# Patient Record
Sex: Female | Born: 1970 | Race: White | Hispanic: No | Marital: Married | Smoking: Former smoker
Health system: Southern US, Community
[De-identification: ages and names within clinical notes are randomized; demographics above are authoritative.]

## PROBLEM LIST (undated history)

## (undated) DIAGNOSIS — M545 Low back pain, unspecified: Secondary | ICD-10-CM

## (undated) DIAGNOSIS — F419 Anxiety disorder, unspecified: Secondary | ICD-10-CM

## (undated) DIAGNOSIS — I5189 Other ill-defined heart diseases: Secondary | ICD-10-CM

## (undated) DIAGNOSIS — Z91041 Radiographic dye allergy status: Secondary | ICD-10-CM

## (undated) DIAGNOSIS — R002 Palpitations: Secondary | ICD-10-CM

## (undated) DIAGNOSIS — G43909 Migraine, unspecified, not intractable, without status migrainosus: Secondary | ICD-10-CM

## (undated) DIAGNOSIS — I2510 Atherosclerotic heart disease of native coronary artery without angina pectoris: Secondary | ICD-10-CM

## (undated) DIAGNOSIS — J45909 Unspecified asthma, uncomplicated: Secondary | ICD-10-CM

## (undated) DIAGNOSIS — M719 Bursopathy, unspecified: Secondary | ICD-10-CM

## (undated) DIAGNOSIS — I10 Essential (primary) hypertension: Secondary | ICD-10-CM

## (undated) DIAGNOSIS — G8929 Other chronic pain: Secondary | ICD-10-CM

## (undated) DIAGNOSIS — I340 Nonrheumatic mitral (valve) insufficiency: Secondary | ICD-10-CM

## (undated) DIAGNOSIS — M779 Enthesopathy, unspecified: Secondary | ICD-10-CM

## (undated) DIAGNOSIS — Z9289 Personal history of other medical treatment: Secondary | ICD-10-CM

## (undated) DIAGNOSIS — L509 Urticaria, unspecified: Secondary | ICD-10-CM

## (undated) DIAGNOSIS — D649 Anemia, unspecified: Secondary | ICD-10-CM

## (undated) HISTORY — DX: Other ill-defined heart diseases: I51.89

## (undated) HISTORY — DX: Enthesopathy, unspecified: M77.9

## (undated) HISTORY — DX: Palpitations: R00.2

## (undated) HISTORY — DX: Radiographic dye allergy status: Z91.041

## (undated) HISTORY — DX: Urticaria, unspecified: L50.9

## (undated) HISTORY — DX: Bursopathy, unspecified: M71.9

## (undated) HISTORY — DX: Personal history of other medical treatment: Z92.89

## (undated) HISTORY — DX: Anxiety disorder, unspecified: F41.9

## (undated) HISTORY — DX: Nonrheumatic mitral (valve) insufficiency: I34.0

---
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
## 2015-12-29 ENCOUNTER — Other Ambulatory Visit: Payer: Self-pay | Admitting: Adult Health

## 2015-12-29 DIAGNOSIS — G43919 Migraine, unspecified, intractable, without status migrainosus: Secondary | ICD-10-CM

## 2015-12-30 ENCOUNTER — Ambulatory Visit: Payer: Self-pay | Admitting: Primary Care

## 2016-01-03 ENCOUNTER — Emergency Department

## 2016-01-03 ENCOUNTER — Encounter: Payer: Self-pay | Admitting: Emergency Medicine

## 2016-01-03 DIAGNOSIS — R51 Headache: Secondary | ICD-10-CM | POA: Insufficient documentation

## 2016-01-03 NOTE — ED Notes (Signed)
Is scheduled for mri on Friday for migraines but states this does not feel like it. Also states feels like she has a bright light shining in her rt eye like she can't see

## 2016-01-03 NOTE — ED Notes (Signed)
States pain around eye, ear, jaw, head

## 2016-01-03 NOTE — ED Notes (Signed)
On and off x 1 week - took 2 motrin, 2 sudafed with no relief

## 2016-01-04 ENCOUNTER — Emergency Department
Admission: EM | Admit: 2016-01-04 | Discharge: 2016-01-04 | Disposition: A | Discharge status: Home / Self Care | Attending: Emergency Medicine | Admitting: Emergency Medicine

## 2016-01-04 NOTE — ED Notes (Signed)
No answer called from lobby

## 2016-01-04 NOTE — ED Notes (Signed)
No answer when called from lobby 

## 2016-01-15 ENCOUNTER — Ambulatory Visit
Admission: RE | Admit: 2016-01-15 | Discharge: 2016-01-15 | Disposition: A | Discharge status: Home / Self Care | Source: Ambulatory Visit | Attending: Adult Health | Admitting: Adult Health

## 2016-01-15 DIAGNOSIS — G43919 Migraine, unspecified, intractable, without status migrainosus: Secondary | ICD-10-CM | POA: Diagnosis present

## 2016-01-15 MED ORDER — GADOBENATE DIMEGLUMINE 529 MG/ML IV SOLN
15.0000 mL | Freq: Once | INTRAVENOUS | Status: AC | PRN
  Administered 2016-01-15: 13 mL via INTRAVENOUS

## 2016-08-05 ENCOUNTER — Encounter (INDEPENDENT_AMBULATORY_CARE_PROVIDER_SITE_OTHER): Payer: Self-pay

## 2016-08-05 ENCOUNTER — Encounter: Payer: Self-pay | Admitting: Cardiovascular Disease

## 2016-08-05 ENCOUNTER — Ambulatory Visit (INDEPENDENT_AMBULATORY_CARE_PROVIDER_SITE_OTHER): Admitting: Cardiovascular Disease

## 2016-08-05 VITALS — BP 138/68 | HR 57 | Ht 66.5 in | Wt 142.2 lb

## 2016-08-05 DIAGNOSIS — R0602 Shortness of breath: Secondary | ICD-10-CM

## 2016-08-05 DIAGNOSIS — R9431 Abnormal electrocardiogram [ECG] [EKG]: Secondary | ICD-10-CM | POA: Diagnosis not present

## 2016-08-05 DIAGNOSIS — R079 Chest pain, unspecified: Secondary | ICD-10-CM | POA: Diagnosis not present

## 2016-08-05 NOTE — Progress Notes (Signed)
Cardiology Office Note   Date:  08/05/2016   ID:  Kim Villarreal, DOB Jul 23, 1971, MRN [id number]  PCP:  [facility]  Cardiologist:   [name], MD   Chief Complaint  Patient presents with  . other    ABN EKG c/o irregular heart beat and bilateral arm pain. Meds reveiwed verbally with pt.      History of Present Illness: Kim Villarreal is a 45 y.o. female who Presents for evaluation of chest pain. She reports prior history of palpitations due to possible PVCs. Otherwise no previous cardiac history. She is a previous smoker and quit about 10 years ago. She smoked for 15 years. She does have family history of coronary artery disease. She has no diabetes or hyperlipidemia. She is known to have history of anxiety. She noticed elevated blood pressure readings over the summertime. The highest reading was 156/74 in June. She exercises regularly at the gym. Recently, she had an episode of sudden shortness of breath which happened about 2 weeks ago and resolved without intervention. She started having symptoms of substernal chest pain with exercise. The first episode happened on the elliptical and she thought it was musculoskeletal. The pain was substernal radiating to both elbows and described as aching sensation. It then happened again while she was exercising on a treadmill and lasted for about 10 minutes. She continued to exercise and the symptoms resolved. She does report worsening anxiety.     Past Medical History:  Diagnosis Date  . Bursitis   . Tendonitis     Past Surgical History:  Procedure Laterality Date  . CESAREAN SECTION       Current Outpatient Prescriptions  Medication Sig Dispense Refill  . ALPRAZolam (XANAX) 0.25 MG tablet Take 0.25 mg by mouth daily as needed.     . Cholecalciferol (VITAMIN D-1000 MAX ST) 1000 units tablet Take 2,000 Units by mouth daily.     Marland Kitchen docusate sodium (COLACE) 100 MG capsule Take 100 mg by mouth every other day.    Marland Kitchen  MAGNESIUM PO Take by mouth daily.    . nitrofurantoin (MACRODANTIN) 25 MG capsule Take 25 mg by mouth as needed.      No current facility-administered medications for this visit.     Allergies:   Codeine    Social History:  The patient  reports that she has quit smoking. She quit after 15.00 years of use. She has never used smokeless tobacco. She reports that she drinks alcohol. She reports that she does not use drugs.   Family History:  The patient's family history includes Heart disease in her mother.    ROS:  Please see the history of present illness.   Otherwise, review of systems are positive for none.   All other systems are reviewed and negative.    PHYSICAL EXAM: VS:  BP 138/68 (BP Location: Right Arm, Patient Position: Sitting, Cuff Size: Normal)   Pulse (!) 57   Ht 5' 6.5" (1.689 m)   Wt 142 lb 4 oz (64.5 kg)   BMI 22.62 kg/m  , BMI Body mass index is 22.62 kg/m. GEN: Well nourished, well developed, in no acute distress  HEENT: normal  Neck: no JVD, carotid bruits, or masses Cardiac: RRR; no  rubs, or gallops,no edema . One out of 6 systolic ejection murmur in the aortic area. Respiratory:  clear to auscultation bilaterally, normal work of breathing GI: soft, nontender, nondistended, + BS MS: no deformity or atrophy  Skin: warm  and dry, no rash Neuro:  Strength and sensation are intact Psych: euthymic mood, full affect   EKG:  EKG is ordered today. The ekg ordered today demonstrates normal sinus rhythm with left axis deviation and poor R-wave progression in the precordial leads   Recent Labs: No results found for requested labs within last 8760 hours.    Lipid Panel No results found for: CHOL, TRIG, HDL, CHOLHDL, VLDL, LDLCALC, LDLDIRECT    Wt Readings from Last 3 Encounters:  08/05/16 142 lb 4 oz (64.5 kg)      PAD Screen 08/05/2016  Previous PAD dx? No  Previous surgical procedure? No  Pain with walking? No  Feet/toe relief with dangling? No    Painful, non-healing ulcers? No  Extremities discolored? No      ASSESSMENT AND PLAN:  1.  Chest pain with some anginal and some atypical features. It is somewhat concerning that some of her symptoms are happening with exercise. However, the symptoms resolved with continued exercise. She has a soft systolic murmur in the aortic area by exam. Baseline ECG is mildly abnormal with poor R-wave progression in the anterior leads with left axis deviation. Given her risk factors for coronary artery disease, I recommend evaluation with a treadmill nuclear stress test. Due to her abnormal baseline ECG, treadmill stress test alone is not sufficient. She has a faint heart murmur and given her symptoms, I requested an echocardiogram. Anxiety induced chest pain is a possibility but that's a diagnosis of exclusion.  2. Elevated blood pressure: Continue to monitor and if the numbers continue to be abnormal, consider treatment.    Disposition:   FU with me as needed.   Signed,  [name], MD  08/05/2016 4:55 PM    [facility]

## 2016-08-05 NOTE — Patient Instructions (Addendum)
Medication Instructions:  Your physician recommends that you continue on your current medications as directed. Please refer to the Current Medication list given to you today.   Labwork: none  Testing/Procedures: Your physician has requested that you have an echocardiogram. Echocardiography is a painless test that uses sound waves to create images of your heart. It provides your doctor with information about the size and shape of your heart and how well your heart's chambers and valves are working. This procedure takes approximately one hour. There are no restrictions for this procedure.  Your physician has requested that you have a lexiscan myoview. For further information please visit HugeFiesta.[email or web address]. Please follow instruction sheet, as given.  [place]  Your caregiver has ordered a Stress Test with nuclear imaging. The purpose of this test is to evaluate the blood supply to your heart muscle. This procedure is referred to as a "Non-Invasive Stress Test." This is because other than having an IV started in your vein, nothing is inserted or "invades" your body. Cardiac stress tests are done to find areas of poor blood flow to the heart by determining the extent of coronary artery disease (CAD). Some patients exercise on a treadmill, which naturally increases the blood flow to your heart, while others who are  unable to walk on a treadmill due to physical limitations have a pharmacologic/chemical stress agent called Lexiscan . This medicine will mimic walking on a treadmill by temporarily increasing your coronary blood flow.   Please note: these test may take anywhere between 2-4 hours to complete  PLEASE REPORT TO [facility] AT THE FIRST DESK WILL DIRECT YOU WHERE TO GO  Date of Procedure:___Sept 21________________________  Arrival Time for Procedure:___8:15am___________  Instructions regarding medication:   You may take your morning medications with  a sip of water.   PLEASE NOTIFY THE OFFICE AT LEAST 4 HOURS IN ADVANCE IF YOU ARE UNABLE TO KEEP YOUR APPOINTMENT.  [phone] AND  PLEASE NOTIFY NUCLEAR MEDICINE AT [facility] AT LEAST 24 HOURS IN ADVANCE IF YOU ARE UNABLE TO KEEP YOUR APPOINTMENT. [phone]  How to prepare for your Myoview test:  1. Do not eat or drink after midnight 2. No caffeine for 24 hours prior to test 3. No smoking 24 hours prior to test. 4. Your medication may be taken with water.  If your doctor stopped a medication because of this test, do not take that medication. 5. Ladies, please do not wear dresses.  Skirts or pants are appropriate. Please wear a short sleeve shirt. 6. No perfume, cologne or lotion. 7. Wear comfortable walking shoes. No heels!            Follow-Up: Your physician recommends that you schedule a follow-up appointment as needed.    Any Other Special Instructions Will Be Listed Below (If Applicable).     If you need a refill on your cardiac medications before your next appointment, please call your pharmacy.  Echocardiogram An echocardiogram, or echocardiography, uses sound waves (ultrasound) to produce an image of your heart. The echocardiogram is simple, painless, obtained within a short period of time, and offers valuable information to your health care provider. The images from an echocardiogram can provide information such as:  Evidence of coronary artery disease (CAD).  Heart size.  Heart muscle function.  Heart valve function.  Aneurysm detection.  Evidence of a past heart attack.  Fluid buildup around the heart.  Heart muscle thickening.  Assess heart valve function. [place]  CARE PROVIDER KNOW ABOUT:  Any allergies you have.  All medicines you are taking, including vitamins, herbs, eye drops, creams, and over-the-counter medicines.  Previous problems you or members of your family have had with the use of anesthetics.  Any blood disorders you  have.  Previous surgeries you have had.  Medical conditions you have.  Possibility of pregnancy, if this applies. BEFORE THE PROCEDURE  No special preparation is needed. Eat and drink normally.  PROCEDURE   In order to produce an image of your heart, gel will be applied to your chest and a wand-like tool (transducer) will be moved over your chest. The gel will help transmit the sound waves from the transducer. The sound waves will harmlessly bounce off your heart to allow the heart images to be captured in real-time motion. These images will then be recorded.  You may need an IV to receive a medicine that improves the quality of the pictures. AFTER THE PROCEDURE You may return to your normal schedule including diet, activities, and medicines, unless your health care provider tells you otherwise.   This information is not intended to replace advice given to you by your health care provider. Make sure you discuss any questions you have with your health care provider.   Document Released: 11/04/2000 Document Revised: 11/28/2014 Document Reviewed: 07/15/2013 Elsevier Interactive Patient Education 2016 [place]. Cardiac Nuclear Scanning A cardiac nuclear scan is used to check your heart for problems, such as the following:  A portion of the heart is not getting enough blood.  Part of the heart muscle has died, which happens with a heart attack.  The heart wall is not working normally.  In this test, a radioactive dye (tracer) is injected into your bloodstream. After the tracer has traveled to your heart, a scanning device is used to measure how much of the tracer is absorbed by or distributed to various areas of your heart. LET [facility] CARE PROVIDER KNOW ABOUT:  Any allergies you have.  All medicines you are taking, including vitamins, herbs, eye drops, creams, and over-the-counter medicines.  Previous problems you or members of your family have had with the use of  anesthetics.  Any blood disorders you have.  Previous surgeries you have had.  Medical conditions you have.  RISKS AND COMPLICATIONS Generally, this is a safe procedure. However, as with any procedure, problems can occur. Possible problems include:   Serious chest pain.  Rapid heartbeat.  Sensation of warmth in your chest. This usually passes quickly. BEFORE THE PROCEDURE Ask your health care provider about changing or stopping your regular medicines. PROCEDURE This procedure is usually done at a hospital and takes 2-4 hours.  An IV tube is inserted into one of your veins.  Your health care provider will inject a small amount of radioactive tracer through the tube.  You will then wait for 20-40 minutes while the tracer travels through your bloodstream.  You will lie down on an exam table so images of your heart can be taken. Images will be taken for about 15-20 minutes.  You will exercise on a treadmill or stationary bike. While you exercise, your heart activity will be monitored with an electrocardiogram (ECG), and your blood pressure will be checked.  If you are unable to exercise, you may be given a medicine to make your heart beat faster.  When blood flow to your heart has peaked, tracer will again be injected through the IV tube.  After 20-40 minutes, you will  get back on the exam table and have more images taken of your heart.  When the procedure is over, your IV tube will be removed. AFTER THE PROCEDURE  You will likely be able to leave shortly after the test. Unless your health care provider tells you otherwise, you may return to your normal schedule, including diet, activities, and medicines.  Make sure you find out how and when you will get your test results.   This information is not intended to replace advice given to you by your health care provider. Make sure you discuss any questions you have with your health care provider.   Document Released: 12/02/2004  Document Revised: 11/12/2013 Document Reviewed: 10/16/2013 Elsevier Interactive Patient Education [employer].

## 2016-08-09 ENCOUNTER — Ambulatory Visit: Admitting: Internal Medicine

## 2016-08-11 ENCOUNTER — Other Ambulatory Visit: Payer: Self-pay

## 2016-08-11 ENCOUNTER — Encounter
Admission: RE | Admit: 2016-08-11 | Discharge: 2016-08-11 | Disposition: A | Discharge status: Home / Self Care | Source: Ambulatory Visit | Attending: Cardiovascular Disease | Admitting: Cardiovascular Disease

## 2016-08-11 DIAGNOSIS — I10 Essential (primary) hypertension: Secondary | ICD-10-CM

## 2016-08-11 DIAGNOSIS — R079 Chest pain, unspecified: Secondary | ICD-10-CM

## 2016-08-11 LAB — NM MYOCAR MULTI W/SPECT W/WALL MOTION / EF
CHL CUP NUCLEAR SRS: 2
CHL CUP RESTING HR STRESS: 60 {beats}/min
CHL CUP STRESS STAGE 1 GRADE: 0.1 %
CHL CUP STRESS STAGE 1 SPEED: 0 mph
CHL CUP STRESS STAGE 2 DBP: 62 mmHg
CHL CUP STRESS STAGE 2 HR: 97 {beats}/min
CHL CUP STRESS STAGE 3 DBP: 59 mmHg
CHL CUP STRESS STAGE 3 SBP: 201 mmHg
CHL CUP STRESS STAGE 3 SPEED: 2.5 mph
CHL CUP STRESS STAGE 4 DBP: 72 mmHg
CHL CUP STRESS STAGE 4 GRADE: 14 %
CHL CUP STRESS STAGE 5 SPEED: 0 mph
CHL CUP STRESS STAGE 6 GRADE: 0 %
CHL CUP STRESS STAGE 6 HR: 76 {beats}/min
CHL CUP STRESS STAGE 6 SBP: 161 mmHg
CSEPED: 9 min
CSEPEDS: 0 s
CSEPHR: 89 %
CSEPPBP: 219 mmHg
CSEPPHR: 157 {beats}/min
Estimated workload: 10.1 METS
LV dias vol: 68 mL (ref 46–106)
LVSYSVOL: 17 mL
NUC STRESS TID: 0.85
Percent of predicted max HR: 89 %
SDS: 0
SSS: 2
Stage 1 HR: 61 {beats}/min
Stage 2 Grade: 10 %
Stage 2 SBP: 167 mmHg
Stage 2 Speed: 1.7 mph
Stage 3 Grade: 12 %
Stage 3 HR: 126 {beats}/min
Stage 4 HR: 157 {beats}/min
Stage 4 SBP: 219 mmHg
Stage 4 Speed: 3.4 mph
Stage 5 Grade: 0 %
Stage 5 HR: 116 {beats}/min
Stage 6 DBP: 87 mmHg
Stage 6 Speed: 0 mph

## 2016-08-11 MED ORDER — TECHNETIUM TC 99M TETROFOSMIN IV KIT
12.9200 | PACK | Freq: Once | INTRAVENOUS | Status: AC | PRN
  Administered 2016-08-11: 12.92 via INTRAVENOUS

## 2016-08-11 MED ORDER — LOSARTAN POTASSIUM 25 MG PO TABS: 25.0000 mg | ORAL_TABLET | Freq: Every day | ORAL | 3 refills | Status: DC

## 2016-08-11 MED ORDER — TECHNETIUM TC 99M TETROFOSMIN IV KIT
31.4520 | PACK | Freq: Once | INTRAVENOUS | Status: AC | PRN
  Administered 2016-08-11: 31.452 via INTRAVENOUS

## 2016-08-12 ENCOUNTER — Telehealth: Payer: Self-pay

## 2016-08-12 NOTE — Telephone Encounter (Signed)
lmom to have pt call and schedule appt

## 2016-08-12 NOTE — Telephone Encounter (Signed)
-----   Message from Georgiana Shore, RN sent at 08/11/2016  5:30 PM EDT ----- Could you please call her for 2 month f/u w/Dr. Fletcher Anon? Pt aware you guys will be calling. Thanks!

## 2016-08-22 ENCOUNTER — Other Ambulatory Visit

## 2016-08-24 ENCOUNTER — Other Ambulatory Visit: Payer: Self-pay

## 2016-08-24 ENCOUNTER — Other Ambulatory Visit

## 2016-08-24 ENCOUNTER — Ambulatory Visit (INDEPENDENT_AMBULATORY_CARE_PROVIDER_SITE_OTHER)

## 2016-08-24 DIAGNOSIS — R0602 Shortness of breath: Secondary | ICD-10-CM

## 2016-08-29 ENCOUNTER — Other Ambulatory Visit

## 2016-08-30 ENCOUNTER — Telehealth: Payer: Self-pay | Admitting: Cardiovascular Disease

## 2016-08-30 NOTE — Telephone Encounter (Signed)
Losartan added for elevated BP during treadmill myoview. Pt did not show for f/u 10/9 labs. Left detailed message regarding rescheduling labs along w/CB number on pt cell VM

## 2016-09-22 ENCOUNTER — Telehealth: Payer: Self-pay | Admitting: Cardiovascular Disease

## 2016-09-22 NOTE — Telephone Encounter (Signed)
Pt states every time she does activity she has severe chest pain that shoots down both arms and into her neck an down her back. States she only has this pain when she exercises. Please call.

## 2016-09-22 NOTE — Telephone Encounter (Signed)
S/w pt who reports pains from tip of fingers, radiating through both arms, across chest and down her back anytime she exerts herself. Sx resolve w/rest.   She was seen in the office Sept 15 for chest pain that would begin during exertion but would resolve during exercise. Echo and myoview ordered. BP was elevated during myoview and losartan 25mg  qd added. She has been taking this until 11/29 when she d/c'd it as she thought this was contributing to her refux. When reflux sx did not improve, she resumed losartan. BP 120s/80s w/medication.  She was cleared to resume normal activities on 10/5 by Dr. Fletcher Anon The pains in her arms, chest, and back are getting progressively worse. States stress makes it worse. PCP thought it to be inflammation in her cartilage but pt states she is sure it can not be this and did not want prednisone that MD recommended She took prilosec x 10 days with no improvement.  She has "several attacks Saturday, sweating from head to toe lasting a long time, about 1/2 hour each." She would like a sooner appt and is agreeable to see PA/NP. Reviewed s/s that would require immediate attention in an ER setting. She verbalized understanding and is agreeable w/plan. Forward to scheduling for sooner appt.

## 2016-09-26 ENCOUNTER — Other Ambulatory Visit

## 2016-09-26 DIAGNOSIS — I10 Essential (primary) hypertension: Secondary | ICD-10-CM

## 2016-09-27 LAB — BASIC METABOLIC PANEL
BUN / CREAT RATIO: 10 (ref 9–23)
BUN: 9 mg/dL (ref 6–24)
CO2: 23 mmol/L (ref 18–29)
CREATININE: 0.86 mg/dL (ref 0.57–1.00)
Calcium: 10.2 mg/dL (ref 8.7–10.2)
Chloride: 101 mmol/L (ref 96–106)
GFR, EST AFRICAN AMERICAN: 95 mL/min/{1.73_m2} (ref >59)
GFR, EST NON AFRICAN AMERICAN: 82 mL/min/{1.73_m2} (ref >59)
Glucose: 101 mg/dL — ABNORMAL HIGH (ref 65–99)
POTASSIUM: 4.9 mmol/L (ref 3.5–5.2)
SODIUM: 141 mmol/L (ref 134–144)

## 2016-09-29 ENCOUNTER — Encounter: Payer: Self-pay | Admitting: Physician Assistant

## 2016-10-03 ENCOUNTER — Other Ambulatory Visit
Admission: RE | Admit: 2016-10-03 | Discharge: 2016-10-03 | Disposition: A | Discharge status: Home / Self Care | Source: Ambulatory Visit | Attending: Physician Assistant | Admitting: Physician Assistant

## 2016-10-03 ENCOUNTER — Encounter: Payer: Self-pay | Admitting: Physician Assistant

## 2016-10-03 ENCOUNTER — Other Ambulatory Visit: Payer: Self-pay | Admitting: Physician Assistant

## 2016-10-03 ENCOUNTER — Ambulatory Visit (INDEPENDENT_AMBULATORY_CARE_PROVIDER_SITE_OTHER): Admitting: Physician Assistant

## 2016-10-03 VITALS — BP 124/60 | HR 78 | Ht 66.5 in | Wt 141.5 lb

## 2016-10-03 DIAGNOSIS — Z01810 Encounter for preprocedural cardiovascular examination: Secondary | ICD-10-CM

## 2016-10-03 DIAGNOSIS — R079 Chest pain, unspecified: Secondary | ICD-10-CM

## 2016-10-03 DIAGNOSIS — Z79899 Other long term (current) drug therapy: Secondary | ICD-10-CM | POA: Diagnosis not present

## 2016-10-03 DIAGNOSIS — I10 Essential (primary) hypertension: Secondary | ICD-10-CM | POA: Diagnosis not present

## 2016-10-03 DIAGNOSIS — Z01812 Encounter for preprocedural laboratory examination: Secondary | ICD-10-CM | POA: Diagnosis present

## 2016-10-03 DIAGNOSIS — I200 Unstable angina: Secondary | ICD-10-CM | POA: Insufficient documentation

## 2016-10-03 LAB — PROTIME-INR
INR: 0.93
PROTHROMBIN TIME: 12.5 s (ref 11.4–15.2)

## 2016-10-03 LAB — BASIC METABOLIC PANEL
Anion gap: 9 (ref 5–15)
BUN: 14 mg/dL (ref 6–20)
CALCIUM: 10.1 mg/dL (ref 8.9–10.3)
CHLORIDE: 101 mmol/L (ref 101–111)
CO2: 29 mmol/L (ref 22–32)
CREATININE: 0.86 mg/dL (ref 0.44–1.00)
GFR calc Af Amer: >60 mL/min (ref >60)
GFR calc non Af Amer: >60 mL/min (ref >60)
GLUCOSE: 106 mg/dL — AB (ref 65–99)
Potassium: 3.7 mmol/L (ref 3.5–5.1)
Sodium: 139 mmol/L (ref 135–145)

## 2016-10-03 LAB — CBC WITH DIFFERENTIAL/PLATELET
Basophils Absolute: 0.1 10*3/uL (ref 0–0.1)
Basophils Relative: 1 %
Eosinophils Absolute: 0.1 10*3/uL (ref 0–0.7)
Eosinophils Relative: 1 %
HEMATOCRIT: 44.1 % (ref 35.0–47.0)
HEMOGLOBIN: 14.7 g/dL (ref 12.0–16.0)
LYMPHS ABS: 1.8 10*3/uL (ref 1.0–3.6)
LYMPHS PCT: 20 %
MCH: 27.4 pg (ref 26.0–34.0)
MCHC: 33.3 g/dL (ref 32.0–36.0)
MCV: 82.4 fL (ref 80.0–100.0)
MONO ABS: 0.6 10*3/uL (ref 0.2–0.9)
MONOS PCT: 6 %
NEUTROS ABS: 6.5 10*3/uL (ref 1.4–6.5)
Neutrophils Relative %: 72 %
Platelets: 177 10*3/uL (ref 150–440)
RBC: 5.35 MIL/uL — ABNORMAL HIGH (ref 3.80–5.20)
RDW: 14.3 % (ref 11.5–14.5)
WBC: 8.9 10*3/uL (ref 3.6–11.0)

## 2016-10-03 MED ORDER — ASPIRIN EC 81 MG PO TBEC: 81.0000 mg | DELAYED_RELEASE_TABLET | Freq: Every day | ORAL | 3 refills | Status: DC

## 2016-10-03 MED ORDER — ISOSORBIDE MONONITRATE ER 30 MG PO TB24: 15.0000 mg | ORAL_TABLET | Freq: Every day | ORAL | 3 refills | Status: DC

## 2016-10-03 NOTE — Progress Notes (Signed)
Cardiac cath orders placed. Please see OV from 10/03/16.

## 2016-10-03 NOTE — Patient Instructions (Addendum)
Medication Instructions:  Your physician has recommended you make the following change in your medication:  START taking Imdur 15mg  once daily START taking aspirin 81mg  once daily   Labwork: BMET, CBC, PT/INR  Testing/Procedures: A chest x-ray takes a picture of the organs and structures inside the chest, including the heart, lungs, and blood vessels. This test can show several things, including, whether the heart is enlarges; whether fluid is building up in the lungs; and whether pacemaker / defibrillator leads are still in place.  Your physician has requested that you have a cardiac catheterization. Cardiac catheterization is used to diagnose and/or treat various heart conditions. Doctors may recommend this procedure for a number of different reasons. The most common reason is to evaluate chest pain. Chest pain can be a symptom of coronary artery disease (CAD), and cardiac catheterization can show whether plaque is narrowing or blocking your heart's arteries. This procedure is also used to evaluate the valves, as well as measure the blood flow and oxygen levels in different parts of your heart. For further information please visit HugeFiesta.tn. Please follow instruction sheet, as given.  [facility] Cardiac Cath Instructions   You are scheduled for a Cardiac Cath on: Wednesday, November 15  Please arrive at 11am on the day of your procedure  Please expect a call from our [place] to pre-register you  Do not eat/drink anything after midnight  Someone will need to drive you home  It is recommended someone be with you for the first 24 hours after your procedure  Wear clothes that are easy to get on/off and wear slip on shoes if possible   Medications bring a current list of all medications with you    _xx__ Do not take these medications before your procedure: Do not take losartan the morning of your procedure.   Day of your procedure: [facility] [place] [phone]  Arrival date: Wednesday, November 15 Arrival time:   The usual length of stay after your procedure is about 2 to 3 hours.  This can vary.  If you have any questions, please call our office at [phone], or you may call the cardiac cath lab at [facility] directly at [phone]  Follow-Up: Your physician recommends that you schedule a follow-up appointment 1-2 weeks after cath.    Any Other Special Instructions Will Be Listed Below (If Applicable).     If you need a refill on your cardiac medications before your next appointment, please call your pharmacy.   Angiogram An angiogram, also called angiography, is a procedure used to look at the blood vessels. In this procedure, dye is injected through a long, thin tube (catheter) into an artery. X-rays are then taken. The X-rays will show if there is a blockage or problem in a blood vessel.  LET [facility] CARE PROVIDER KNOW ABOUT:  Any allergies you have, including allergies to shellfish or contrast dye.   All medicines you are taking, including vitamins, herbs, eye drops, creams, and over-the-counter medicines.   Previous problems you or members of your family have had with the use of anesthetics.   Any blood disorders you have.   Previous surgeries you have had.  Any previous kidney problems or failure you have had.  Medical conditions you have.   Possibility of pregnancy, if this applies. RISKS AND COMPLICATIONS Generally, an angiogram is a safe procedure. However, as with any procedure, problems can occur. Possible problems include:  Injury to the  blood vessels, including rupture or bleeding.  Infection or bruising at the catheter site.  Allergic reaction to the dye or contrast used.  Kidney damage from the dye or contrast used.  Blood clots that can lead to a stroke or heart attack. BEFORE THE PROCEDURE  Do not eat or drink after midnight on the  night before the procedure, or as directed by your health care provider.   Ask your health care provider if you may drink enough water to take any needed medicines the morning of the procedure.  PROCEDURE  You may be given a medicine to help you relax (sedative) before and during the procedure. This medicine is given through an IV access tube that is inserted into one of your veins.   The area where the catheter will be inserted will be washed and shaved. This is usually done in the groin but may be done in the fold of your arm (near your elbow) or in the wrist.  A medicine will be given to numb the area where the catheter will be inserted (local anesthetic).  The catheter will be inserted with a guide wire into an artery. The catheter is guided by using a type of X-ray (fluoroscopy) to the blood vessel being examined.   Dye is then injected into the catheter, and X-rays are taken. The dye helps to show where any narrowing or blockages are located.  AFTER THE PROCEDURE   If the procedure is done through the leg, you will be kept in bed lying flat for several hours. You will be instructed to not bend or cross your legs.  The insertion site will be checked frequently.  The pulse in your feet or wrist will be checked frequently.  Additional blood tests, X-rays, and electrocardiography may be done.   You may need to stay in the hospital overnight for observation.    This information is not intended to replace advice given to you by your health care provider. Make sure you discuss any questions you have with your health care provider.   Document Released: 08/17/2005 Document Revised: 11/28/2014 Document Reviewed: 04/10/2013 Elsevier Interactive Patient Education 2016 [place] After These instructions give you information about caring for yourself after your procedure. Your doctor may also give you more specific instructions. Call your doctor if you have any  problems or questions after your procedure.  HOME CARE  Take medicines only as told by your doctor.  Follow your doctor's instructions about:  Care of the area where the tube was inserted.  Bandage (dressing) changes and removal.  You may shower 24-48 hours after the procedure or as told by your doctor.  Do not take baths, swim, or use a hot tub until your doctor approves.  Every day, check the area where the tube was inserted. Watch for:  Redness, swelling, or pain.  Fluid, blood, or pus.  Do not apply powder or lotion to the site.  Do not lift anything that is heavier than 10 lb (4.5 kg) for 5 days or as told by your doctor.  Ask your doctor when you can:  Return to work or school.  Do physical activities or play sports.  Have sex.  Do not drive or operate heavy machinery for 24 hours or as told by your doctor.  Have someone with you for the first 24 hours after the procedure.  Keep all follow-up visits as told by your doctor. This is important. GET HELP IF:  You have  a fever.   You have chills.   You have more bleeding from the area where the tube was inserted. Hold pressure on the area.  You have redness, swelling, or pain in the area where the tube was inserted.  You have fluid or pus coming from the area. GET HELP RIGHT AWAY IF:   You have a lot of pain in the area where the tube was inserted.  The area where the tube was inserted is bleeding, and the bleeding does not stop after 30 minutes of holding steady pressure on the area.  The area near or just beyond the insertion site becomes pale, cool, tingly, or numb.   This information is not intended to replace advice given to you by your health care provider. Make sure you discuss any questions you have with your health care provider.   Document Released: 02/03/2009 Document Revised: 11/28/2014 Document Reviewed: 04/10/2013 Elsevier Interactive Patient Education [employer].

## 2016-10-03 NOTE — Progress Notes (Signed)
Cardiology Office Note Date:  10/03/2016  Patient ID:  Kim Villarreal, DOB Feb 22, 1971, MRN [id number] PCP:  [place]  Cardiologist:  Dr. Fletcher Anon, MD    Chief Complaint: Follow up chest pain  History of Present Illness: Kim Villarreal is a 45 y.o. female with history of palpitations with possible PVCs, prior tobacco abuse beginning at age 67 quitting approximately 10 years ago, anxiety, and family history of CAD who was recently seen on 08/05/2016 for evaulation of chest pain with exercise and SOB. Pain that time was substernal and radiated down her bilateral arms to her elbows. BP had been somewhat elevated in the Q000111Q systolic range. She also noted increased anxiety at the same time. She underwent echo on 08/24/16 that showed a vigorous LV systolic function with an EF of 65-70%, normal wall motion, normal LV diastolic function, and mild mitral regurgitation. She also underwent a treadmill Myoview on 08/11/2016 that showed no significant ischemia, normal wall motion, no EKG changes concerning for ischemia, small region of fixed perfusion defect in the mid to apical anteroseptal region c/w breast attenuation artifact, peak BP of 219/72, overall low-risk study. She was started on losartan given elevated BP. She followed up with PCP on 10/25 noting continued chest pain that involved her entire chest and radiated down her arms. There was associated decreased appetite. Pain was worse with movement and had tried Prilosec OTC without relief. Thought was possible MSK etiology and she was prescribed prednisone, though the patient did not take this. CXR through PCP was reportedly negative (unable to see overread in Care Everywhere). She has continued to have chest pain with associated diaphoresis. BP has been well controlled since starting losartan. She has an appointment with GI in early December.  She has noted progressive worsening of chest pain that is now much worse with exertion and as well as  occurring as rest and when laying supine. She notes dull chest pain along her upper chest that radiates along both sides of her chest and down her bilateral arms. Previously with some associated diaphoresis, though none recently. When exercising pain will develop within one minute of exercise and last until she rests. Pain will improve with approximately 1 minute of rest. When she has pain at rest now it will last approximately 5 minutes. PPI, H2 blockers, or TUMs have not helped. Currently without chest pain.    Past Medical History:  Diagnosis Date  . Anxiety   . Bursitis   . History of nuclear stress test    a. 07/2016: no sig ischemia, nl wall motion, EF 72%, small defect along mid to apical anteroseptal wall c/w breast attenuation artifact, low risk study  . Mitral regurgitation    a. echo 08/2016: vigorous EF 65-70%, nl wall motion, nl LV diastolic fxn, mild MR  . Tendonitis     Past Surgical History:  Procedure Laterality Date  . CESAREAN SECTION      Current Outpatient Prescriptions  Medication Sig Dispense Refill  . Cholecalciferol (VITAMIN D-1000 MAX ST) 1000 units tablet Take 2,000 Units by mouth daily.     Marland Kitchen losartan (COZAAR) 25 MG tablet Take 1 tablet (25 mg total) by mouth daily. 30 tablet 3  . magnesium gluconate (MAGONATE) 1000 (54 Mg) MG/5ML syrup Take 400 mg by mouth daily before breakfast.    . nitrofurantoin (MACRODANTIN) 25 MG capsule Take 25 mg by mouth as needed.      No current facility-administered medications for this visit.  Allergies:   Codeine   Social History:  The patient  reports that she has quit smoking. She quit after 15.00 years of use. She has never used smokeless tobacco. She reports that she drinks alcohol. She reports that she does not use drugs.   Family History:  The patient's family history includes Heart disease in her mother.  ROS:   Review of Systems  Constitutional: Positive for diaphoresis and malaise/fatigue. Negative for chills,  fever and weight loss.  HENT: Negative for congestion.   Eyes: Negative for discharge and redness.  Respiratory: Negative for cough, hemoptysis, sputum production, shortness of breath and wheezing.   Cardiovascular: Positive for chest pain and palpitations. Negative for orthopnea, claudication, leg swelling and PND.  Gastrointestinal: Negative for abdominal pain, blood in stool, heartburn, melena, nausea and vomiting.  Genitourinary: Negative for hematuria.  Musculoskeletal: Negative for falls and myalgias.  Skin: Negative for rash.  Neurological: Positive for weakness. Negative for dizziness, tingling, tremors, sensory change, speech change, focal weakness and loss of consciousness.  Endo/Heme/Allergies: Does not bruise/bleed easily.  Psychiatric/Behavioral: Negative for substance abuse. The patient is not nervous/anxious.   All other systems reviewed and are negative.    PHYSICAL EXAM:  VS:  BP 124/60 (BP Location: Left Arm, Patient Position: Sitting, Cuff Size: Normal)   Pulse 78   Ht 5' 6.5" (1.689 m)   Wt 141 lb 8 oz (64.2 kg)   BMI 22.50 kg/m  BMI: Body mass index is 22.5 kg/m.  Physical Exam  Constitutional: She is oriented to person, place, and time. She appears well-developed and well-nourished.  HENT:  Head: Normocephalic and atraumatic.  Eyes: Right eye exhibits no discharge. Left eye exhibits no discharge.  Neck: Normal range of motion. No JVD present.  Cardiovascular: Normal rate, regular rhythm, S1 normal, S2 normal and normal heart sounds.  Exam reveals no distant heart sounds, no friction rub, no midsystolic click and no opening snap.   No murmur heard. Pulmonary/Chest: Effort normal and breath sounds normal. No respiratory distress. She has no decreased breath sounds. She has no wheezes. She has no rales. She exhibits no tenderness.  Abdominal: Soft. She exhibits no distension. There is no tenderness.  Musculoskeletal: She exhibits no edema.  Neurological: She is  alert and oriented to person, place, and time.  Skin: Skin is warm and dry. No cyanosis. Nails show no clubbing.  Psychiatric: She has a normal mood and affect. Her speech is normal and behavior is normal. Judgment and thought content normal.    EKG:  Was ordered and interpreted by me today. Shows NSR, 76 bpm, nonspecific lateral st/t changes   Recent Labs: 09/26/2016: BUN 9; Creatinine, Ser 0.86; Potassium 4.9; Sodium 141  No results found for requested labs within last 8760 hours.   Estimated Creatinine Clearance: 79.7 mL/min (by C-G formula based on SCr of 0.86 mg/dL).   Wt Readings from Last 3 Encounters:  10/03/16 141 lb 8 oz (64.2 kg)  08/05/16 142 lb 4 oz (64.5 kg)     Other studies reviewed: Additional studies/records reviewed today include: summarized above  ASSESSMENT AND PLAN:  1. Unstable angina: Symptoms progressive to now occurring at rest and possibly with angina decubitus. Symptoms continue to worsen and occur daily at this time with some episodes of 10/10 pain that radiate down the bilateral arms. Given persistent to worsening symptoms since her treadmill Myoview we discussed further evaluation options including cardiac cath and coronary CT. It was decided upon for her to undergo LHC  at [facility] with Dr. Fletcher Anon, MD. Start ASA 81 mg daily and Imdur 15 mg daily. Should she develop headache with Imdur will change to Ranexa. Discussed with Dr. Fletcher Anon, MD. Risks and benefits of cardiac catheterization have been discussed with the patient including risks of bleeding, bruising, infection, kidney damage, stroke, heart attack, and death. The patient understands these risks and is willing to proceed with the procedure. All questions have been answered and concerns listened to.   2. HTN: Well controlled. Continue losartan 25 mg daily. Add Imdur 15 mg daily as above.   Disposition: F/u with Dr. Fletcher Anon, MD or myself s/p LHC.    Current medicines are reviewed at length with the patient today.   The patient did not have any concerns regarding medicines.  Melvern Banker PA-C 10/03/2016 2:52 PM     [place], [address] [address] [phone]

## 2016-10-04 ENCOUNTER — Telehealth: Payer: Self-pay | Admitting: Physician Assistant

## 2016-10-04 ENCOUNTER — Encounter: Payer: Self-pay | Admitting: Physician Assistant

## 2016-10-04 NOTE — Progress Notes (Signed)
CXR reviewed from 08/2016. Negative. [place] for cardiac cath.

## 2016-10-04 NOTE — Telephone Encounter (Addendum)
Pt in office yesterday. Scheduled cardiac catheterization on Nov 15. Pt stated she had CXR October 2018 at [facility] [place]. Lars Pinks, RN s/w Anderson [place] at [facility] on November 13 and requested CXR to be faxed to our office. I s/w Jillian today as we did not receive information. She states the request was sent to Adonis Huguenin, RN, in their office but they will also need a faxed request. Explained need for this information today to [facility] who verbalized understanding. Faxed request to [phone].  CXR received and scanned in to patient chart by Caleen Essex Message to Christell Faith, PA-C

## 2016-10-05 ENCOUNTER — Encounter (HOSPITAL_COMMUNITY)
Admission: RE | Disposition: A | Discharge status: Home / Self Care | Payer: Self-pay | Source: Ambulatory Visit | Attending: Cardiovascular Disease

## 2016-10-05 ENCOUNTER — Encounter (HOSPITAL_COMMUNITY): Payer: Self-pay | Admitting: General Practice

## 2016-10-05 ENCOUNTER — Ambulatory Visit (HOSPITAL_COMMUNITY)
Admission: RE | Admit: 2016-10-05 | Discharge: 2016-10-06 | Disposition: A | Discharge status: Home / Self Care | Source: Ambulatory Visit | Attending: Cardiovascular Disease | Admitting: Cardiovascular Disease

## 2016-10-05 DIAGNOSIS — Z87891 Personal history of nicotine dependence: Secondary | ICD-10-CM | POA: Diagnosis not present

## 2016-10-05 DIAGNOSIS — Z8249 Family history of ischemic heart disease and other diseases of the circulatory system: Secondary | ICD-10-CM | POA: Diagnosis not present

## 2016-10-05 DIAGNOSIS — Z79899 Other long term (current) drug therapy: Secondary | ICD-10-CM | POA: Diagnosis not present

## 2016-10-05 DIAGNOSIS — I10 Essential (primary) hypertension: Secondary | ICD-10-CM

## 2016-10-05 DIAGNOSIS — I25110 Atherosclerotic heart disease of native coronary artery with unstable angina pectoris: Secondary | ICD-10-CM | POA: Diagnosis not present

## 2016-10-05 DIAGNOSIS — I340 Nonrheumatic mitral (valve) insufficiency: Secondary | ICD-10-CM | POA: Diagnosis not present

## 2016-10-05 DIAGNOSIS — I200 Unstable angina: Secondary | ICD-10-CM

## 2016-10-05 DIAGNOSIS — Z955 Presence of coronary angioplasty implant and graft: Secondary | ICD-10-CM

## 2016-10-05 DIAGNOSIS — I2510 Atherosclerotic heart disease of native coronary artery without angina pectoris: Secondary | ICD-10-CM

## 2016-10-05 HISTORY — DX: Migraine, unspecified, not intractable, without status migrainosus: G43.909

## 2016-10-05 HISTORY — PX: CARDIAC CATHETERIZATION: SHX172

## 2016-10-05 HISTORY — DX: Atherosclerotic heart disease of native coronary artery without angina pectoris: I25.10

## 2016-10-05 HISTORY — DX: Anemia, unspecified: D64.9

## 2016-10-05 HISTORY — DX: Essential (primary) hypertension: I10

## 2016-10-05 LAB — HCG, SERUM, QUALITATIVE: Preg, Serum: NEGATIVE

## 2016-10-05 LAB — POCT ACTIVATED CLOTTING TIME: Activated Clotting Time: 538 seconds

## 2016-10-05 SURGERY — LEFT HEART CATH AND CORONARY ANGIOGRAPHY
Anesthesia: Moderate Sedation

## 2016-10-05 SURGERY — LEFT HEART CATH AND CORONARY ANGIOGRAPHY

## 2016-10-05 MED ORDER — DIPHENHYDRAMINE HCL 50 MG/ML IJ SOLN
INTRAMUSCULAR | Status: AC
  Filled 2016-10-05: qty 1

## 2016-10-05 MED ORDER — FENTANYL CITRATE (PF) 100 MCG/2ML IJ SOLN
INTRAMUSCULAR | Status: DC | PRN
  Administered 2016-10-05: 50 ug via INTRAVENOUS
  Administered 2016-10-05: 25 ug via INTRAVENOUS

## 2016-10-05 MED ORDER — CLOPIDOGREL BISULFATE 300 MG PO TABS
ORAL_TABLET | ORAL | Status: AC
  Filled 2016-10-05: qty 1

## 2016-10-05 MED ORDER — CLOPIDOGREL BISULFATE 300 MG PO TABS
ORAL_TABLET | ORAL | Status: DC | PRN
  Administered 2016-10-05: 600 mg via ORAL

## 2016-10-05 MED ORDER — ONDANSETRON HCL 4 MG/2ML IJ SOLN: 4.0000 mg | Freq: Four times a day (QID) | INTRAMUSCULAR | Status: DC | PRN

## 2016-10-05 MED ORDER — SODIUM CHLORIDE 0.9% FLUSH: 3.0000 mL | INTRAVENOUS | Status: DC | PRN

## 2016-10-05 MED ORDER — IOPAMIDOL (ISOVUE-370) INJECTION 76%
INTRAVENOUS | Status: AC
Start: 2016-10-05 — End: 2016-10-05
  Filled 2016-10-05: qty 100

## 2016-10-05 MED ORDER — ASPIRIN 81 MG PO CHEW
81.0000 mg | CHEWABLE_TABLET | ORAL | Status: AC
  Administered 2016-10-05: 81 mg via ORAL

## 2016-10-05 MED ORDER — LIDOCAINE HCL (PF) 1 % IJ SOLN
INTRAMUSCULAR | Status: AC
  Filled 2016-10-05: qty 30

## 2016-10-05 MED ORDER — NITROGLYCERIN 1 MG/10 ML FOR IR/CATH LAB
INTRA_ARTERIAL | Status: AC
  Filled 2016-10-05: qty 10

## 2016-10-05 MED ORDER — ZOLPIDEM TARTRATE 5 MG PO TABS
5.0000 mg | ORAL_TABLET | Freq: Every evening | ORAL | Status: DC | PRN
  Administered 2016-10-05: 5 mg via ORAL
  Filled 2016-10-05: qty 1

## 2016-10-05 MED ORDER — METHYLPREDNISOLONE SODIUM SUCC 125 MG IJ SOLR
INTRAMUSCULAR | Status: DC | PRN
  Administered 2016-10-05: 125 mg via INTRAVENOUS

## 2016-10-05 MED ORDER — HEPARIN (PORCINE) IN NACL 2-0.9 UNIT/ML-% IJ SOLN
INTRAMUSCULAR | Status: DC | PRN
  Administered 2016-10-05: 1000 mL

## 2016-10-05 MED ORDER — FENTANYL CITRATE (PF) 100 MCG/2ML IJ SOLN
INTRAMUSCULAR | Status: AC
  Filled 2016-10-05: qty 2

## 2016-10-05 MED ORDER — ACETAMINOPHEN 325 MG PO TABS: 650.0000 mg | ORAL_TABLET | ORAL | Status: DC | PRN

## 2016-10-05 MED ORDER — METHYLPREDNISOLONE SODIUM SUCC 125 MG IJ SOLR
INTRAMUSCULAR | Status: AC
  Filled 2016-10-05: qty 2

## 2016-10-05 MED ORDER — VERAPAMIL HCL 2.5 MG/ML IV SOLN
INTRAVENOUS | Status: AC
  Filled 2016-10-05: qty 2

## 2016-10-05 MED ORDER — MIDAZOLAM HCL 2 MG/2ML IJ SOLN
INTRAMUSCULAR | Status: DC | PRN
  Administered 2016-10-05 (×2): 1 mg via INTRAVENOUS

## 2016-10-05 MED ORDER — CLOPIDOGREL BISULFATE 75 MG PO TABS
75.0000 mg | ORAL_TABLET | Freq: Every day | ORAL | Status: DC
  Administered 2016-10-06: 75 mg via ORAL
  Filled 2016-10-05: qty 1

## 2016-10-05 MED ORDER — NITROGLYCERIN 1 MG/10 ML FOR IR/CATH LAB
INTRA_ARTERIAL | Status: DC | PRN
  Administered 2016-10-05 (×3): 200 ug via INTRA_ARTERIAL

## 2016-10-05 MED ORDER — VITAMIN D3 25 MCG (1000 UNIT) PO TABS
2000.0000 [IU] | ORAL_TABLET | Freq: Every day | ORAL | Status: DC
  Administered 2016-10-05: 2000 [IU] via ORAL
  Filled 2016-10-05 (×3): qty 2

## 2016-10-05 MED ORDER — HEPARIN (PORCINE) IN NACL 2-0.9 UNIT/ML-% IJ SOLN
INTRAMUSCULAR | Status: AC
  Filled 2016-10-05: qty 1000

## 2016-10-05 MED ORDER — ATORVASTATIN CALCIUM 40 MG PO TABS
40.0000 mg | ORAL_TABLET | Freq: Every day | ORAL | Status: DC
  Administered 2016-10-05: 40 mg via ORAL
  Filled 2016-10-05 (×2): qty 1

## 2016-10-05 MED ORDER — SODIUM CHLORIDE 0.9% FLUSH: 3.0000 mL | Freq: Two times a day (BID) | INTRAVENOUS | Status: DC

## 2016-10-05 MED ORDER — ANGIOPLASTY BOOK
Freq: Once | Status: AC
  Administered 2016-10-05: 21:00:00
  Filled 2016-10-05: qty 1

## 2016-10-05 MED ORDER — LIDOCAINE HCL (PF) 1 % IJ SOLN
INTRAMUSCULAR | Status: DC | PRN
  Administered 2016-10-05: 2 mL via SUBCUTANEOUS

## 2016-10-05 MED ORDER — SODIUM CHLORIDE 0.9 % IV SOLN
INTRAVENOUS | Status: DC | PRN
  Administered 2016-10-05: 1.75 mg/kg/h via INTRAVENOUS

## 2016-10-05 MED ORDER — IOPAMIDOL (ISOVUE-370) INJECTION 76%
INTRAVENOUS | Status: AC
  Filled 2016-10-05: qty 50

## 2016-10-05 MED ORDER — MIDAZOLAM HCL 2 MG/2ML IJ SOLN
INTRAMUSCULAR | Status: AC
  Filled 2016-10-05: qty 2

## 2016-10-05 MED ORDER — LOSARTAN POTASSIUM 25 MG PO TABS
25.0000 mg | ORAL_TABLET | Freq: Every day | ORAL | Status: DC
  Administered 2016-10-05: 25 mg via ORAL
  Filled 2016-10-05: qty 1

## 2016-10-05 MED ORDER — SODIUM CHLORIDE 0.9 % WEIGHT BASED INFUSION: 1.0000 mL/kg/h | INTRAVENOUS | Status: DC

## 2016-10-05 MED ORDER — DIPHENHYDRAMINE HCL 50 MG/ML IJ SOLN
INTRAMUSCULAR | Status: DC | PRN
  Administered 2016-10-05: 25 mg via INTRAVENOUS

## 2016-10-05 MED ORDER — SODIUM CHLORIDE 0.9 % IV SOLN: INTRAVENOUS | Status: AC

## 2016-10-05 MED ORDER — ISOSORBIDE MONONITRATE 15 MG HALF TABLET
15.0000 mg | ORAL_TABLET | Freq: Every day | ORAL | Status: DC
  Administered 2016-10-05: 17:00:00 15 mg via ORAL
  Filled 2016-10-05 (×2): qty 1

## 2016-10-05 MED ORDER — ASPIRIN EC 81 MG PO TBEC
81.0000 mg | DELAYED_RELEASE_TABLET | Freq: Every day | ORAL | Status: DC
  Administered 2016-10-06: 81 mg via ORAL
  Filled 2016-10-05 (×2): qty 1

## 2016-10-05 MED ORDER — BIVALIRUDIN 250 MG IV SOLR
INTRAVENOUS | Status: AC
  Filled 2016-10-05: qty 250

## 2016-10-05 MED ORDER — SODIUM CHLORIDE 0.9 % WEIGHT BASED INFUSION
3.0000 mL/kg/h | INTRAVENOUS | Status: DC
  Administered 2016-10-05: 3 mL/kg/h via INTRAVENOUS

## 2016-10-05 MED ORDER — BIVALIRUDIN BOLUS VIA INFUSION - CUPID
INTRAVENOUS | Status: DC | PRN
  Administered 2016-10-05: 15.875 mg via INTRAVENOUS
  Administered 2016-10-05: 47.625 mg via INTRAVENOUS

## 2016-10-05 MED ORDER — SODIUM CHLORIDE 0.9 % IV SOLN
0.2500 mg/kg/h | INTRAVENOUS | Status: AC
  Filled 2016-10-05: qty 250

## 2016-10-05 MED ORDER — HEPARIN SODIUM (PORCINE) 1000 UNIT/ML IJ SOLN
INTRAMUSCULAR | Status: DC | PRN
  Administered 2016-10-05: 3500 [IU] via INTRAVENOUS

## 2016-10-05 MED ORDER — ASPIRIN 81 MG PO CHEW
CHEWABLE_TABLET | ORAL | Status: AC
  Administered 2016-10-05: 81 mg via ORAL
  Filled 2016-10-05: qty 1

## 2016-10-05 MED ORDER — SODIUM CHLORIDE 0.9 % IV SOLN: 250.0000 mL | INTRAVENOUS | Status: DC | PRN

## 2016-10-05 SURGICAL SUPPLY — 20 items
BALLN MOZEC 2.0X12 (BALLOONS) ×3
BALLN ~~LOC~~ MOZEC 2.75X10 (BALLOONS) ×6
BALLOON MOZEC 2.0X12 (BALLOONS) ×1 IMPLANT
BALLOON ~~LOC~~ MOZEC 2.75X10 (BALLOONS) ×2 IMPLANT
CATH HEARTRAIL 6F IL3.5 (CATHETERS) ×3 IMPLANT
CATH INFINITI 5 FR JL3.5 (CATHETERS) ×6 IMPLANT
CATH OPTITORQUE JACKY 4.0 5F (CATHETERS) ×3 IMPLANT
CATH OPTITORQUE TIG 4.0 5F (CATHETERS) ×3 IMPLANT
DEVICE RAD COMP TR BAND LRG (VASCULAR PRODUCTS) ×3 IMPLANT
GLIDESHEATH SLEND SS 6F .021 (SHEATH) ×3 IMPLANT
GUIDEWIRE INQWIRE 1.5J.035X260 (WIRE) ×1 IMPLANT
INQWIRE 1.5J .035X260CM (WIRE) ×3
KIT ENCORE 26 ADVANTAGE (KITS) ×3 IMPLANT
KIT HEART LEFT (KITS) ×3 IMPLANT
PACK CARDIAC CATHETERIZATION (CUSTOM PROCEDURE TRAY) ×3 IMPLANT
STENT XIENCE ALPINE RX 2.5X15 (Permanent Stent) ×3 IMPLANT
SYR MEDRAD MARK V 150ML (SYRINGE) ×3 IMPLANT
TRANSDUCER W/STOPCOCK (MISCELLANEOUS) ×3 IMPLANT
TUBING CIL FLEX 10 FLL-RA (TUBING) ×3 IMPLANT
WIRE RUNTHROUGH .014X180CM (WIRE) ×3 IMPLANT

## 2016-10-05 NOTE — Research (Signed)
[facility] Study Informed Consent   Subject Name: Kim Villarreal  Subject met inclusion and exclusion criteria.  The informed consent form, study requirements and expectations were reviewed with the subject and questions and concerns were addressed prior to the signing of the consent form.  The subject verbalized understanding of the trial requirements.  The subject agreed to participate in the trial and signed the informed consent.  The informed consent was obtained prior to performance of any protocol-specific procedures for the subject.  A copy of the signed informed consent was given to the subject and a copy was placed in the subject's medical record.  Blossom Hoops 10/05/2016, 1:09 PM

## 2016-10-05 NOTE — Interval H&P Note (Signed)
History and Physical Interval Note:  10/05/2016 1:43 PM  Kim Villarreal  has presented today for surgery, with the diagnosis of unstable angina  The various methods of treatment have been discussed with the patient and family. After consideration of risks, benefits and other options for treatment, the patient has consented to  Procedure(s): Left Heart Cath and Coronary Angiography (N/A) as a surgical intervention .  The patient's history has been reviewed, patient examined, no change in status, stable for surgery.  I have reviewed the patient's chart and labs.  Questions were answered to the patient's satisfaction.     [name]

## 2016-10-05 NOTE — Progress Notes (Signed)
TR BAND REMOVAL  LOCATION:    right radial  DEFLATED PER PROTOCOL:    Yes.    TIME BAND OFF / DRESSING APPLIED:    22:45   SITE UPON ARRIVAL:    Level 0  SITE AFTER BAND REMOVAL:    Level 0  CIRCULATION SENSATION AND MOVEMENT:    Within Normal Limits   Yes.    COMMENTS:   Post TR band instructions given. Pt tolerated well. 

## 2016-10-05 NOTE — H&P (View-Only) (Signed)
Cardiology Office Note Date:  10/03/2016  Patient ID:  Kim Villarreal, DOB 03/06/1971, MRN [id number] PCP:  [place]  Cardiologist:  Dr. Fletcher Anon, MD    Chief Complaint: Follow up chest pain  History of Present Illness: Kim Villarreal is a 45 y.o. female with history of palpitations with possible PVCs, prior tobacco abuse beginning at age 30 quitting approximately 10 years ago, anxiety, and family history of CAD who was recently seen on 08/05/2016 for evaulation of chest pain with exercise and SOB. Pain that time was substernal and radiated down her bilateral arms to her elbows. BP had been somewhat elevated in the Q000111Q systolic range. She also noted increased anxiety at the same time. She underwent echo on 08/24/16 that showed a vigorous LV systolic function with an EF of 65-70%, normal wall motion, normal LV diastolic function, and mild mitral regurgitation. She also underwent a treadmill Myoview on 08/11/2016 that showed no significant ischemia, normal wall motion, no EKG changes concerning for ischemia, small region of fixed perfusion defect in the mid to apical anteroseptal region c/w breast attenuation artifact, peak BP of 219/72, overall low-risk study. She was started on losartan given elevated BP. She followed up with PCP on 10/25 noting continued chest pain that involved her entire chest and radiated down her arms. There was associated decreased appetite. Pain was worse with movement and had tried Prilosec OTC without relief. Thought was possible MSK etiology and she was prescribed prednisone, though the patient did not take this. CXR through PCP was reportedly negative (unable to see overread in Care Everywhere). She has continued to have chest pain with associated diaphoresis. BP has been well controlled since starting losartan. She has an appointment with GI in early December.  She has noted progressive worsening of chest pain that is now much worse with exertion and as well as  occurring as rest and when laying supine. She notes dull chest pain along her upper chest that radiates along both sides of her chest and down her bilateral arms. Previously with some associated diaphoresis, though none recently. When exercising pain will develop within one minute of exercise and last until she rests. Pain will improve with approximately 1 minute of rest. When she has pain at rest now it will last approximately 5 minutes. PPI, H2 blockers, or TUMs have not helped. Currently without chest pain.    Past Medical History:  Diagnosis Date  . Anxiety   . Bursitis   . History of nuclear stress test    a. 07/2016: no sig ischemia, nl wall motion, EF 72%, small defect along mid to apical anteroseptal wall c/w breast attenuation artifact, low risk study  . Mitral regurgitation    a. echo 08/2016: vigorous EF 65-70%, nl wall motion, nl LV diastolic fxn, mild MR  . Tendonitis     Past Surgical History:  Procedure Laterality Date  . CESAREAN SECTION      Current Outpatient Prescriptions  Medication Sig Dispense Refill  . Cholecalciferol (VITAMIN D-1000 MAX ST) 1000 units tablet Take 2,000 Units by mouth daily.     Marland Kitchen losartan (COZAAR) 25 MG tablet Take 1 tablet (25 mg total) by mouth daily. 30 tablet 3  . magnesium gluconate (MAGONATE) 1000 (54 Mg) MG/5ML syrup Take 400 mg by mouth daily before breakfast.    . nitrofurantoin (MACRODANTIN) 25 MG capsule Take 25 mg by mouth as needed.      No current facility-administered medications for this visit.  Allergies:   Codeine   Social History:  The patient  reports that she has quit smoking. She quit after 15.00 years of use. She has never used smokeless tobacco. She reports that she drinks alcohol. She reports that she does not use drugs.   Family History:  The patient's family history includes Heart disease in her mother.  ROS:   Review of Systems  Constitutional: Positive for diaphoresis and malaise/fatigue. Negative for chills,  fever and weight loss.  HENT: Negative for congestion.   Eyes: Negative for discharge and redness.  Respiratory: Negative for cough, hemoptysis, sputum production, shortness of breath and wheezing.   Cardiovascular: Positive for chest pain and palpitations. Negative for orthopnea, claudication, leg swelling and PND.  Gastrointestinal: Negative for abdominal pain, blood in stool, heartburn, melena, nausea and vomiting.  Genitourinary: Negative for hematuria.  Musculoskeletal: Negative for falls and myalgias.  Skin: Negative for rash.  Neurological: Positive for weakness. Negative for dizziness, tingling, tremors, sensory change, speech change, focal weakness and loss of consciousness.  Endo/Heme/Allergies: Does not bruise/bleed easily.  Psychiatric/Behavioral: Negative for substance abuse. The patient is not nervous/anxious.   All other systems reviewed and are negative.    PHYSICAL EXAM:  VS:  BP 124/60 (BP Location: Left Arm, Patient Position: Sitting, Cuff Size: Normal)   Pulse 78   Ht 5' 6.5" (1.689 m)   Wt 141 lb 8 oz (64.2 kg)   BMI 22.50 kg/m  BMI: Body mass index is 22.5 kg/m.  Physical Exam  Constitutional: She is oriented to person, place, and time. She appears well-developed and well-nourished.  HENT:  Head: Normocephalic and atraumatic.  Eyes: Right eye exhibits no discharge. Left eye exhibits no discharge.  Neck: Normal range of motion. No JVD present.  Cardiovascular: Normal rate, regular rhythm, S1 normal, S2 normal and normal heart sounds.  Exam reveals no distant heart sounds, no friction rub, no midsystolic click and no opening snap.   No murmur heard. Pulmonary/Chest: Effort normal and breath sounds normal. No respiratory distress. She has no decreased breath sounds. She has no wheezes. She has no rales. She exhibits no tenderness.  Abdominal: Soft. She exhibits no distension. There is no tenderness.  Musculoskeletal: She exhibits no edema.  Neurological: She is  alert and oriented to person, place, and time.  Skin: Skin is warm and dry. No cyanosis. Nails show no clubbing.  Psychiatric: She has a normal mood and affect. Her speech is normal and behavior is normal. Judgment and thought content normal.    EKG:  Was ordered and interpreted by me today. Shows NSR, 76 bpm, nonspecific lateral st/t changes   Recent Labs: 09/26/2016: BUN 9; Creatinine, Ser 0.86; Potassium 4.9; Sodium 141  No results found for requested labs within last 8760 hours.   Estimated Creatinine Clearance: 79.7 mL/min (by C-G formula based on SCr of 0.86 mg/dL).   Wt Readings from Last 3 Encounters:  10/03/16 141 lb 8 oz (64.2 kg)  08/05/16 142 lb 4 oz (64.5 kg)     Other studies reviewed: Additional studies/records reviewed today include: summarized above  ASSESSMENT AND PLAN:  1. Unstable angina: Symptoms progressive to now occurring at rest and possibly with angina decubitus. Symptoms continue to worsen and occur daily at this time with some episodes of 10/10 pain that radiate down the bilateral arms. Given persistent to worsening symptoms since her treadmill Myoview we discussed further evaluation options including cardiac cath and coronary CT. It was decided upon for her to undergo LHC  at [facility] with Dr. Fletcher Anon, MD. Start ASA 81 mg daily and Imdur 15 mg daily. Should she develop headache with Imdur will change to Ranexa. Discussed with Dr. Fletcher Anon, MD. Risks and benefits of cardiac catheterization have been discussed with the patient including risks of bleeding, bruising, infection, kidney damage, stroke, heart attack, and death. The patient understands these risks and is willing to proceed with the procedure. All questions have been answered and concerns listened to.   2. HTN: Well controlled. Continue losartan 25 mg daily. Add Imdur 15 mg daily as above.   Disposition: F/u with Dr. Fletcher Anon, MD or myself s/p LHC.    Current medicines are reviewed at length with the patient today.   The patient did not have any concerns regarding medicines.  Melvern Banker PA-C 10/03/2016 2:52 PM     [place], [place] [address] [phone]

## 2016-10-05 NOTE — Progress Notes (Signed)
1830 called to room, patient has bleeding at site and proximal/distal to band. Area cleaned, continued to bleed. 1 ml of air added to band. Band area cleaned, gauze put under band proximal and distal edges for comfort, extremity taped to board. Patient stated site was much more comfortable. 1 ml of air removed from band and area assessed. No bleeding, bruising or hematoma note. Rechecked at 1900 with no change.

## 2016-10-06 ENCOUNTER — Encounter (HOSPITAL_COMMUNITY): Payer: Self-pay | Admitting: Cardiovascular Disease

## 2016-10-06 DIAGNOSIS — I340 Nonrheumatic mitral (valve) insufficiency: Secondary | ICD-10-CM | POA: Diagnosis not present

## 2016-10-06 DIAGNOSIS — I2510 Atherosclerotic heart disease of native coronary artery without angina pectoris: Secondary | ICD-10-CM

## 2016-10-06 DIAGNOSIS — I10 Essential (primary) hypertension: Secondary | ICD-10-CM | POA: Diagnosis not present

## 2016-10-06 DIAGNOSIS — I25110 Atherosclerotic heart disease of native coronary artery with unstable angina pectoris: Secondary | ICD-10-CM | POA: Diagnosis not present

## 2016-10-06 DIAGNOSIS — Z955 Presence of coronary angioplasty implant and graft: Secondary | ICD-10-CM

## 2016-10-06 DIAGNOSIS — Z79899 Other long term (current) drug therapy: Secondary | ICD-10-CM | POA: Diagnosis not present

## 2016-10-06 DIAGNOSIS — I200 Unstable angina: Secondary | ICD-10-CM | POA: Diagnosis not present

## 2016-10-06 LAB — CBC
HCT: 39.4 % (ref 36.0–46.0)
Hemoglobin: 12.9 g/dL (ref 12.0–15.0)
MCH: 27.4 pg (ref 26.0–34.0)
MCHC: 32.7 g/dL (ref 30.0–36.0)
MCV: 83.7 fL (ref 78.0–100.0)
Platelets: 168 10*3/uL (ref 150–400)
RBC: 4.71 MIL/uL (ref 3.87–5.11)
RDW: 13.9 % (ref 11.5–15.5)
WBC: 8 10*3/uL (ref 4.0–10.5)

## 2016-10-06 LAB — BASIC METABOLIC PANEL
Anion gap: 7 (ref 5–15)
BUN: 13 mg/dL (ref 6–20)
CHLORIDE: 110 mmol/L (ref 101–111)
CO2: 22 mmol/L (ref 22–32)
CREATININE: 0.91 mg/dL (ref 0.44–1.00)
Calcium: 9.3 mg/dL (ref 8.9–10.3)
GFR calc Af Amer: >60 mL/min (ref >60)
GFR calc non Af Amer: >60 mL/min (ref >60)
GLUCOSE: 172 mg/dL — AB (ref 65–99)
POTASSIUM: 3.7 mmol/L (ref 3.5–5.1)
Sodium: 139 mmol/L (ref 135–145)

## 2016-10-06 MED ORDER — NITROGLYCERIN 0.4 MG SL SUBL: 0.4000 mg | SUBLINGUAL_TABLET | SUBLINGUAL | Status: DC | PRN

## 2016-10-06 MED ORDER — ATORVASTATIN CALCIUM 40 MG PO TABS: 40.0000 mg | ORAL_TABLET | Freq: Every day | ORAL | 11 refills | Status: DC

## 2016-10-06 MED ORDER — CLOPIDOGREL BISULFATE 75 MG PO TABS: 75.0000 mg | ORAL_TABLET | Freq: Every day | ORAL | 11 refills | Status: DC

## 2016-10-06 MED ORDER — NITROGLYCERIN 0.4 MG SL SUBL
0.4000 mg | SUBLINGUAL_TABLET | SUBLINGUAL | 2 refills | Status: DC | PRN
Start: 2016-10-06 — End: 2018-05-22

## 2016-10-06 MED FILL — Verapamil HCl IV Soln 2.5 MG/ML: INTRAVENOUS | Qty: 2 | Status: AC

## 2016-10-06 NOTE — Progress Notes (Signed)
CARDIAC REHAB PHASE I   PRE:  Rate/Rhythm: 87 SR    BP: sitting 149/54    SaO2:   MODE:  Ambulation: 1000 ft   POST:  Rate/Rhythm: 100 ST upon standing, 90 SR after walk, x1 PVC    BP: sitting 172/50     SaO2:   Pt c/o slight lightheadedness/fatigue with walking, prob due to inactivity recently. Pt also admits to reflux sx after walking, which she would typically have PTA after her angina would resolve with rest. Reflux relieved with a few minutes rest. BP elevated. Ed completed with good reception. She eats fairly healthy and exercises 6 days a week at a gym. Gave walking gl and instructed no vigorous ex until she sees cardiologist. She is interested in [place] and I will refer to [place]. It would be beneficial for her to start quickly.  Understands the importance of Plavix/ASA.  [place], ACSM 10/06/2016 8:53 AM

## 2016-10-06 NOTE — Care Management Note (Signed)
Case Management Note  Patient Details  Name: Kim Villarreal MRN: [id number] Date of Birth: Dec 02, 1970  Subjective/Objective:   S/p coronary stent, will be on plavix , patient for dc today, no needs.                 Action/Plan:   Expected Discharge Date:                  Expected Discharge Plan:  Home/Self Care  In-House Referral:  NA  Discharge planning Services  CM Consult  Post Acute Care Choice:  NA Choice offered to:  NA  DME Arranged:  N/A DME Agency:  NA  HH Arranged:  NA HH Agency:  NA  Status of Service:  Completed, signed off  If discussed at [place] of Stay Meetings, dates discussed:    Additional Comments:  Zenon Mayo, RN 10/06/2016, 9:27 AM

## 2016-10-06 NOTE — Progress Notes (Signed)
Patient Name: Kim Villarreal Date of Encounter: 10/06/2016  Primary Cardiologist: Dr. Antionette Char Problem List     Principal Problem:   Unstable angina [facility]) Active Problems:   Essential hypertension   CAD (coronary artery disease), native coronary artery    Subjective   Denies any chest discomfort or palpitations. Breathing at baseline.   Inpatient Medications    Scheduled Meds: . aspirin EC  81 mg Oral Daily  . atorvastatin  40 mg Oral q1800  . cholecalciferol  2,000 Units Oral Daily  . clopidogrel  75 mg Oral Q breakfast  . isosorbide mononitrate  15 mg Oral Daily  . losartan  25 mg Oral Daily  . sodium chloride flush  3 mL Intravenous Q12H   Continuous Infusions:  PRN Meds: sodium chloride, acetaminophen, ondansetron (ZOFRAN) IV, sodium chloride flush, zolpidem   Vital Signs    Vitals:   10/05/16 1900 10/05/16 1931 10/05/16 2000 10/06/16 0645  BP: (!) 142/55 (!) 142/55  (!) 138/56  Pulse:  81  88  Resp: (!) 22 (!) 21 (!) 22 17  Temp:    97.8 F (36.6 C)  TempSrc:  Oral  Oral  SpO2:  97%  95%  Weight:    141 lb 1.5 oz (64 kg)  Height:        Intake/Output Summary (Last 24 hours) at 10/06/16 0755 Last data filed at 10/06/16 0000  Gross per 24 hour  Intake           899.69 ml  Output              900 ml  Net            -0.31 ml   Filed Weights   10/05/16 1107 10/06/16 0645  Weight: 140 lb (63.5 kg) 141 lb 1.5 oz (64 kg)    Physical Exam   GEN: Well nourished, well developed, Caucasian female appearing in no acute distress.  HEENT: Grossly normal.  Neck: Supple, no JVD, carotid bruits, or masses. Cardiac: RRR, no murmurs, rubs, or gallops. No clubbing, cyanosis, edema.  Radials/DP/PT 2+ and equal bilaterally.  Respiratory:  Respirations regular and unlabored, clear to auscultation bilaterally. GI: Soft, nontender, nondistended, BS + x 4. MS: no deformity or atrophy. Skin: warm and dry, no rash. Right radial cath site without ecchymosis  or evidence of a hematoma.  Neuro:  Strength and sensation are intact. Psych: AAOx3.  Normal affect.  Labs    CBC  Recent Labs  10/03/16 1532 10/06/16 0203  WBC 8.9 8.0  NEUTROABS 6.5  --   HGB 14.7 12.9  HCT 44.1 39.4  MCV 82.4 83.7  PLT 177 [id number]   Basic Metabolic Panel  Recent Labs  10/03/16 1532 10/06/16 0203  NA 139 139  K 3.7 3.7  CL 101 110  CO2 29 22  GLUCOSE 106* 172*  BUN 14 13  CREATININE 0.86 0.91  CALCIUM 10.1 9.3    Telemetry    NSR, HR in 60's - 70's. No atopic events.  - Personally Reviewed  ECG    NSR, HR 74 with sinus arrhythmia. - Personally Reviewed  Radiology    No results found.  Cardiac Studies   Cardiac Catheterization: 10/05/2016  The left ventricular systolic function is normal.  LV end diastolic pressure is mildly elevated.  The left ventricular ejection fraction is 55-65% by visual estimate.  A STENT XIENCE ALPINE RX 2.5X15 drug eluting stent was successfully placed.  Mid LAD lesion, 95 %  stenosed.  Post intervention, there is a 0% residual stenosis.   1. Severe one-vessel coronary artery disease with 95% stenosis in the mid LAD. 2. Normal LV systolic function and mildly elevated left ventricular end-diastolic pressure. 3. Successful angioplasty and drug-eluting stent placement to the mid LAD.  Recommendations: Dual antiplatelet therapy for at least 6 months. Aggressive treatment of risk factors. The patient had mild lip swelling at the end of the case without rash or respiratory distress. This might be due to dye reaction. She was treated with IV Solu-Medrol and Benadryl.  Patient Profile     45 yo female w/ PMH of palpitations, prior tobacco use, and family history of CAD who was recently seen in the office for evaluation of chest pain. Presented to [facility] on 10/05/2016 for cardiac catheterization.   Assessment & Plan    1. Unstable Angina - recently seen in the office for evaluation of chest pain concerning for  unstable angina. Cardiac cath recommended.  - cath on 11/16 showed severe one-vessel CAD with 95% stenosis of the mid-LAD. A Xience 2.5x51mm DES was placed. She has been started on DAPT with ASA and Plavix. Continue statin. Patient was on Imdur prior to admission but only took this one day and had a headache, therefore she would like to stop this medication. Consider the addition of Lopressor 12.5mg  BID prior to discharge or as an outpatient.   2. HTN - BP well-controlled. - continue current medication regimen.  Signed, Erma Heritage, PA  10/06/2016, 7:55 AM

## 2016-10-06 NOTE — Discharge Summary (Signed)
Discharge Summary    Patient ID: Kim Villarreal,  MRN: [id number], DOB/AGE: 1970/12/29 45 y.o.  Admit date: 10/05/2016 Discharge date: 10/06/2016  Primary Care Provider: Minette Headland Primary Cardiologist: Dr. Fletcher Anon  Discharge Diagnoses    Principal Problem:   Unstable angina [facility]) Active Problems:   Essential hypertension   CAD (coronary artery disease), native coronary artery   Status post coronary artery stent placement   History of Present Illness     Kim Villarreal is a 45 y.o. female with past medical history of palpitations, prior tobacco use, and family history of CAD who was recently seen in the office for evaluation of chest pain.   A NST was performed which showed a fixed perfusion defect, thought to be most consistent with breast attenuation and overall the test was low-risk. However, she continued to have chest pain with exertion and a cardiac catheterization was recommended for definitive evaluation.   She presented to [facility] on 10/05/2016 for the procedure.   Hospital Course     Consultants: None   Her catheterization showed severe one-vessel CAD with 95% stenosis of the mid-LAD. A Xience 2.5x17mm DES was placed. She has been started on DAPT with ASA and Plavix. She was started on statin therapy and the addition of Lopressor as an outpatient should be considered pending BP at that time (BP mildly elevated during admission but patient reported having well-controlled readings in the outpatient setting).   The following morning, she denied any repeat episodes of chest discomfort or dyspnea with exertion. Right radial cath site was stable without ecchymosis or evidence of a hematoma. Creatinine and Hgb were within normal limits. She ambulated over 1000 ft with cardiac rehab.   She was last examined by Dr. Debara Pickett and deemed stable for discharge. She will follow-up at [facility] within the next 2 weeks. She was discharged home in good  condition.  _____________  Discharge Vitals Blood pressure (!) 149/51, pulse 74, temperature 97.9 F (36.6 C), temperature source Oral, resp. rate 19, height 5' 6.5" (1.689 m), weight 141 lb 1.5 oz (64 kg), last menstrual period 09/24/2016, SpO2 95 %.  Filed Weights   10/05/16 1107 10/06/16 0645  Weight: 140 lb (63.5 kg) 141 lb 1.5 oz (64 kg)    Labs & Radiologic Studies     CBC  Recent Labs  10/03/16 1532 10/06/16 0203  WBC 8.9 8.0  NEUTROABS 6.5  --   HGB 14.7 12.9  HCT 44.1 39.4  MCV 82.4 83.7  PLT 177 [id number]   Basic Metabolic Panel  Recent Labs  10/03/16 1532 10/06/16 0203  NA 139 139  K 3.7 3.7  CL 101 110  CO2 29 22  GLUCOSE 106* 172*  BUN 14 13  CREATININE 0.86 0.91  CALCIUM 10.1 9.3   Liver Function Tests No results for input(s): AST, ALT, ALKPHOS, BILITOT, PROT, ALBUMIN in the last 72 hours. No results for input(s): LIPASE, AMYLASE in the last 72 hours. Cardiac Enzymes No results for input(s): CKTOTAL, CKMB, CKMBINDEX, TROPONINI in the last 72 hours. BNP Invalid input(s): POCBNP D-Dimer No results for input(s): DDIMER in the last 72 hours. Hemoglobin A1C No results for input(s): HGBA1C in the last 72 hours. Fasting Lipid Panel No results for input(s): CHOL, HDL, LDLCALC, TRIG, CHOLHDL, LDLDIRECT in the last 72 hours. Thyroid Function Tests No results for input(s): TSH, T4TOTAL, T3FREE, THYROIDAB in the last 72 hours.  Invalid input(s): FREET3  No results found.   Diagnostic  Studies/Procedures    Cardiac Catheterization: 10/05/2016  The left ventricular systolic function is normal.  LV end diastolic pressure is mildly elevated.  The left ventricular ejection fraction is 55-65% by visual estimate.  A STENT XIENCE ALPINE RX 2.5X15 drug eluting stent was successfully placed.  Mid LAD lesion, 95 %stenosed.  Post intervention, there is a 0% residual stenosis.  1. Severe one-vessel coronary artery disease with 95% stenosis in the mid  LAD. 2. Normal LV systolic function and mildly elevated left ventricular end-diastolic pressure. 3. Successful angioplasty and drug-eluting stent placement to the mid LAD.  Recommendations: Dual antiplatelet therapy for at least 6 months. Aggressive treatment of risk factors. The patient had mild lip swelling at the end of the case without rash or respiratory distress. This might be due to dye reaction. She was treated with IV Solu-Medrol and Benadryl.  Disposition   Pt is being discharged home today in good condition.  Follow-up Plans & Appointments    Follow-up Information    Christell Faith, PA-C Follow up on 10/20/2016.   Specialties:  Physician Assistant, Cardiology, Radiology Why:  Minor on 10/20/2016 at 2:00PM. Contact information: [place] [address] [phone]          Discharge Instructions    AMB Referral to Cardiac Rehabilitation - Phase II    Complete by:  As directed    Diagnosis:   PTCA Stable Angina     Amb Referral to Cardiac Rehabilitation    Complete by:  As directed    Please help her get started quickly, avid exerciser.   Diagnosis:   Coronary Stents PTCA     Discharge instructions    Complete by:  As directed    PLEASE REMEMBER TO BRING ALL OF YOUR MEDICATIONS TO EACH OF YOUR FOLLOW-UP OFFICE VISITS.  PLEASE ATTEND ALL SCHEDULED FOLLOW-UP APPOINTMENTS.   Activity: Increase activity slowly as tolerated. You may shower, but no soaking baths (or swimming) for 1 week. No driving for 24 hours. No lifting over 5 lbs for 1 week. No sexual activity for 1 week.   You May Return to Work: in 1 week (if applicable)  Wound Care: You may wash cath site gently with soap and water. Keep cath site clean and dry. If you notice pain, swelling, bleeding or pus at your cath site, please call [phone].   Increase activity slowly    Complete by:  As directed       Discharge Medications     Medication  List    STOP taking these medications   isosorbide mononitrate 30 MG 24 hr tablet Commonly known as:  IMDUR     TAKE these medications   aspirin EC 81 MG tablet Take 1 tablet (81 mg total) by mouth daily. Notes to patient:  Prevents clotting in stent and heart attack   atorvastatin 40 MG tablet Commonly known as:  LIPITOR Take 1 tablet (40 mg total) by mouth daily at 6 PM. Notes to patient:  Cholesterol    clopidogrel 75 MG tablet Commonly known as:  PLAVIX Take 1 tablet (75 mg total) by mouth daily with breakfast. Start taking on:  10/07/2016 Notes to patient:  Prevents clotting in stent and heart attack   losartan 25 MG tablet Commonly known as:  COZAAR Take 1 tablet (25 mg total) by mouth daily.   Magnesium Gluconate Powd Apply 1 application topically daily.   nitrofurantoin 25 MG capsule Commonly known as:  MACRODANTIN Take  25 mg by mouth as needed.   nitroGLYCERIN 0.4 MG SL tablet Commonly known as:  NITROSTAT Place 1 tablet (0.4 mg total) under the tongue every 5 (five) minutes as needed for chest pain.   VITAMIN D-1000 MAX ST 1000 units tablet Generic drug:  Cholecalciferol Take 2,000 Units by mouth daily.       Aspirin prescribed at discharge?  Yes High Intensity Statin Prescribed? (Lipitor 40-80mg  or Crestor 20-40mg ): Yes Beta Blocker Prescribed? No: Consider at Outpatient Follow-Up For EF 40% or less, Was ACEI/ARB Prescribed? No: N/A ADP Receptor Inhibitor Prescribed? (i.e. Plavix etc.-Includes Medically Managed Patients): Yes For EF <40%, Aldosterone Inhibitor Prescribed? No: N/A Was EF assessed during THIS hospitalization? Yes - Cath Was Cardiac Rehab II ordered? (Included Medically managed Patients): Yes   Allergies Allergies  Allergen Reactions  . Codeine Nausea And Vomiting    Outstanding Labs/Studies   Lipid Panel and LFT's in 3 months.  Duration of Discharge Encounter   Greater than 30 minutes including physician  time.  Signed, Erma Heritage, PA-C 10/06/2016, 12:38 PM

## 2016-10-17 ENCOUNTER — Encounter: Attending: Cardiovascular Disease | Admitting: *Deleted

## 2016-10-17 ENCOUNTER — Ambulatory Visit: Admitting: Cardiovascular Disease

## 2016-10-17 VITALS — Ht 66.8 in | Wt 139.6 lb

## 2016-10-17 DIAGNOSIS — Z79899 Other long term (current) drug therapy: Secondary | ICD-10-CM | POA: Diagnosis not present

## 2016-10-17 DIAGNOSIS — I2510 Atherosclerotic heart disease of native coronary artery without angina pectoris: Secondary | ICD-10-CM | POA: Diagnosis not present

## 2016-10-17 DIAGNOSIS — Z87891 Personal history of nicotine dependence: Secondary | ICD-10-CM | POA: Diagnosis not present

## 2016-10-17 DIAGNOSIS — Z955 Presence of coronary angioplasty implant and graft: Secondary | ICD-10-CM | POA: Diagnosis present

## 2016-10-17 DIAGNOSIS — Z7982 Long term (current) use of aspirin: Secondary | ICD-10-CM | POA: Insufficient documentation

## 2016-10-17 NOTE — Patient Instructions (Signed)
Patient Instructions  Patient Details  Name: Kim Villarreal MRN: [id number] Date of Birth: Oct 29, 1971 Referring Provider:  Wellington Hampshire, MD  Below are the personal goals you chose as well as exercise and nutrition goals. Our goal is to help you keep on track towards obtaining and maintaining your goals. We will be discussing your progress on these goals with you throughout the program.  Initial Exercise Prescription:     Initial Exercise Prescription - 10/17/16 1300      Date of Initial Exercise RX and Referring Provider   Date 10/17/16   Referring Provider [name] MD     Elliptical   Level 3   Speed 4   Minutes 30     REL-XR   Level 3   Watts --  speed 50 rpm   Minutes 15   METs 3     Prescription Details   Frequency (times per week) 3   Duration Progress to 45 minutes of aerobic exercise without signs/symptoms of physical distress     Intensity   THRR 40-80% of Max Heartrate 107-153   Ratings of Perceived Exertion 11-15   Perceived Dyspnea 0-4     Progression   Progression Continue to progress workloads to maintain intensity without signs/symptoms of physical distress.     Resistance Training   Training Prescription Yes   Weight 3 lbs   Reps 10-12      Exercise Goals: Frequency: Be able to perform aerobic exercise three times per week working toward 3-5 days per week.  Intensity: Work with a perceived exertion of 11 (fairly light) - 15 (hard) as tolerated. Follow your new exercise prescription and watch for changes in prescription as you progress with the program. Changes will be reviewed with you when they are made.  Duration: You should be able to do 30 minutes of continuous aerobic exercise in addition to a 5 minute warm-up and a 5 minute cool-down routine.  Nutrition Goals: Your personal nutrition goals will be established when you do your nutrition analysis with the dietician.  The following are nutrition guidelines to  follow: Cholesterol < 200mg /day Sodium < 1500mg /day Fiber: Women under 50 yrs - 25 grams per day  Personal Goals:     Personal Goals and Risk Factors at Admission - 10/17/16 1252      Core Components/Risk Factors/Patient Goals on Admission    Weight Management Weight Loss;Yes   Intervention Weight Management: Develop a combined nutrition and exercise program designed to reach desired caloric intake, while maintaining appropriate intake of nutrient and fiber, sodium and fats, and appropriate energy expenditure required for the weight goal.;Weight Management: Provide education and appropriate resources to help participant work on and attain dietary goals.   Admit Weight 139 lb 9.6 oz (63.3 kg)   Goal Weight: Short Term 135 lb (61.2 kg)   Goal Weight: Long Term 135 lb (61.2 kg)   Expected Outcomes Short Term: Continue to assess and modify interventions until short term weight is achieved;Long Term: Adherence to nutrition and physical activity/exercise program aimed toward attainment of established weight goal;Weight Loss: Understanding of general recommendations for a balanced deficit meal plan, which promotes 1-2 lb weight loss per week and includes a negative energy balance of [phone] kcal/d;Understanding recommendations for meals to include 15-35% energy as protein, 25-35% energy from fat, 35-60% energy from carbohydrates, less than 200mg  of dietary cholesterol, 20-35 gm of total fiber daily;Understanding of distribution of calorie intake throughout the day with the consumption of 4-5 meals/snacks  Increase Strength and Stamina Yes  regain muscle mass   Intervention Provide advice, education, support and counseling about physical activity/exercise needs.;Develop an individualized exercise prescription for aerobic and resistive training based on initial evaluation findings, risk stratification, comorbidities and participant's personal goals.   Expected Outcomes Achievement of increased  cardiorespiratory fitness and enhanced flexibility, muscular endurance and strength shown through measurements of functional capacity and personal statement of participant.   Hypertension Yes  Diagnosed during stress test. States that BP has been good prior.  Will watch and counsel as needed.   Intervention Provide education on lifestyle modifcations including regular physical activity/exercise, weight management, moderate sodium restriction and increased consumption of fresh fruit, vegetables, and low fat dairy, alcohol moderation, and smoking cessation.;Monitor prescription use compliance.   Expected Outcomes Short Term: Continued assessment and intervention until BP is < 140/19mm HG in hypertensive participants. < 130/1mm HG in hypertensive participants with diabetes, heart failure or chronic kidney disease.;Long Term: Maintenance of blood pressure at goal levels.   Stress Yes  UNable to get back to full exercise with helps her keep her stress levels low.    Intervention Offer individual and/or small group education and counseling on adjustment to heart disease, stress management and health-related lifestyle change. Teach and support self-help strategies.   Expected Outcomes Short Term: Participant demonstrates changes in health-related behavior, relaxation and other stress management skills, ability to obtain effective social support, and compliance with psychotropic medications if prescribed.;Long Term: Emotional wellbeing is indicated by absence of clinically significant psychosocial distress or social isolation.  Get Jermaine back to her daily exercise routine.      Tobacco Use Initial Evaluation: History  Smoking Status  . Former Smoker  . Packs/day: 0.75  . Years: 15.00  . Types: Cigarettes  . Quit date: 2007  Smokeless Tobacco  . Never Used    Copy of goals given to participant.

## 2016-10-17 NOTE — Progress Notes (Signed)
Cardiac Individual Treatment Plan  Patient Details  Name: Kim Villarreal MRN: [id number] Date of Birth: 04-30-1971 Referring Provider:   Flowsheet Row Cardiac Rehab from 10/17/2016 in [facility] Cardiac and Pulmonary Rehab  Referring Provider  [name] MD      Initial Encounter Date:  Flowsheet Row Cardiac Rehab from 10/17/2016 in [facility] Cardiac and Pulmonary Rehab  Date  10/17/16  Referring Provider  [name] MD      Visit Diagnosis: Status post coronary artery stent placement  Patient's Home Medications on Admission:  Current Outpatient Prescriptions:  .  aspirin EC 81 MG tablet, Take 1 tablet (81 mg total) by mouth daily., Disp: 90 tablet, Rfl: 3 .  atorvastatin (LIPITOR) 40 MG tablet, Take 1 tablet (40 mg total) by mouth daily at 6 PM., Disp: 30 tablet, Rfl: 11 .  Cholecalciferol (VITAMIN D-1000 MAX ST) 1000 units tablet, Take 2,000 Units by mouth daily. , Disp: , Rfl:  .  clopidogrel (PLAVIX) 75 MG tablet, Take 1 tablet (75 mg total) by mouth daily with breakfast., Disp: 30 tablet, Rfl: 11 .  losartan (COZAAR) 25 MG tablet, Take 1 tablet (25 mg total) by mouth daily., Disp: 30 tablet, Rfl: 3 .  Magnesium Gluconate POWD, Apply 1 application topically daily., Disp: , Rfl:  .  nitrofurantoin (MACRODANTIN) 25 MG capsule, Take 25 mg by mouth as needed. , Disp: , Rfl:  .  nitroGLYCERIN (NITROSTAT) 0.4 MG SL tablet, Place 1 tablet (0.4 mg total) under the tongue every 5 (five) minutes as needed for chest pain., Disp: 25 tablet, Rfl: 2  Past Medical History: Past Medical History:  Diagnosis Date  . Anemia    "as a child"  . Anxiety   . Bursitis   . Coronary artery disease    a. 09/2016: cath showing 95% stenosis of the mid-LAD. Xience 2.5x21mm DES placed.  Marland Kitchen History of nuclear stress test    a. 07/2016: no sig ischemia, nl wall motion, EF 72%, small defect along mid to apical anteroseptal wall c/w breast attenuation artifact, low risk study  . Hypertension   . Migraine     "it was a bout; beginning of the year; for ~ 1 month; went away" (10/05/2016)  . Mitral regurgitation    a. echo 08/2016: vigorous EF 65-70%, nl wall motion, nl LV diastolic fxn, mild MR  . Tendonitis     Tobacco Use: History  Smoking Status  . Former Smoker  . Packs/day: 0.75  . Years: 15.00  . Types: Cigarettes  . Quit date: 2007  Smokeless Tobacco  . Never Used    Labs: Recent Review Flowsheet Data    There is no flowsheet data to display.       Exercise Target Goals: Date: 10/17/16  Exercise Program Goal: Individual exercise prescription set with THRR, safety & activity barriers. Participant demonstrates ability to understand and report RPE using BORG scale, to self-measure pulse accurately, and to acknowledge the importance of the exercise prescription.  Exercise Prescription Goal: Starting with aerobic activity 30 plus minutes a day, 3 days per week for initial exercise prescription. Provide home exercise prescription and guidelines that participant acknowledges understanding prior to discharge.  Activity Barriers & Risk Stratification:     Activity Barriers & Cardiac Risk Stratification - 10/17/16 1305      Activity Barriers & Cardiac Risk Stratification   Activity Barriers None   Cardiac Risk Stratification Moderate      6 Minute Walk:     6 Minute Walk  [place] Name 10/17/16 1343         6 Minute Walk   Phase Initial     Distance 1850 feet     Walk Time 6 minutes     # of Rest Breaks 0     MPH 3.5     METS 3.61     RPE 7     VO2 Peak 19.65     Symptoms No     Resting HR 61 bpm     Resting BP 126/64     Max Ex. HR 102 bpm     Max Ex. BP 132/64     2 Minute Post BP 128/70        Initial Exercise Prescription:     Initial Exercise Prescription - 10/17/16 1300      Date of Initial Exercise RX and Referring Provider   Date 10/17/16   Referring Provider [name] MD     Elliptical   Level 3   Speed 4   Minutes 30      REL-XR   Level 3   Watts --  speed 50 rpm   Minutes 15   METs 3     Prescription Details   Frequency (times per week) 3   Duration Progress to 45 minutes of aerobic exercise without signs/symptoms of physical distress     Intensity   THRR 40-80% of Max Heartrate 107-153   Ratings of Perceived Exertion 11-15   Perceived Dyspnea 0-4     Progression   Progression Continue to progress workloads to maintain intensity without signs/symptoms of physical distress.     Resistance Training   Training Prescription Yes   Weight 3 lbs   Reps 10-12      Perform Capillary Blood Glucose checks as needed.  Exercise Prescription Changes:     Exercise Prescription Changes    Row Name 10/17/16 1300             Exercise Review   Progression -  walk test results         Response to Exercise   Blood Pressure (Admit) 126/64       Blood Pressure (Exercise) 132/64       Blood Pressure (Exit) 128/70       Heart Rate (Admit) 61 bpm       Heart Rate (Exercise) 102 bpm       Heart Rate (Exit) 63 bpm       Oxygen Saturation (Admit) 99 %       Oxygen Saturation (Exercise) 99 %       Rating of Perceived Exertion (Exercise) 7       Symptoms none          Exercise Comments:     Exercise Comments    Row Name 10/17/16 1347           Exercise Comments Biggest exercise goal is to be able to workout again without restrictions.          Discharge Exercise Prescription (Final Exercise Prescription Changes):     Exercise Prescription Changes - 10/17/16 1300      Exercise Review   Progression --  walk test results     Response to Exercise   Blood Pressure (Admit) 126/64   Blood Pressure (Exercise) 132/64   Blood Pressure (Exit) 128/70   Heart Rate (Admit) 61 bpm   Heart Rate (Exercise) 102 bpm   Heart Rate (Exit) 63 bpm   Oxygen Saturation (Admit) 99 %  Oxygen Saturation (Exercise) 99 %   Rating of Perceived Exertion (Exercise) 7   Symptoms none      Nutrition:   Target Goals: Understanding of nutrition guidelines, daily intake of sodium 1500mg , cholesterol 200mg , calories 30% from fat and 7% or less from saturated fats, daily to have 5 or more servings of fruits and vegetables.  Biometrics:     Pre Biometrics - 10/17/16 1349      Pre Biometrics   Height 5' 6.8" (1.697 m)   Weight 139 lb 9.6 oz (63.3 kg)   Waist Circumference 30 inches   Hip Circumference 36 inches   Waist to Hip Ratio 0.83 %   BMI (Calculated) 22   Single Leg Stand 30 seconds       Nutrition Therapy Plan and Nutrition Goals:     Nutrition Therapy & Goals - 10/17/16 1250      Intervention Plan   Intervention Prescribe, educate and counsel regarding individualized specific dietary modifications aiming towards targeted core components such as weight, hypertension, lipid management, diabetes, heart failure and other comorbidities.   Expected Outcomes Short Term Goal: Understand basic principles of dietary content, such as calories, fat, sodium, cholesterol and nutrients.;Short Term Goal: A plan has been developed with personal nutrition goals set during dietitian appointment.;Long Term Goal: Adherence to prescribed nutrition plan.      Nutrition Discharge: Rate Your Plate Scores:     Nutrition Assessments - 10/17/16 1250      Rate Your Plate Scores   Pre Score 67   Pre Score % 79.8 %      Nutrition Goals Re-Evaluation:   Psychosocial: Target Goals: Acknowledge presence or absence of depression, maximize coping skills, provide positive support system. Participant is able to verbalize types and ability to use techniques and skills needed for reducing stress and depression.  Initial Review & Psychosocial Screening:     Initial Psych Review & Screening - 10/17/16 1303      Barriers   Psychosocial barriers to participate in program There are no identifiable barriers or psychosocial needs.;The patient should benefit from training in stress management and  relaxation.     Screening Interventions   Interventions Encouraged to exercise      Quality of Life Scores:     Quality of Life - 10/17/16 1303      Quality of Life Scores   Health/Function Pre 19.53 %   Socioeconomic Pre 22.29 %   Psych/Spiritual Pre 17.75 %   Family Pre 27.6 %   GLOBAL Pre 21.02 %      PHQ-9: Recent Review Flowsheet Data    Depression screen [facility] 2/9 10/17/2016   Decreased Interest 0   Down, Depressed, Hopeless 1   PHQ - 2 Score 1   Altered sleeping 0   Tired, decreased energy 1    Change in appetite 0   Feeling bad or failure about yourself  1   Moving slowly or fidgety/restless 0   Suicidal thoughts 0   PHQ-9 Score 3   Difficult doing work/chores Not difficult at all      Psychosocial Evaluation and Intervention:   Psychosocial Re-Evaluation:   Vocational Rehabilitation: Provide vocational rehab assistance to qualifying candidates.   Vocational Rehab Evaluation & Intervention:     Vocational Rehab - 10/17/16 1307      Initial Vocational Rehab Evaluation & Intervention   Assessment shows need for Vocational Rehabilitation No      Education: Education Goals: Education classes will be provided on a  weekly basis, covering required topics. Participant will state understanding/return demonstration of topics presented.  Learning Barriers/Preferences:     Learning Barriers/Preferences - 10/17/16 1306      Learning Barriers/Preferences   Learning Barriers None   Learning Preferences Skilled Demonstration      Education Topics: General Nutrition Guidelines/Fats and Fiber: -Group instruction provided by verbal, written material, models and posters to present the general guidelines for heart healthy nutrition. Gives an explanation and review of dietary fats and fiber.   Controlling Sodium/Reading Food Labels: -Group verbal and written material supporting the discussion of sodium use in heart healthy nutrition. Review and explanation  with models, verbal and written materials for utilization of the food label.   Exercise Physiology & Risk Factors: - Group verbal and written instruction with models to review the exercise physiology of the cardiovascular system and associated critical values. Details cardiovascular disease risk factors and the goals associated with each risk factor.   Aerobic Exercise & Resistance Training: - Gives group verbal and written discussion on the health impact of inactivity. On the components of aerobic and resistive training programs and the benefits of this training and how to safely progress through these programs.   Flexibility, Balance, General Exercise Guidelines: - Provides group verbal and written instruction on the benefits of flexibility and balance training programs. Provides general exercise guidelines with specific guidelines to those with heart or lung disease. Demonstration and skill practice provided.   Stress Management: - Provides group verbal and written instruction about the health risks of elevated stress, cause of high stress, and healthy ways to reduce stress.   Depression: - Provides group verbal and written instruction on the correlation between heart/lung disease and depressed mood, treatment options, and the stigmas associated with seeking treatment.   Anatomy & Physiology of the Heart: - Group verbal and written instruction and models provide basic cardiac anatomy and physiology, with the coronary electrical and arterial systems. Review of: AMI, Angina, Valve disease, Heart Failure, Cardiac Arrhythmia, Pacemakers, and the ICD.   Cardiac Procedures: - Group verbal and written instruction and models to describe the testing methods done to diagnose heart disease. Reviews the outcomes of the test results. Describes the treatment choices: Medical Management, Angioplasty, or Coronary Bypass Surgery.   Cardiac Medications: - Group verbal and written instruction to  review commonly prescribed medications for heart disease. Reviews the medication, class of the drug, and side effects. Includes the steps to properly store meds and maintain the prescription regimen.   Go Sex-Intimacy & Heart Disease, Get SMART - Goal Setting: - Group verbal and written instruction through game format to discuss heart disease and the return to sexual intimacy. Provides group verbal and written material to discuss and apply goal setting through the application of the S.M.A.R.T. Method.   Other Matters of the Heart: - Provides group verbal, written materials and models to describe Heart Failure, Angina, Valve Disease, and Diabetes in the realm of heart disease. Includes description of the disease process and treatment options available to the cardiac patient.   Exercise & Equipment Safety: - Individual verbal instruction and demonstration of equipment use and safety with use of the equipment. Flowsheet Row Cardiac Rehab from 10/17/2016 in [facility] Cardiac and Pulmonary Rehab  Date  10/17/16  Educator  SB  Instruction Review Code  2- meets goals/outcomes      Infection Prevention: - Provides verbal and written material to individual with discussion of infection control including proper hand washing and proper equipment cleaning during  exercise session. Flowsheet Row Cardiac Rehab from 10/17/2016 in [facility] Cardiac and Pulmonary Rehab  Date  10/17/16  Educator  SB  Instruction Review Code  2- meets goals/outcomes      Falls Prevention: - Provides verbal and written material to individual with discussion of falls prevention and safety. Flowsheet Row Cardiac Rehab from 10/17/2016 in [facility] Cardiac and Pulmonary Rehab  Date  10/17/16  Educator  SB  Instruction Review Code  2- meets goals/outcomes      Diabetes: - Individual verbal and written instruction to review signs/symptoms of diabetes, desired ranges of glucose level fasting, after meals and with exercise. Advice that  pre and post exercise glucose checks will be done for 3 sessions at entry of program.    Knowledge Questionnaire Score:     Knowledge Questionnaire Score - 10/17/16 1306      Knowledge Questionnaire Score   Pre Score 23/28      Core Components/Risk Factors/Patient Goals at Admission:     Personal Goals and Risk Factors at Admission - 10/17/16 1252      Core Components/Risk Factors/Patient Goals on Admission    Weight Management Weight Loss;Yes   Intervention Weight Management: Develop a combined nutrition and exercise program designed to reach desired caloric intake, while maintaining appropriate intake of nutrient and fiber, sodium and fats, and appropriate energy expenditure required for the weight goal.;Weight Management: Provide education and appropriate resources to help participant work on and attain dietary goals.   Admit Weight 139 lb 9.6 oz (63.3 kg)   Goal Weight: Short Term 135 lb (61.2 kg)   Goal Weight: Long Term 135 lb (61.2 kg)   Expected Outcomes Short Term: Continue to assess and modify interventions until short term weight is achieved;Long Term: Adherence to nutrition and physical activity/exercise program aimed toward attainment of established weight goal;Weight Loss: Understanding of general recommendations for a balanced deficit meal plan, which promotes 1-2 lb weight loss per week and includes a negative energy balance of [phone] kcal/d;Understanding recommendations for meals to include 15-35% energy as protein, 25-35% energy from fat, 35-60% energy from carbohydrates, less than 200mg  of dietary cholesterol, 20-35 gm of total fiber daily;Understanding of distribution of calorie intake throughout the day with the consumption of 4-5 meals/snacks   Increase Strength and Stamina Yes  regain muscle mass   Intervention Provide advice, education, support and counseling about physical activity/exercise needs.;Develop an individualized exercise prescription for aerobic and  resistive training based on initial evaluation findings, risk stratification, comorbidities and participant's personal goals.   Expected Outcomes Achievement of increased cardiorespiratory fitness and enhanced flexibility, muscular endurance and strength shown through measurements of functional capacity and personal statement of participant.   Hypertension Yes  Diagnosed during stress test. States that BP has been good prior.  Will watch and counsel as needed.   Intervention Provide education on lifestyle modifcations including regular physical activity/exercise, weight management, moderate sodium restriction and increased consumption of fresh fruit, vegetables, and low fat dairy, alcohol moderation, and smoking cessation.;Monitor prescription use compliance.   Expected Outcomes Short Term: Continued assessment and intervention until BP is < 140/24mm HG in hypertensive participants. < 130/102mm HG in hypertensive participants with diabetes, heart failure or chronic kidney disease.;Long Term: Maintenance of blood pressure at goal levels.   Stress Yes  UNable to get back to full exercise with helps her keep her stress levels low.    Intervention Offer individual and/or small group education and counseling on adjustment to heart disease, stress management and health-related  lifestyle change. Teach and support self-help strategies.   Expected Outcomes Short Term: Participant demonstrates changes in health-related behavior, relaxation and other stress management skills, ability to obtain effective social support, and compliance with psychotropic medications if prescribed.;Long Term: Emotional wellbeing is indicated by absence of clinically significant psychosocial distress or social isolation.  Get Vika back to her daily exercise routine.      Core Components/Risk Factors/Patient Goals Review:    Core Components/Risk Factors/Patient Goals at Discharge (Final Review):    ITP Comments:     ITP  Comments    Row Name 10/17/16 1244           ITP Comments Med review completed . Initial ITPcompleted . Disgnosis documentation can be found in [facility]  Encounter 10/03/2016          Comments: Initial ITP

## 2016-10-18 ENCOUNTER — Encounter: Payer: Self-pay | Admitting: Physician Assistant

## 2016-10-19 NOTE — Progress Notes (Signed)
Cardiology Office Note Date:  10/20/2016  Patient ID:  Kim, Villarreal 02-Aug-1971, MRN [id number] PCP:  Minette Headland, NP  Cardiologist:  Dr. Fletcher Anon, MD    Chief Complaint: Hospital follow up for diagnostic cardiac cath for unstable angina s/p PCI/DES to mid LAD  History of Present Illness: Kim Villarreal is a 45 y.o. female with history of recently diagnosed CAD s/p PCI/DES to the mid LAD for unstable angina on 10/05/16, palpitations with possible PVCs, prior tobacco abuse beginning at age 44 quitting approximately 10 years ago, anxiety, and family history of CAD who presents for hospital follow up for diagnostic cardiac cath as above.   She was recently seen on 08/05/2016 for evaluation of chest pain with exercise and SOB. Pain at that time was substernal and radiated down her bilateral arms to her elbows. BP had been somewhat elevated in the Q000111Q systolic range. She also noted increased anxiety at the same time. She underwent echo on 08/24/16 that showed a vigorous LV systolic function with an EF of 65-70%, normal wall motion, normal LV diastolic function, and mild mitral regurgitation. She also underwent a treadmill Myoview on 08/11/2016 that showed no significant ischemia, normal wall motion, no EKG changes concerning for ischemia, small region of fixed perfusion defect in the mid to apical anteroseptal region c/w breast attenuation artifact, peak BP of 219/72, overall low-risk study. She was started on losartan given elevated BP. She followed up with PCP on 10/25 noting continued chest pain that involved her entire chest and radiated down her arms. There was associated decreased appetite. Pain was worse with movement and had tried Prilosec OTC without relief. Thought was possible MSK etiology and she was prescribed prednisone, though the patient did not take this. CXR through PCP was negative upon review. She continued to have chest pain with associated diaphoresis that was much worse  with exertion, also newly noted at rest, and when laying supine at her follow up on 10/03/16. BP had been well controlled since starting losartan. Because of her ongoing symptoms she was scheduled for a LHC on 10/05/16 for unstable angina. Cardiac cath on 10/05/16 showed severe one-vessel CAD with 95% stenosis in the mid LAD s/p PCI/DES with a Xience Alpine 2.5 x 15 mm DES with 0% residual stenosis. Normal LV systolic function and mildly elevated LVEDP. Remaining left and right coronary tree were angiographically normal. She did have some mild lip swelling at the end of the case without rash or respiratory distress and was treated with IV Solu-Medrol and Benadryl.   Since her cardiac cath, she has done well. No further chest pain or reflux-like symptoms. Has re-introduced her exercise program slowly and is tolerating this without issues. She has been taking Plavix every other day with ASA daily. She reports significant bruising on her legs when she bumps into things. She has also been taking Lipitor every other day 2/2 myalgias. Since taking Lipitor every other day she is tolerating the medication without issues. BP in the 1-teens to 123456' systolic at home. Has seen cardiac rehab. Wants to just rehab at her gym.    Past Medical History:  Diagnosis Date  . Anemia    "as a child"  . Anxiety   . Bursitis   . Coronary artery disease    a. 09/2016: cath showing 95% stenosis of the mid-LAD. Xience 2.5x6mm DES placed, remaining left and right coronary tree angiographically normal, lip swelling noted at the end of the case without rash or  respiratory distress, treated with IV Solu-Medrol and Benadryl.  Marland Kitchen History of nuclear stress test    a. 07/2016: no sig ischemia, nl wall motion, EF 72%, small defect along mid to apical anteroseptal wall c/w breast attenuation artifact, low risk study  . Hypertension   . Migraine    "it was a bout; beginning of the year; for ~ 1 month; went away" (10/05/2016)  . Mitral  regurgitation    a. echo 08/2016: vigorous EF 65-70%, nl wall motion, nl LV diastolic fxn, mild MR  . Tendonitis     Past Surgical History:  Procedure Laterality Date  . CARDIAC CATHETERIZATION N/A 10/05/2016   Procedure: Left Heart Cath and Coronary Angiography;  Surgeon: Wellington Hampshire, MD;  Location: [facility];  Service: Cardiovascular;  Laterality: N/A;  . CARDIAC CATHETERIZATION N/A 10/05/2016   Procedure: Coronary Stent Intervention;  Surgeon: Wellington Hampshire, MD;  Location: [facility];  Service: Cardiovascular;  Laterality: N/A;  . CESAREAN SECTION  1991; 2003; 2005  . CORONARY ANGIOPLASTY WITH STENT PLACEMENT  10/05/2016   "1 stent"    Current Outpatient Prescriptions  Medication Sig Dispense Refill  . aspirin EC 81 MG tablet Take 1 tablet (81 mg total) by mouth daily. 90 tablet 3  . atorvastatin (LIPITOR) 40 MG tablet Take 1 tablet (40 mg total) by mouth daily at 6 PM. 30 tablet 11  . Cholecalciferol (VITAMIN D-1000 MAX ST) 1000 units tablet Take 2,000 Units by mouth daily.     . clopidogrel (PLAVIX) 75 MG tablet Take 1 tablet (75 mg total) by mouth daily with breakfast. 30 tablet 11  . Coenzyme Q10 (COQ-10) 200 MG CAPS Take 200 mg by mouth every other day.    . losartan (COZAAR) 25 MG tablet Take 1 tablet (25 mg total) by mouth daily. 30 tablet 3  . Magnesium Gluconate POWD Apply 1 application topically daily.    . nitrofurantoin (MACRODANTIN) 25 MG capsule Take 25 mg by mouth as needed.     . nitroGLYCERIN (NITROSTAT) 0.4 MG SL tablet Place 1 tablet (0.4 mg total) under the tongue every 5 (five) minutes as needed for chest pain. 25 tablet 2   No current facility-administered medications for this visit.     Allergies:   Codeine   Social History:  The patient  reports that she quit smoking about 10 years ago. Her smoking use included Cigarettes. She has a 11.25 pack-year smoking history. She has never used smokeless tobacco. She reports that she drinks  alcohol. She reports that she does not use drugs.   Family History:  The patient's family history includes Heart disease in her mother.  ROS:   Review of Systems  Constitutional: Negative for chills, diaphoresis, fever, malaise/fatigue and weight loss.  HENT: Negative for congestion.   Eyes: Negative for discharge and redness.  Respiratory: Negative for cough, hemoptysis, sputum production, shortness of breath and wheezing.   Cardiovascular: Negative for chest pain, palpitations, orthopnea, claudication, leg swelling and PND.  Gastrointestinal: Negative for abdominal pain, blood in stool, heartburn, melena, nausea and vomiting.  Genitourinary: Negative for hematuria.  Musculoskeletal: Negative for falls and myalgias.  Skin: Negative for rash.  Neurological: Negative for dizziness, tingling, tremors, sensory change, speech change, focal weakness, loss of consciousness and weakness.  Endo/Heme/Allergies: Bruises/bleeds easily.  Psychiatric/Behavioral: Negative for substance abuse. The patient is not nervous/anxious.   All other systems reviewed and are negative.    PHYSICAL EXAM:  VS:  BP 138/72 (BP  Location: Left Arm, Patient Position: Sitting, Cuff Size: Normal)   Pulse 68   Ht 5' 6.5" (1.689 m)   Wt 143 lb 8 oz (65.1 kg)   LMP 09/24/2016   BMI 22.81 kg/m  BMI: Body mass index is 22.81 kg/m.  Physical Exam  Constitutional: She is oriented to person, place, and time. She appears well-developed and well-nourished.  HENT:  Head: Normocephalic and atraumatic.  Eyes: Right eye exhibits no discharge. Left eye exhibits no discharge.  Neck: Normal range of motion. No JVD present.  Cardiovascular: Normal rate, regular rhythm, S1 normal, S2 normal and normal heart sounds.  Exam reveals no distant heart sounds, no friction rub, no midsystolic click and no opening snap.   No murmur heard. Right radial cardiac cath site without bleeding, bruising, swelling, or TTP. Distal pulse 2+.     Pulmonary/Chest: Effort normal and breath sounds normal. No respiratory distress. She has no decreased breath sounds. She has no wheezes. She has no rales. She exhibits no tenderness.  Abdominal: Soft. She exhibits no distension. There is no tenderness.  Musculoskeletal: She exhibits no edema.  Neurological: She is alert and oriented to person, place, and time.  Skin: Skin is warm and dry. No cyanosis. Nails show no clubbing.  Psychiatric: She has a normal mood and affect. Her speech is normal and behavior is normal. Judgment and thought content normal.     EKG:  Was ordered and interpreted by me today. Shows NSR, 68 bpm, no acute st/t changes  Recent Labs: 10/06/2016: BUN 13; Creatinine, Ser 0.91; Hemoglobin 12.9; Platelets 168; Potassium 3.7; Sodium 139  No results found for requested labs within last 8760 hours.   Estimated Creatinine Clearance: 75.3 mL/min (by C-G formula based on SCr of 0.91 mg/dL).   Wt Readings from Last 3 Encounters:  10/20/16 143 lb 8 oz (65.1 kg)  10/17/16 139 lb 9.6 oz (63.3 kg)  10/06/16 141 lb 1.5 oz (64 kg)     Other studies reviewed: Additional studies/records reviewed today include: summarized above  ASSESSMENT AND PLAN:  1. CAD s/p PCI/DES to mid LAD as above: No further symptoms concerning for angina. She has been taking Plavix every other day with ASA daily. Discussed with interventional cardiology who advises patient to take both ASA and Plavix daily. Patient was advised of this and agrees to do so. Continue Lipitor. She declines metoprolol. 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 No plans for further ischemic evaluation at this time.   2. HTN: Well controlled with readings in the 1-teens to Q000111Q systolic at home. Continue losartan 25 mg daily. She declines metoprolol at this time as above.   3. Prior tobacco abuse: No further tobacco abuse. Continue to abstain from tobacco.   4. Subclinical  hypokalemia: Eat diet high in potassium.   Disposition: F/u with Dr. Fletcher Anon, MD in 2 months.   Current medicines are reviewed at length with the patient today.  The patient did not have any concerns regarding medicines.  Melvern Banker PA-C 10/20/2016 2:10 PM     [address] [place], [place] [address] [phone]

## 2016-10-20 ENCOUNTER — Ambulatory Visit (INDEPENDENT_AMBULATORY_CARE_PROVIDER_SITE_OTHER): Admitting: Physician Assistant

## 2016-10-20 ENCOUNTER — Encounter

## 2016-10-20 ENCOUNTER — Telehealth: Payer: Self-pay | Admitting: *Deleted

## 2016-10-20 ENCOUNTER — Encounter: Payer: Self-pay | Admitting: Physician Assistant

## 2016-10-20 VITALS — BP 138/72 | HR 68 | Ht 66.5 in | Wt 143.5 lb

## 2016-10-20 DIAGNOSIS — Z79899 Other long term (current) drug therapy: Secondary | ICD-10-CM | POA: Diagnosis not present

## 2016-10-20 DIAGNOSIS — I10 Essential (primary) hypertension: Secondary | ICD-10-CM | POA: Diagnosis not present

## 2016-10-20 DIAGNOSIS — I2510 Atherosclerotic heart disease of native coronary artery without angina pectoris: Secondary | ICD-10-CM

## 2016-10-20 DIAGNOSIS — Z87891 Personal history of nicotine dependence: Secondary | ICD-10-CM | POA: Diagnosis not present

## 2016-10-20 NOTE — Telephone Encounter (Signed)
Today at office visit, Christell Faith, PA-C, advised patient she did not have to participate in the Cardiac Rehab program. Called Cardiac Rehab and let them know of this decision.  Florentina Jenny at [facility] said she will let the nurse know.

## 2016-10-20 NOTE — Patient Instructions (Addendum)
Medication Instructions:  Continue current medications.   Labwork: Your physician recommends that you return for lab work in: TODAY. (CBC)   Follow-Up: Your physician recommends that you schedule a follow-up appointment in: [place].  Ryan Dunn PA-C said It is ok for you not to participate in Cardiac Rehab. You may continue with your current exercise program at your gym.  If you need a refill on your cardiac medications before your next appointment, please call your pharmacy.

## 2016-10-21 ENCOUNTER — Encounter: Payer: Self-pay | Admitting: *Deleted

## 2016-10-21 DIAGNOSIS — Z955 Presence of coronary angioplasty implant and graft: Secondary | ICD-10-CM

## 2016-10-21 LAB — CBC WITH DIFFERENTIAL/PLATELET
BASOS: 1 %
Basophils Absolute: 0 10*3/uL (ref 0.0–0.2)
EOS (ABSOLUTE): 0.1 10*3/uL (ref 0.0–0.4)
Eos: 2 %
HEMATOCRIT: 41.7 % (ref 34.0–46.6)
Hemoglobin: 13.8 g/dL (ref 11.1–15.9)
Immature Grans (Abs): 0 10*3/uL (ref 0.0–0.1)
Immature Granulocytes: 0 %
LYMPHS ABS: 1.5 10*3/uL (ref 0.7–3.1)
Lymphs: 23 %
MCH: 27.3 pg (ref 26.6–33.0)
MCHC: 33.1 g/dL (ref 31.5–35.7)
MCV: 83 fL (ref 79–97)
MONOS ABS: 0.5 10*3/uL (ref 0.1–0.9)
Monocytes: 7 %
NEUTROS ABS: 4.6 10*3/uL (ref 1.4–7.0)
NEUTROS PCT: 67 %
PLATELETS: 156 10*3/uL (ref 150–379)
RBC: 5.05 x10E6/uL (ref 3.77–5.28)
RDW: 15 % (ref 12.3–15.4)
WBC: 6.7 10*3/uL (ref 3.4–10.8)

## 2016-10-21 NOTE — Progress Notes (Signed)
Cardiac Individual Treatment Plan  Patient Details  Name: Kim Villarreal MRN: [id number] Date of Birth: [birth year] Referring Provider:   Flowsheet Row Cardiac Rehab from 10/17/2016 in [facility] Cardiac and Pulmonary Rehab  Referring Provider  [name] MD      Initial Encounter Date:  Flowsheet Row Cardiac Rehab from 10/17/2016 in [facility] Cardiac and Pulmonary Rehab  Date  10/17/16  Referring Provider  [name] MD      Visit Diagnosis: Status post coronary artery stent placement  Patient's Home Medications on Admission:  Current Outpatient Prescriptions:  .  aspirin EC 81 MG tablet, Take 1 tablet (81 mg total) by mouth daily., Disp: 90 tablet, Rfl: 3 .  atorvastatin (LIPITOR) 40 MG tablet, Take 1 tablet (40 mg total) by mouth daily at 6 PM., Disp: 30 tablet, Rfl: 11 .  Cholecalciferol (VITAMIN D-1000 MAX ST) 1000 units tablet, Take 2,000 Units by mouth daily. , Disp: , Rfl:  .  clopidogrel (PLAVIX) 75 MG tablet, Take 1 tablet (75 mg total) by mouth daily with breakfast., Disp: 30 tablet, Rfl: 11 .  Coenzyme Q10 (COQ-10) 200 MG CAPS, Take 200 mg by mouth every other day., Disp: , Rfl:  .  losartan (COZAAR) 25 MG tablet, Take 1 tablet (25 mg total) by mouth daily., Disp: 30 tablet, Rfl: 3 .  Magnesium Gluconate POWD, Apply 1 application topically daily., Disp: , Rfl:  .  nitrofurantoin (MACRODANTIN) 25 MG capsule, Take 25 mg by mouth as needed. , Disp: , Rfl:  .  nitroGLYCERIN (NITROSTAT) 0.4 MG SL tablet, Place 1 tablet (0.4 mg total) under the tongue every 5 (five) minutes as needed for chest pain., Disp: 25 tablet, Rfl: 2  Past Medical History: Past Medical History:  Diagnosis Date  . Anemia    "as a child"  . Anxiety   . Bursitis   . Coronary artery disease    a. 09/2016: cath showing 95% stenosis of the mid-LAD. Xience 2.5x83mm DES placed, remaining left and right coronary tree angiographically normal, lip swelling noted at the end of the case without rash or  respiratory distress, treated with IV Solu-Medrol and Benadryl.  Marland Kitchen History of nuclear stress test    a. 07/2016: no sig ischemia, nl wall motion, EF 72%, small defect along mid to apical anteroseptal wall c/w breast attenuation artifact, low risk study  . Hypertension   . Migraine    "it was a bout; beginning of the year; for ~ 1 month; went away" (10/05/2016)  . Mitral regurgitation    a. echo 08/2016: vigorous EF 65-70%, nl wall motion, nl LV diastolic fxn, mild MR  . Tendonitis     Tobacco Use: History  Smoking Status  . Former Smoker  . Packs/day: 0.75  . Years: 15.00  . Types: Cigarettes  . Quit date: 2007  Smokeless Tobacco  . Never Used    Labs: Recent Review Flowsheet Data    There is no flowsheet data to display.       Exercise Target Goals:    Exercise Program Goal: Individual exercise prescription set with THRR, safety & activity barriers. Participant demonstrates ability to understand and report RPE using BORG scale, to self-measure pulse accurately, and to acknowledge the importance of the exercise prescription.  Exercise Prescription Goal: Starting with aerobic activity 30 plus minutes a day, 3 days per week for initial exercise prescription. Provide home exercise prescription and guidelines that participant acknowledges understanding prior to discharge.  Activity Barriers & Risk Stratification:  Activity Barriers & Cardiac Risk Stratification - 10/17/16 1305      Activity Barriers & Cardiac Risk Stratification   Activity Barriers None   Cardiac Risk Stratification Moderate      6 Minute Walk:     6 Minute Walk    Row Name 10/17/16 1343         6 Minute Walk   Phase Initial     Distance 1850 feet     Walk Time 6 minutes     # of Rest Breaks 0     MPH 3.5     METS 3.61     RPE 7     VO2 Peak 19.65     Symptoms No     Resting HR 61 bpm     Resting BP 126/64     Max Ex. HR 102 bpm     Max Ex. BP 132/64     2 Minute Post BP 128/70         Initial Exercise Prescription:     Initial Exercise Prescription - 10/17/16 1300      Date of Initial Exercise RX and Referring Provider   Date 10/17/16   Referring Provider [name] MD     Elliptical   Level 3   Speed 4   Minutes 30     REL-XR   Level 3   Watts --  speed 50 rpm   Minutes 15   METs 3     Prescription Details   Frequency (times per week) 3   Duration Progress to 45 minutes of aerobic exercise without signs/symptoms of physical distress     Intensity   THRR 40-80% of Max Heartrate 107-153   Ratings of Perceived Exertion 11-15   Perceived Dyspnea 0-4     Progression   Progression Continue to progress workloads to maintain intensity without signs/symptoms of physical distress.     Resistance Training   Training Prescription Yes   Weight 3 lbs   Reps 10-12      Perform Capillary Blood Glucose checks as needed.  Exercise Prescription Changes:     Exercise Prescription Changes    Row Name 10/17/16 1300             Exercise Review   Progression -  walk test results         Response to Exercise   Blood Pressure (Admit) 126/64       Blood Pressure (Exercise) 132/64       Blood Pressure (Exit) 128/70       Heart Rate (Admit) 61 bpm       Heart Rate (Exercise) 102 bpm       Heart Rate (Exit) 63 bpm       Oxygen Saturation (Admit) 99 %       Oxygen Saturation (Exercise) 99 %       Rating of Perceived Exertion (Exercise) 7       Symptoms none          Exercise Comments:     Exercise Comments    Row Name 10/17/16 1347           Exercise Comments Biggest exercise goal is to be able to workout again without restrictions.          Discharge Exercise Prescription (Final Exercise Prescription Changes):     Exercise Prescription Changes - 10/17/16 1300      Exercise Review   Progression --  walk test results  Response to Exercise   Blood Pressure (Admit) 126/64   Blood Pressure (Exercise) 132/64   Blood  Pressure (Exit) 128/70   Heart Rate (Admit) 61 bpm   Heart Rate (Exercise) 102 bpm   Heart Rate (Exit) 63 bpm   Oxygen Saturation (Admit) 99 %   Oxygen Saturation (Exercise) 99 %   Rating of Perceived Exertion (Exercise) 7   Symptoms none      Nutrition:  Target Goals: Understanding of nutrition guidelines, daily intake of sodium 1500mg , cholesterol 200mg , calories 30% from fat and 7% or less from saturated fats, daily to have 5 or more servings of fruits and vegetables.  Biometrics:     Pre Biometrics - 10/17/16 1349      Pre Biometrics   Height 5' 6.8" (1.697 m)   Weight 139 lb 9.6 oz (63.3 kg)   Waist Circumference 30 inches   Hip Circumference 36 inches   Waist to Hip Ratio 0.83 %   BMI (Calculated) 22   Single Leg Stand 30 seconds       Nutrition Therapy Plan and Nutrition Goals:     Nutrition Therapy & Goals - 10/17/16 1250      Intervention Plan   Intervention Prescribe, educate and counsel regarding individualized specific dietary modifications aiming towards targeted core components such as weight, hypertension, lipid management, diabetes, heart failure and other comorbidities.   Expected Outcomes Short Term Goal: Understand basic principles of dietary content, such as calories, fat, sodium, cholesterol and nutrients.;Short Term Goal: A plan has been developed with personal nutrition goals set during dietitian appointment.;Long Term Goal: Adherence to prescribed nutrition plan.      Nutrition Discharge: Rate Your Plate Scores:     Nutrition Assessments - 10/17/16 1250      Rate Your Plate Scores   Pre Score 67   Pre Score % 79.8 %      Nutrition Goals Re-Evaluation:   Psychosocial: Target Goals: Acknowledge presence or absence of depression, maximize coping skills, provide positive support system. Participant is able to verbalize types and ability to use techniques and skills needed for reducing stress and depression.  Initial Review &  Psychosocial Screening:     Initial Psych Review & Screening - 10/17/16 1303      Barriers   Psychosocial barriers to participate in program There are no identifiable barriers or psychosocial needs.;The patient should benefit from training in stress management and relaxation.     Screening Interventions   Interventions Encouraged to exercise      Quality of Life Scores:     Quality of Life - 10/17/16 1303      Quality of Life Scores   Health/Function Pre 19.53 %   Socioeconomic Pre 22.29 %   Psych/Spiritual Pre 17.75 %   Family Pre 27.6 %   GLOBAL Pre 21.02 %      PHQ-9: Recent Review Flowsheet Data    Depression screen [facility] 2/9 10/17/2016   Decreased Interest 0   Down, Depressed, Hopeless 1   PHQ - 2 Score 1   Altered sleeping 0   Tired, decreased energy 1    Change in appetite 0   Feeling bad or failure about yourself  1   Moving slowly or fidgety/restless 0   Suicidal thoughts 0   PHQ-9 Score 3   Difficult doing work/chores Not difficult at all      Psychosocial Evaluation and Intervention:   Psychosocial Re-Evaluation:   Vocational Rehabilitation: Provide vocational rehab assistance to qualifying candidates.  Vocational Rehab Evaluation & Intervention:     Vocational Rehab - 10/17/16 1307      Initial Vocational Rehab Evaluation & Intervention   Assessment shows need for Vocational Rehabilitation No      Education: Education Goals: Education classes will be provided on a weekly basis, covering required topics. Participant will state understanding/return demonstration of topics presented.  Learning Barriers/Preferences:     Learning Barriers/Preferences - 10/17/16 1306      Learning Barriers/Preferences   Learning Barriers None   Learning Preferences Skilled Demonstration      Education Topics: General Nutrition Guidelines/Fats and Fiber: -Group instruction provided by verbal, written material, models and posters to present the general  guidelines for heart healthy nutrition. Gives an explanation and review of dietary fats and fiber.   Controlling Sodium/Reading Food Labels: -Group verbal and written material supporting the discussion of sodium use in heart healthy nutrition. Review and explanation with models, verbal and written materials for utilization of the food label.   Exercise Physiology & Risk Factors: - Group verbal and written instruction with models to review the exercise physiology of the cardiovascular system and associated critical values. Details cardiovascular disease risk factors and the goals associated with each risk factor.   Aerobic Exercise & Resistance Training: - Gives group verbal and written discussion on the health impact of inactivity. On the components of aerobic and resistive training programs and the benefits of this training and how to safely progress through these programs.   Flexibility, Balance, General Exercise Guidelines: - Provides group verbal and written instruction on the benefits of flexibility and balance training programs. Provides general exercise guidelines with specific guidelines to those with heart or lung disease. Demonstration and skill practice provided.   Stress Management: - Provides group verbal and written instruction about the health risks of elevated stress, cause of high stress, and healthy ways to reduce stress.   Depression: - Provides group verbal and written instruction on the correlation between heart/lung disease and depressed mood, treatment options, and the stigmas associated with seeking treatment.   Anatomy & Physiology of the Heart: - Group verbal and written instruction and models provide basic cardiac anatomy and physiology, with the coronary electrical and arterial systems. Review of: AMI, Angina, Valve disease, Heart Failure, Cardiac Arrhythmia, Pacemakers, and the ICD.   Cardiac Procedures: - Group verbal and written instruction and models to  describe the testing methods done to diagnose heart disease. Reviews the outcomes of the test results. Describes the treatment choices: Medical Management, Angioplasty, or Coronary Bypass Surgery.   Cardiac Medications: - Group verbal and written instruction to review commonly prescribed medications for heart disease. Reviews the medication, class of the drug, and side effects. Includes the steps to properly store meds and maintain the prescription regimen.   Go Sex-Intimacy & Heart Disease, Get SMART - Goal Setting: - Group verbal and written instruction through game format to discuss heart disease and the return to sexual intimacy. Provides group verbal and written material to discuss and apply goal setting through the application of the S.M.A.R.T. Method.   Other Matters of the Heart: - Provides group verbal, written materials and models to describe Heart Failure, Angina, Valve Disease, and Diabetes in the realm of heart disease. Includes description of the disease process and treatment options available to the cardiac patient.   Exercise & Equipment Safety: - Individual verbal instruction and demonstration of equipment use and safety with use of the equipment. Flowsheet Row Cardiac Rehab from 10/17/2016 in [facility]  Cardiac and Pulmonary Rehab  Date  10/17/16  Educator  SB  Instruction Review Code  2- meets goals/outcomes      Infection Prevention: - Provides verbal and written material to individual with discussion of infection control including proper hand washing and proper equipment cleaning during exercise session. Flowsheet Row Cardiac Rehab from 10/17/2016 in [facility] Cardiac and Pulmonary Rehab  Date  10/17/16  Educator  SB  Instruction Review Code  2- meets goals/outcomes      Falls Prevention: - Provides verbal and written material to individual with discussion of falls prevention and safety. Flowsheet Row Cardiac Rehab from 10/17/2016 in [facility] Cardiac and Pulmonary Rehab   Date  10/17/16  Educator  SB  Instruction Review Code  2- meets goals/outcomes      Diabetes: - Individual verbal and written instruction to review signs/symptoms of diabetes, desired ranges of glucose level fasting, after meals and with exercise. Advice that pre and post exercise glucose checks will be done for 3 sessions at entry of program.    Knowledge Questionnaire Score:     Knowledge Questionnaire Score - 10/17/16 1306      Knowledge Questionnaire Score   Pre Score 23/28      Core Components/Risk Factors/Patient Goals at Admission:     Personal Goals and Risk Factors at Admission - 10/17/16 1252      Core Components/Risk Factors/Patient Goals on Admission    Weight Management Weight Loss;Yes   Intervention Weight Management: Develop a combined nutrition and exercise program designed to reach desired caloric intake, while maintaining appropriate intake of nutrient and fiber, sodium and fats, and appropriate energy expenditure required for the weight goal.;Weight Management: Provide education and appropriate resources to help participant work on and attain dietary goals.   Admit Weight 139 lb 9.6 oz (63.3 kg)   Goal Weight: Short Term 135 lb (61.2 kg)   Goal Weight: Long Term 135 lb (61.2 kg)   Expected Outcomes Short Term: Continue to assess and modify interventions until short term weight is achieved;Long Term: Adherence to nutrition and physical activity/exercise program aimed toward attainment of established weight goal;Weight Loss: Understanding of general recommendations for a balanced deficit meal plan, which promotes 1-2 lb weight loss per week and includes a negative energy balance of [phone] kcal/d;Understanding recommendations for meals to include 15-35% energy as protein, 25-35% energy from fat, 35-60% energy from carbohydrates, less than 200mg  of dietary cholesterol, 20-35 gm of total fiber daily;Understanding of distribution of calorie intake throughout the day  with the consumption of 4-5 meals/snacks   Increase Strength and Stamina Yes  regain muscle mass   Intervention Provide advice, education, support and counseling about physical activity/exercise needs.;Develop an individualized exercise prescription for aerobic and resistive training based on initial evaluation findings, risk stratification, comorbidities and participant's personal goals.   Expected Outcomes Achievement of increased cardiorespiratory fitness and enhanced flexibility, muscular endurance and strength shown through measurements of functional capacity and personal statement of participant.   Hypertension Yes  Diagnosed during stress test. States that BP has been good prior.  Will watch and counsel as needed.   Intervention Provide education on lifestyle modifcations including regular physical activity/exercise, weight management, moderate sodium restriction and increased consumption of fresh fruit, vegetables, and low fat dairy, alcohol moderation, and smoking cessation.;Monitor prescription use compliance.   Expected Outcomes Short Term: Continued assessment and intervention until BP is < 140/18mm HG in hypertensive participants. < 130/88mm HG in hypertensive participants with diabetes, heart failure or chronic kidney disease.;Long  Term: Maintenance of blood pressure at goal levels.   Stress Yes  UNable to get back to full exercise with helps her keep her stress levels low.    Intervention Offer individual and/or small group education and counseling on adjustment to heart disease, stress management and health-related lifestyle change. Teach and support self-help strategies.   Expected Outcomes Short Term: Participant demonstrates changes in health-related behavior, relaxation and other stress management skills, ability to obtain effective social support, and compliance with psychotropic medications if prescribed.;Long Term: Emotional wellbeing is indicated by absence of clinically  significant psychosocial distress or social isolation.  Get Shantil back to her daily exercise routine.      Core Components/Risk Factors/Patient Goals Review:    Core Components/Risk Factors/Patient Goals at Discharge (Final Review):    ITP Comments:     ITP Comments    Row Name 10/17/16 1244 10/21/16 0957         ITP Comments Med review completed . Initial ITPcompleted . Disgnosis documentation can be found in [facility]  Encounter 10/03/2016 Dr. Tyrell Antonio office called to have patient discharged from program.         Comments: Discharge ITP

## 2016-10-24 ENCOUNTER — Ambulatory Visit

## 2016-10-26 ENCOUNTER — Ambulatory Visit

## 2016-10-27 ENCOUNTER — Ambulatory Visit

## 2016-10-31 ENCOUNTER — Ambulatory Visit

## 2016-11-02 ENCOUNTER — Ambulatory Visit

## 2016-11-03 ENCOUNTER — Ambulatory Visit

## 2016-11-07 ENCOUNTER — Ambulatory Visit

## 2016-11-09 ENCOUNTER — Ambulatory Visit

## 2016-11-10 ENCOUNTER — Ambulatory Visit

## 2016-11-16 ENCOUNTER — Ambulatory Visit

## 2016-11-17 ENCOUNTER — Ambulatory Visit

## 2016-11-23 ENCOUNTER — Ambulatory Visit

## 2016-11-24 ENCOUNTER — Ambulatory Visit

## 2016-11-28 ENCOUNTER — Other Ambulatory Visit
Admission: RE | Admit: 2016-11-28 | Discharge: 2016-11-28 | Disposition: A | Discharge status: Home / Self Care | Source: Ambulatory Visit | Attending: Cardiovascular Disease | Admitting: Cardiovascular Disease

## 2016-11-28 ENCOUNTER — Other Ambulatory Visit: Payer: Self-pay

## 2016-11-28 ENCOUNTER — Ambulatory Visit

## 2016-11-28 ENCOUNTER — Telehealth: Payer: Self-pay | Admitting: Cardiovascular Disease

## 2016-11-28 DIAGNOSIS — T466X5A Adverse effect of antihyperlipidemic and antiarteriosclerotic drugs, initial encounter: Principal | ICD-10-CM

## 2016-11-28 DIAGNOSIS — M6282 Rhabdomyolysis: Secondary | ICD-10-CM | POA: Diagnosis present

## 2016-11-28 LAB — BASIC METABOLIC PANEL
ANION GAP: 7 (ref 5–15)
BUN: 13 mg/dL (ref 6–20)
CALCIUM: 9.8 mg/dL (ref 8.9–10.3)
CO2: 26 mmol/L (ref 22–32)
Chloride: 104 mmol/L (ref 101–111)
Creatinine, Ser: 0.84 mg/dL (ref 0.44–1.00)
Glucose, Bld: 95 mg/dL (ref 65–99)
Potassium: 4.1 mmol/L (ref 3.5–5.1)
SODIUM: 137 mmol/L (ref 135–145)

## 2016-11-28 LAB — CK: CK TOTAL: 218 U/L (ref 38–234)

## 2016-11-28 NOTE — Telephone Encounter (Signed)
We have to make sure she does not have rhabdomyolysis. Agree with stopping Atorvastatin. Check CPK and BMP today.

## 2016-11-28 NOTE — Telephone Encounter (Signed)
Reviewed recommendations w/pt who verbalized understanding.  She will have labs drawn this afternoon at the [facility] as she is at work now. Lab orders placed.

## 2016-11-28 NOTE — Telephone Encounter (Signed)
Pt reports abdominal pain 4-5 times a day, muscles aches, gas and bloating, dark urine, blurry vision and headaches since beginning lipitor 40mg  in November. Last Sunday was "really bad and I couldn't get off the couch".  She only takes it qod as she doesn't like to take medications and feels she is on enough already.  She researched lipitor last evening and feels sx are r/t the medication therefore, she states she will stop taking it and work on her diet and exercise. Pt is not interested in trying another statin. Sept lipid results in Care Everywhere: cholesterol 180; LDL 113 Advised pt to f/u w/PCP now regarding blurry vision and headaches, to continue to monitor other sx and if they continue, notify PCP.  She will keep Jan 30 appt w/Dr. Fletcher Anon to further discuss. Routed to MD to make aware.

## 2016-11-28 NOTE — Telephone Encounter (Signed)
Pt states she thinks she is having a reaction to her Lipitor. States she is having abdominal pain, severe sometimes, gas, bloating, dark urine, blurry vision, headaches. States it has progressively getting worse since she started taking Lipitor.

## 2016-11-30 ENCOUNTER — Ambulatory Visit

## 2016-12-01 ENCOUNTER — Ambulatory Visit

## 2016-12-05 ENCOUNTER — Ambulatory Visit

## 2016-12-07 ENCOUNTER — Ambulatory Visit

## 2016-12-08 ENCOUNTER — Ambulatory Visit

## 2016-12-12 ENCOUNTER — Ambulatory Visit

## 2016-12-14 ENCOUNTER — Ambulatory Visit

## 2016-12-15 ENCOUNTER — Ambulatory Visit

## 2016-12-19 ENCOUNTER — Ambulatory Visit

## 2016-12-20 ENCOUNTER — Ambulatory Visit (INDEPENDENT_AMBULATORY_CARE_PROVIDER_SITE_OTHER): Admitting: Cardiovascular Disease

## 2016-12-20 ENCOUNTER — Encounter (INDEPENDENT_AMBULATORY_CARE_PROVIDER_SITE_OTHER): Payer: Self-pay

## 2016-12-20 ENCOUNTER — Other Ambulatory Visit: Payer: Self-pay

## 2016-12-20 ENCOUNTER — Encounter: Payer: Self-pay | Admitting: Cardiovascular Disease

## 2016-12-20 VITALS — BP 130/70 | HR 64 | Ht 66.5 in | Wt 147.0 lb

## 2016-12-20 DIAGNOSIS — E785 Hyperlipidemia, unspecified: Secondary | ICD-10-CM | POA: Diagnosis not present

## 2016-12-20 DIAGNOSIS — I10 Essential (primary) hypertension: Secondary | ICD-10-CM

## 2016-12-20 DIAGNOSIS — I2510 Atherosclerotic heart disease of native coronary artery without angina pectoris: Secondary | ICD-10-CM | POA: Diagnosis not present

## 2016-12-20 MED ORDER — ROSUVASTATIN CALCIUM 5 MG PO TABS: 5.0000 mg | ORAL_TABLET | Freq: Every day | ORAL | 2 refills | Status: DC

## 2016-12-20 MED ORDER — ROSUVASTATIN CALCIUM 5 MG PO TABS: 5.0000 mg | ORAL_TABLET | Freq: Every day | ORAL | 5 refills | Status: DC

## 2016-12-20 NOTE — Progress Notes (Signed)
Cardiology Office Note   Date:  12/20/2016   ID:  Kim Villarreal, DOB 1970-12-10, MRN [id number]  PCP:  Marcello Fennel, MD  Cardiologist:   [name], MD   Chief Complaint  Patient presents with  . Other    2 month f/u c/o chest pain believes because Anxiety. Pt d/c lipitor due to upset stomach. Meds reviewed verbally with pt.      History of Present Illness: Kim Villarreal is a 46 y.o. female who presents for a follow-up visit regarding coronary artery disease.  She is a previous smoker and quit about 10 years ago. She smoked for 15 years. She does have family history of coronary artery disease. She has no diabetes or hyperlipidemia.  Cardiac catheterization in November 2017 showed 95% stenosis in the mid LAD which was treated successfully with a drug-eluting stent placement. She had severe myalgia on atorvastatin. Labs showed normal CPK and renal function.She has been doing well overall with no exertional chest pain. She describes sharp chest pain moving from one side to another which happens at rest and never with physical activities. She is physically very active and does high-intensity physical exercise with no limitations.      Past Medical History:  Diagnosis Date  . Anemia    "as a child"  . Anxiety   . Bursitis   . Coronary artery disease    a. 09/2016: cath showing 95% stenosis of the mid-LAD. Xience 2.5x71mm DES placed, remaining left and right coronary tree angiographically normal, lip swelling noted at the end of the case without rash or respiratory distress, treated with IV Solu-Medrol and Benadryl.  Marland Kitchen History of nuclear stress test    a. 07/2016: no sig ischemia, nl wall motion, EF 72%, small defect along mid to apical anteroseptal wall c/w breast attenuation artifact, low risk study  . Hypertension   . Migraine    "it was a bout; beginning of the year; for ~ 1 month; went away" (10/05/2016)  . Mitral regurgitation    a. echo 08/2016: vigorous EF  65-70%, nl wall motion, nl LV diastolic fxn, mild MR  . Tendonitis     Past Surgical History:  Procedure Laterality Date  . CARDIAC CATHETERIZATION N/A 10/05/2016   Procedure: Left Heart Cath and Coronary Angiography;  Surgeon: Wellington Hampshire, MD;  Location: [facility];  Service: Cardiovascular;  Laterality: N/A;  . CARDIAC CATHETERIZATION N/A 10/05/2016   Procedure: Coronary Stent Intervention;  Surgeon: Wellington Hampshire, MD;  Location: [facility];  Service: Cardiovascular;  Laterality: N/A;  . CESAREAN SECTION  1991; 2003; 2005  . CORONARY ANGIOPLASTY WITH STENT PLACEMENT  10/05/2016   "1 stent"     Current Outpatient Prescriptions  Medication Sig Dispense Refill  . aspirin EC 81 MG tablet Take 1 tablet (81 mg total) by mouth daily. 90 tablet 3  . Cholecalciferol (VITAMIN D-1000 MAX ST) 1000 units tablet Take 2,000 Units by mouth daily.     . clopidogrel (PLAVIX) 75 MG tablet Take 1 tablet (75 mg total) by mouth daily with breakfast. 30 tablet 11  . Coenzyme Q10 (COQ-10) 200 MG CAPS Take 200 mg by mouth every other day.    . Magnesium Gluconate POWD Apply 1 application topically daily.    . nitrofurantoin (MACRODANTIN) 25 MG capsule Take 25 mg by mouth as needed.     . nitroGLYCERIN (NITROSTAT) 0.4 MG SL tablet Place 1 tablet (0.4 mg total) under the tongue  every 5 (five) minutes as needed for chest pain. 25 tablet 2  . losartan (COZAAR) 25 MG tablet Take 1 tablet (25 mg total) by mouth daily. 30 tablet 3  . rosuvastatin (CRESTOR) 5 MG tablet Take 1 tablet (5 mg total) by mouth daily. 90 tablet 2   No current facility-administered medications for this visit.     Allergies:   Codeine    Social History:  The patient  reports that she quit smoking about 11 years ago. Her smoking use included Cigarettes. She has a 11.25 pack-year smoking history. She has never used smokeless tobacco. She reports that she drinks alcohol. She reports that she does not use drugs.    Family History:  The patient's family history includes Heart disease in her mother.    ROS:  Please see the history of present illness.   Otherwise, review of systems are positive for none.   All other systems are reviewed and negative.    PHYSICAL EXAM: VS:  BP 130/70 (BP Location: Left Arm, Patient Position: Sitting, Cuff Size: Normal)   Pulse 64   Ht 5' 6.5" (1.689 m)   Wt 147 lb (66.7 kg)   BMI 23.37 kg/m  , BMI Body mass index is 23.37 kg/m. GEN: Well nourished, well developed, in no acute distress  HEENT: normal  Neck: no JVD, carotid bruits, or masses Cardiac: RRR; no  rubs, or gallops,no edema . One out of 6 systolic ejection murmur in the aortic area. Respiratory:  clear to auscultation bilaterally, normal work of breathing GI: soft, nontender, nondistended, + BS MS: no deformity or atrophy  Skin: warm and dry, no rash Neuro:  Strength and sensation are intact Psych: euthymic mood, full affect   EKG:  EKG is ordered today. The ekg ordered today demonstrates normal sinus rhythm with minimal LVH. No significant ST or T wave changes.   Recent Labs: 10/06/2016: Hemoglobin 12.9 10/20/2016: Platelets 156 11/28/2016: BUN 13; Creatinine, Ser 0.84; Potassium 4.1; Sodium 137    Lipid Panel No results found for: CHOL, TRIG, HDL, CHOLHDL, VLDL, LDLCALC, LDLDIRECT    Wt Readings from Last 3 Encounters:  12/20/16 147 lb (66.7 kg)  10/20/16 143 lb 8 oz (65.1 kg)  10/17/16 139 lb 9.6 oz (63.3 kg)      PAD Screen 08/05/2016  Previous PAD dx? No  Previous surgical procedure? No  Pain with walking? No  Feet/toe relief with dangling? No  Painful, non-healing ulcers? No  Extremities discolored? No      ASSESSMENT AND PLAN:  1.  Coronary artery disease involving native coronary arteries without angina:She is doing very well with no anginal symptoms even at high intensity workout. She has atypical chest pain which does not seem to be cardiac. Continue medical  therapy.  2. Hyperlipidemia: She did not tolerate atorvastatin due to abdominal pain and myalgia. CPK was normal. I elected to add small dose rosuvastatin 5 mg once daily. Check lipid and liver profile in 6 weeks.  3. Essential hypertension: Blood pressure is controlled on losartan.   Disposition:   FU with me in 6 months.  Signed,  [name], MD  12/20/2016 3:00 PM    [place]

## 2016-12-20 NOTE — Patient Instructions (Signed)
Medication Instructions:  Your physician has recommended you make the following change in your medication:  START taking rosuvastatin 5mg  once daily   Labwork: Lipid and liver profile in 6 weeks. Nothing to eat or drink after midnight the evening before your labs.   Testing/Procedures: none  Follow-Up: Your physician wants you to follow-up in: 6 months with Dr. Fletcher Anon.  You will receive a reminder letter in the mail two months in advance. If you don't receive a letter, please call our office to schedule the follow-up appointment.   Any Other Special Instructions Will Be Listed Below (If Applicable).     If you need a refill on your cardiac medications before your next appointment, please call your pharmacy.

## 2016-12-21 ENCOUNTER — Ambulatory Visit

## 2016-12-22 ENCOUNTER — Ambulatory Visit

## 2016-12-22 NOTE — Addendum Note (Signed)
Addended by: Britt Bottom on: 12/22/2016 08:47 AM   Modules accepted: Orders

## 2016-12-26 ENCOUNTER — Ambulatory Visit

## 2016-12-28 ENCOUNTER — Ambulatory Visit

## 2016-12-29 ENCOUNTER — Ambulatory Visit

## 2017-01-02 ENCOUNTER — Ambulatory Visit

## 2017-01-04 ENCOUNTER — Ambulatory Visit

## 2017-01-05 ENCOUNTER — Ambulatory Visit

## 2017-01-09 ENCOUNTER — Ambulatory Visit

## 2017-01-11 ENCOUNTER — Ambulatory Visit

## 2017-01-12 ENCOUNTER — Ambulatory Visit

## 2017-01-16 ENCOUNTER — Ambulatory Visit

## 2017-02-12 ENCOUNTER — Other Ambulatory Visit: Payer: Self-pay | Admitting: Cardiovascular Disease

## 2017-03-09 ENCOUNTER — Other Ambulatory Visit
Admission: RE | Admit: 2017-03-09 | Discharge: 2017-03-09 | Disposition: A | Discharge status: Home / Self Care | Source: Ambulatory Visit | Attending: Cardiovascular Disease | Admitting: Cardiovascular Disease

## 2017-03-09 DIAGNOSIS — E785 Hyperlipidemia, unspecified: Secondary | ICD-10-CM | POA: Diagnosis present

## 2017-03-09 LAB — HEPATIC FUNCTION PANEL
ALK PHOS: 52 U/L (ref 38–126)
ALT: 22 U/L (ref 14–54)
AST: 23 U/L (ref 15–41)
Albumin: 4.3 g/dL (ref 3.5–5.0)
BILIRUBIN TOTAL: 0.6 mg/dL (ref 0.3–1.2)
Bilirubin, Direct: <0.1 mg/dL — ABNORMAL LOW (ref 0.1–0.5)
TOTAL PROTEIN: 7.5 g/dL (ref 6.5–8.1)

## 2017-03-09 LAB — LIPID PANEL
Cholesterol: 135 mg/dL (ref 0–200)
HDL: 49 mg/dL (ref >40)
LDL Cholesterol: 75 mg/dL (ref 0–99)
Total CHOL/HDL Ratio: 2.8 RATIO
Triglycerides: 56 mg/dL (ref <150)
VLDL: 11 mg/dL (ref 0–40)

## 2017-03-13 ENCOUNTER — Telehealth: Payer: Self-pay | Admitting: Cardiovascular Disease

## 2017-03-13 NOTE — Telephone Encounter (Signed)
Pt prescribed plavix and aspirin after 10/05/16 stent placement.  Recommendations to continue for at least 6 months. Pt inquires if she may stop these in May stating she is always bruised. Scheduled for f/u w/Dr. Fletcher Anon in July. Routed to MD to advise.

## 2017-03-16 NOTE — Telephone Encounter (Signed)
6 months is usually the minimal duration of dual antiplatelet therapy after drug-eluting stent placement. we prefer one year. If it is just bruising without any other significant bleeding, I recommend continuing same medications for a total of one year after stent placement. After that, Plavix can be discontinued and aspirin should be continued indefinitely.

## 2017-03-16 NOTE — Telephone Encounter (Signed)
Reviewed Dr. Tyrell Antonio recommendations w/pt who verbalized understanding. She denies any significant bleeding, only a nosebleed two months ago then one month ago. Bleeding resolved on its own without any intervention. Reviewed s/s that she would need to report to [place]. She is agreeable to continue plavix and aspirin as recommended.

## 2017-03-30 ENCOUNTER — Telehealth: Payer: Self-pay | Admitting: Cardiovascular Disease

## 2017-03-30 NOTE — Telephone Encounter (Signed)
Pt went to her Chiropractor and they are wanting put her on a medication for her back   And she is on blood thinner   She is not sure if she is to be off this or for how long  Please advise.

## 2017-03-30 NOTE — Telephone Encounter (Signed)
Pt reports she pulled a muscle in her back on Friday.  PCP prescribed flexiril , prednisone 10mg  x 10 days and prilosec to help with possible stomach irritation r/t prednisone. She doesn't want to take prednisone or prilosec and prefers to take motrin. Pt would like permission to stop plavix x 1 week and take motrin for pain. 10/05/16 PCI w/dual antiplatelet therapy for at least 6 months.  Advised pt she should continue medications but she would like to have Dr. Tyrell Antonio opinion. Forward to MD.

## 2017-03-30 NOTE — Telephone Encounter (Signed)
She can try Tylenol instead of Motrin. If Tylenol is not effective, she can use Motrin. She does not have to stop Plavix for that. She can hold aspirin while she is taking Motrin.

## 2017-03-30 NOTE — Telephone Encounter (Signed)
Reviewed MD recommendations w/pt who verbalized understanding.

## 2017-06-22 ENCOUNTER — Telehealth: Payer: Self-pay | Admitting: Cardiovascular Disease

## 2017-06-22 NOTE — Telephone Encounter (Signed)
Pt calling stating she did a lot of research and online it says it is dangerous to be on blood thinner and Asprin long term  She would like to come off her blood thinner.  She states she's had Bloody noses at least once a month. They are so bad she almost had to go to ED   She is just afraid that is something does happen and she gets a real wound it could be bad. Please call back

## 2017-06-22 NOTE — Telephone Encounter (Signed)
Forwarded to Dr. Fletcher Anon for his review.

## 2017-06-25 NOTE — Telephone Encounter (Signed)
She can stop Plavix but should continue Aspirin 81 mg daily.

## 2017-06-26 NOTE — Telephone Encounter (Signed)
Left detailed message on cell phone (ok per DPR). Provided call back number if questions. Medication list updated.

## 2017-07-27 ENCOUNTER — Other Ambulatory Visit: Payer: Self-pay | Admitting: Cardiovascular Disease

## 2017-08-25 ENCOUNTER — Ambulatory Visit (INDEPENDENT_AMBULATORY_CARE_PROVIDER_SITE_OTHER): Admitting: Cardiovascular Disease

## 2017-08-25 ENCOUNTER — Encounter: Payer: Self-pay | Admitting: Cardiovascular Disease

## 2017-08-25 VITALS — BP 140/80 | HR 72 | Ht 66.5 in | Wt 149.2 lb

## 2017-08-25 DIAGNOSIS — I25118 Atherosclerotic heart disease of native coronary artery with other forms of angina pectoris: Secondary | ICD-10-CM

## 2017-08-25 DIAGNOSIS — E785 Hyperlipidemia, unspecified: Secondary | ICD-10-CM

## 2017-08-25 DIAGNOSIS — I10 Essential (primary) hypertension: Secondary | ICD-10-CM | POA: Diagnosis not present

## 2017-08-25 MED ORDER — METOPROLOL SUCCINATE ER 25 MG PO TB24: 25.0000 mg | ORAL_TABLET | Freq: Every day | ORAL | 3 refills | Status: DC

## 2017-08-25 NOTE — Patient Instructions (Signed)
Medication Instructions:  Your physician has recommended you make the following change in your medication:  START taking metoprolol 25mg  once daily   Labwork: none  Testing/Procedures: None.  Follow-Up: Your physician recommends that you schedule a follow-up appointment in: 2 months with Dr. Fletcher Anon.    Any Other Special Instructions Will Be Listed Below (If Applicable).     If you need a refill on your cardiac medications before your next appointment, please call your pharmacy.

## 2017-08-25 NOTE — Progress Notes (Signed)
Cardiology Office Note   Date:  08/25/2017   ID:  Kim Villarreal, DOB 09/14/1971, MRN [id number]  PCP:  Derinda Late, MD  Cardiologist:   [name], MD   Chief Complaint  Patient presents with  . other    6 month follow up. Meds reviewed by the pt. verbally. Pt. c/o chest tightness with rapid heart beats.       History of Present Illness: Kim Villarreal is a 46 y.o. female who presents for a follow-up visit regarding coronary artery disease.  She is a previous smoker and quit about 10 years ago. She smoked for 15 years. She does have family history of coronary artery disease. She has no diabetes or hyperlipidemia.  Cardiac catheterization in November 2017 showed 95% stenosis in the mid LAD which was treated successfully with a drug-eluting stent placement. She had severe myalgia on atorvastatin.  She is tolerating small dose rosuvastatin. Over the last 2 months, she has experienced intermittent episodes of substernal chest tightness and heaviness which happens with regular activities but then does not happen with intense workout. This is associated with shortness of breath. She feels that her heart rate has been increasing with exercise.     Past Medical History:  Diagnosis Date  . Anemia    "as a child"  . Anxiety   . Bursitis   . Coronary artery disease    a. 09/2016: cath showing 95% stenosis of the mid-LAD. Xience 2.5x14mm DES placed, remaining left and right coronary tree angiographically normal, lip swelling noted at the end of the case without rash or respiratory distress, treated with IV Solu-Medrol and Benadryl.  Marland Kitchen History of nuclear stress test    a. 07/2016: no sig ischemia, nl wall motion, EF 72%, small defect along mid to apical anteroseptal wall c/w breast attenuation artifact, low risk study  . Hypertension   . Migraine    "it was a bout; beginning of the year; for ~ 1 month; went away" (10/05/2016)  . Mitral regurgitation    a. echo 08/2016:  vigorous EF 65-70%, nl wall motion, nl LV diastolic fxn, mild MR  . Tendonitis     Past Surgical History:  Procedure Laterality Date  . CARDIAC CATHETERIZATION N/A 10/05/2016   Procedure: Left Heart Cath and Coronary Angiography;  Surgeon: Wellington Hampshire, MD;  Location: [facility];  Service: Cardiovascular;  Laterality: N/A;  . CARDIAC CATHETERIZATION N/A 10/05/2016   Procedure: Coronary Stent Intervention;  Surgeon: Wellington Hampshire, MD;  Location: [facility];  Service: Cardiovascular;  Laterality: N/A;  . CESAREAN SECTION  1991; 2003; 2005  . CORONARY ANGIOPLASTY WITH STENT PLACEMENT  10/05/2016   "1 stent"     Current Outpatient Prescriptions  Medication Sig Dispense Refill  . aspirin EC 81 MG tablet Take 1 tablet (81 mg total) by mouth daily. 90 tablet 3  . Cholecalciferol (VITAMIN D-1000 MAX ST) 1000 units tablet Take 2,000 Units by mouth daily.     Marland Kitchen losartan (COZAAR) 25 MG tablet TAKE 1 TABLET BY MOUTH DAILY 30 tablet 1  . Magnesium Gluconate POWD Apply 1 application topically daily.    . nitroGLYCERIN (NITROSTAT) 0.4 MG SL tablet Place 1 tablet (0.4 mg total) under the tongue every 5 (five) minutes as needed for chest pain. 25 tablet 2  . rosuvastatin (CRESTOR) 5 MG tablet Take 1 tablet (5 mg total) by mouth daily. 90 tablet 2   No current facility-administered medications for this visit.  Allergies:   Codeine    Social History:  The patient  reports that she quit smoking about 11 years ago. Her smoking use included Cigarettes. She has a 11.25 pack-year smoking history. She has never used smokeless tobacco. She reports that she drinks alcohol. She reports that she does not use drugs.   Family History:  The patient's family history includes Heart disease in her mother.    ROS:  Please see the history of present illness.   Otherwise, review of systems are positive for none.   All other systems are reviewed and negative.    PHYSICAL EXAM: VS:  BP  140/80 (BP Location: Left Arm, Patient Position: Sitting, Cuff Size: Normal)   Pulse 72   Ht 5' 6.5" (1.689 m)   Wt 149 lb 4 oz (67.7 kg)   BMI 23.73 kg/m  , BMI Body mass index is 23.73 kg/m. GEN: Well nourished, well developed, in no acute distress  HEENT: normal  Neck: no JVD, carotid bruits, or masses Cardiac: RRR; no  rubs, or gallops,no edema . 1/6 systolic ejection murmur in the aortic area. Respiratory:  clear to auscultation bilaterally, normal work of breathing GI: soft, nontender, nondistended, + BS MS: no deformity or atrophy  Skin: warm and dry, no rash Neuro:  Strength and sensation are intact Psych: euthymic mood, full affect   EKG:  EKG is ordered today. The ekg ordered today demonstrates normal sinus rhythm with minimal LVH. Poor R-wave progression in the anterior leads with possible old septal infarct.  Recent Labs: 10/20/2016: Hemoglobin 13.8; Platelets 156 11/28/2016: BUN 13; Creatinine, Ser 0.84; Potassium 4.1; Sodium 137 03/09/2017: ALT 22    Lipid Panel    Component Value Date/Time   CHOL 135 03/09/2017 0855   TRIG 56 03/09/2017 0855   HDL 49 03/09/2017 0855   CHOLHDL 2.8 03/09/2017 0855   VLDL 11 03/09/2017 0855   LDLCALC 75 03/09/2017 0855      Wt Readings from Last 3 Encounters:  08/25/17 149 lb 4 oz (67.7 kg)  12/20/16 147 lb (66.7 kg)  10/20/16 143 lb 8 oz (65.1 kg)      PAD Screen 08/05/2016  Previous PAD dx? No  Previous surgical procedure? No  Pain with walking? No  Feet/toe relief with dangling? No  Painful, non-healing ulcers? No  Extremities discolored? No      ASSESSMENT AND PLAN:  1.  Coronary artery disease involving native coronary arteries With other forms of angina: She is having atypical angina that happens with low level of exercise but not with intense workout which is somewhat unusual. I'm going to add metoprolol 25 mg once daily. Reevaluate symptoms in 2 months. If there is no improvement, she will require cardiac  catheterization has previous stress testing was not very helpful even in the presence of high-grade LAD stenosis. I asked her to notify me if symptoms worsen before then.  2. Hyperlipidemia: She is tolerating small dose rosuvastatin. Most recent LDL was 75.  3. Essential hypertension: Blood pressure is still mildly elevated. Metoprolol was added.   Disposition:   FU with me in 2 months.  Signed,  [name], MD  08/25/2017 2:04 PM    [place]

## 2017-10-10 ENCOUNTER — Other Ambulatory Visit: Payer: Self-pay | Admitting: Cardiovascular Disease

## 2017-10-10 NOTE — Telephone Encounter (Signed)
Pt would like a refill on Losartan

## 2017-10-30 ENCOUNTER — Ambulatory Visit: Admitting: Cardiovascular Disease

## 2017-11-01 ENCOUNTER — Telehealth: Payer: Self-pay | Admitting: Cardiovascular Disease

## 2017-11-01 NOTE — Telephone Encounter (Signed)
Second attempt  lmov for patient to call back and reschedule appointment that was on 10/30/17 with Dr Fletcher Anon Due to weather we were closed.  Will await to hear back from patient.

## 2017-11-08 ENCOUNTER — Encounter: Payer: Self-pay | Admitting: Cardiovascular Disease

## 2017-11-08 NOTE — Telephone Encounter (Signed)
3 attempts to r/s missed appt   lmov to call office   Mailed letter

## 2017-11-23 ENCOUNTER — Other Ambulatory Visit: Payer: Self-pay

## 2017-11-23 MED ORDER — LOSARTAN POTASSIUM 25 MG PO TABS: 25.0000 mg | ORAL_TABLET | Freq: Every day | ORAL | 0 refills | Status: DC

## 2018-02-27 ENCOUNTER — Other Ambulatory Visit: Payer: Self-pay | Admitting: Cardiovascular Disease

## 2018-03-02 ENCOUNTER — Telehealth: Payer: Self-pay | Admitting: Cardiovascular Disease

## 2018-03-02 NOTE — Telephone Encounter (Signed)
Lmov for patient to call back ° °

## 2018-03-02 NOTE — Telephone Encounter (Signed)
-----   Message from Anselm Pancoast, [place] sent at 02/28/2018  8:49 AM EDT ----- Please try patient again for a follow up. Patient is requesting refills.  Thanks, Ivin Booty

## 2018-03-02 NOTE — Telephone Encounter (Signed)
lmov to schedule  °

## 2018-03-06 NOTE — Telephone Encounter (Signed)
Lmov to schedule  

## 2018-03-14 ENCOUNTER — Encounter: Payer: Self-pay | Admitting: Cardiovascular Disease

## 2018-03-14 NOTE — Telephone Encounter (Signed)
Sent letter

## 2018-05-18 ENCOUNTER — Telehealth: Payer: Self-pay | Admitting: Cardiovascular Disease

## 2018-05-18 NOTE — Telephone Encounter (Signed)
I left a message for the patient to call back 

## 2018-05-18 NOTE — Telephone Encounter (Signed)
Pt states she had tightness in her chest after an intense workout. States she gets an "ache across my chest", full feeling, burning in her throat. Please call to discuss

## 2018-05-18 NOTE — Telephone Encounter (Signed)
We should see her in the office next week.

## 2018-05-18 NOTE — Telephone Encounter (Signed)
I spoke with the patient. She states she is currently at the beach, but has noticed over the last 2 weeks that she has been experiencing chest tightness after a high intensity boot camp workout on M/W/F's that she has been attending.  Chest tightness is midsternal. She has also noticed a burning sensation up in her throat and discomfort that radiates across her chest from arm pit to arm pit intermittently and not associated with exercise.  She reports being at the beach today and having no symptoms although she has been more active today.  Symptoms do not seem worse if she presses on her chest or with movement in general.  She has had occasional lightheadedness. She associates some of these symptoms as to what she felt prior to her previous PCI.  She is scheduled to come home from the beach on Sunday. She states she has expired NTG with her. I have offered to send a refill to a pharmacy [place] the beach, but she has declined. She is in no acute distress and having no active symptoms. I advised I will need to review with Dr. Fletcher Anon to see if he would like [place] to see her in the office for evaluation or possibly order a stress test for further evaluation. I have advised her we will call back with recommendations, but if symptoms worsen or become persistent she needs to report to the nearest ER for further evaluation. The patient voices understanding.

## 2018-05-21 NOTE — Telephone Encounter (Signed)
I called and spoke with the patient and advised her of Dr. Tyrell Antonio recommendations. I offered her an appt today at 3:00 pm with Thurmond Butts, [place] or tomorrow at 1:40 pm with Dr. Fletcher Anon. The patient has decided to come tomorrow. I inquired as to how she has bee over the weekend. She states she has had some chest discomfort going up and down stairs, but has not worked out.  I advised her to keep her follow up with Dr. Fletcher Anon tomorrow. The patient is agreeable.

## 2018-05-22 ENCOUNTER — Ambulatory Visit (INDEPENDENT_AMBULATORY_CARE_PROVIDER_SITE_OTHER): Admitting: Cardiovascular Disease

## 2018-05-22 ENCOUNTER — Encounter: Payer: Self-pay | Admitting: Cardiovascular Disease

## 2018-05-22 VITALS — BP 140/70 | HR 63 | Ht 66.5 in | Wt 136.2 lb

## 2018-05-22 DIAGNOSIS — I25110 Atherosclerotic heart disease of native coronary artery with unstable angina pectoris: Secondary | ICD-10-CM | POA: Diagnosis not present

## 2018-05-22 DIAGNOSIS — Z01818 Encounter for other preprocedural examination: Secondary | ICD-10-CM | POA: Diagnosis not present

## 2018-05-22 DIAGNOSIS — E785 Hyperlipidemia, unspecified: Secondary | ICD-10-CM | POA: Diagnosis not present

## 2018-05-22 DIAGNOSIS — I200 Unstable angina: Secondary | ICD-10-CM

## 2018-05-22 DIAGNOSIS — I10 Essential (primary) hypertension: Secondary | ICD-10-CM

## 2018-05-22 MED ORDER — LOSARTAN POTASSIUM 50 MG PO TABS: 50.0000 mg | ORAL_TABLET | Freq: Every day | ORAL | 3 refills | Status: DC

## 2018-05-22 MED ORDER — PREDNISONE 50 MG PO TABS: ORAL_TABLET | ORAL | 0 refills | Status: DC

## 2018-05-22 MED ORDER — DIPHENHYDRAMINE HCL 50 MG PO TABS: ORAL_TABLET | ORAL | 0 refills | Status: DC

## 2018-05-22 MED ORDER — ROSUVASTATIN CALCIUM 5 MG PO TABS: 5.0000 mg | ORAL_TABLET | Freq: Every day | ORAL | 3 refills | Status: DC

## 2018-05-22 MED ORDER — ALPRAZOLAM 0.25 MG PO TABS: 0.2500 mg | ORAL_TABLET | Freq: Every day | ORAL | 0 refills | Status: DC | PRN

## 2018-05-22 MED ORDER — NITROGLYCERIN 0.4 MG SL SUBL: 0.4000 mg | SUBLINGUAL_TABLET | SUBLINGUAL | 2 refills | Status: DC | PRN

## 2018-05-22 NOTE — Patient Instructions (Addendum)
Medication Instructions: INCREASE the Losartan 50 mg daily  If you need a refill on your cardiac medications before your next appointment, please call your pharmacy.   Follow-Up: Your physician wants you to follow-up in one month with Dr. Fletcher Anon.    Thank you for choosing Heartcare at [facility]!       [facility] [address], [place] [address] Dept: [phone] Loc: [phone]  PRISHA HILEY  05/22/2018  You are scheduled for a Cardiac Catheterization on Wednesday, July 3 with Dr. [name].  1. Please arrive at the [facility] (Main Entrance A) at [facility]: [address] [place], [place] [address] at 12:00 PM (two hours before your procedure to ensure your preparation). Free valet parking service is available.   Special note: Every effort is made to have your procedure done on time. Please understand that emergencies sometimes delay scheduled procedures.  2. Diet: Do not eat or drink anything after midnight prior to your procedure except sips of water to take medications. You may have clear liquids until 5 am.   3. Labs: Labs will be drawn today at the [place] office.  4. Medication instructions in preparation for your procedure: Prednisone:  13 hours prior to the procedure take one 50 mg tablet, 7 hours prior take another 50 mg tablet and then 1-2 hours prior to the procedure take the last 50 mg tablet.  Benadryl: Take one 50 mg tablet the morning of the procedure with the last dose of the prednisone.   On the morning of your procedure, take your Aspirin and any morning medicines NOT listed above.  You may use sips of water.  5. Plan for one night stay--bring personal belongings. 6. Bring a current list of your medications and current insurance cards. 7. You MUST have a responsible person to drive you home. 8. Someone MUST be with you the first 24  hours after you arrive home or your discharge will be delayed. 9. Please wear clothes that are easy to get on and off and wear slip-on shoes.  Thank you for allowing [place] to care for you!   -- [facility]

## 2018-05-22 NOTE — Progress Notes (Signed)
Cardiology Office Note   Date:  05/22/2018   ID:  Kim Villarreal, DOB 09-Dec-1970, MRN [id number]  PCP:  Kim Late, MD  Cardiologist:   [name], MD   Chief Complaint  Patient presents with  . other    Pt. c/o chest tightness in the mid sternum while having an intense workout and has occas. felt some burning in throat, nausea with chest discomfort and pain down her right arm. Meds reviewed by the pt. verbally.       History of Present Illness: Kim Villarreal is a 47 y.o. female who presents for a follow-up visit regarding coronary artery disease.  She is a previous smoker and quit about 10 years ago. She smoked for 15 years. She does have family history of coronary artery disease. She has no diabetes or hyperlipidemia.  Cardiac catheterization in November 2017 showed 95% stenosis in the mid LAD which was treated successfully with a drug-eluting stent placement. She had severe myalgia on atorvastatin.  She is tolerating small dose rosuvastatin. She was seen most recently by me in October.  At that time, she had intermittent exertional chest tightness.  Blood pressure was mildly elevated.  I added small dose Toprol  But she did not take the medication given that her symptoms resolved at that time. Over the last 3 weeks, she has experienced intermittent episodes of substernal chest tightness radiating to both arms mostly the right arm.  This happened initially after an intense [facility] but since then has been happening with less intense activities.  She was at the beach recently and she felt chest tightness with every exertion.  The symptoms seem to be getting worse.  The symptoms are very similar to what she had before her PCI.    Past Medical History:  Diagnosis Date  . Anemia    "as a child"  . Anxiety   . Bursitis   . Coronary artery disease    a. 09/2016: cath showing 95% stenosis of the mid-LAD. Xience 2.5x65mm DES placed, remaining left and right coronary tree  angiographically normal, lip swelling noted at the end of the case without rash or respiratory distress, treated with IV Solu-Medrol and Benadryl.  Marland Kitchen History of nuclear stress test    a. 07/2016: no sig ischemia, nl wall motion, EF 72%, small defect along mid to apical anteroseptal wall c/w breast attenuation artifact, low risk study  . Hypertension   . Migraine    "it was a bout; beginning of the year; for ~ 1 month; went away" (10/05/2016)  . Mitral regurgitation    a. echo 08/2016: vigorous EF 65-70%, nl wall motion, nl LV diastolic fxn, mild MR  . Tendonitis     Past Surgical History:  Procedure Laterality Date  . CARDIAC CATHETERIZATION N/A 10/05/2016   Procedure: Left Heart Cath and Coronary Angiography;  Surgeon: Wellington Hampshire, MD;  Location: [place] [facility];  Service: Cardiovascular;  Laterality: N/A;  . CARDIAC CATHETERIZATION N/A 10/05/2016   Procedure: Coronary Stent Intervention;  Surgeon: Wellington Hampshire, MD;  Location: [place] [facility];  Service: Cardiovascular;  Laterality: N/A;  . CESAREAN SECTION  1991; 2003; 2005  . CORONARY ANGIOPLASTY WITH STENT PLACEMENT  10/05/2016   "1 stent"     Current Outpatient Medications  Medication Sig Dispense Refill  . ALPRAZolam (XANAX) 0.25 MG tablet Take 0.25 mg by mouth daily as needed.    Marland Kitchen aspirin EC 81 MG tablet Take 1 tablet (81  mg total) by mouth daily. 90 tablet 3  . Cholecalciferol (VITAMIN D3) 5000 units CAPS Take by mouth.    . losartan (COZAAR) 25 MG tablet Take 1 tablet (25 mg total) by mouth daily. 90 tablet 0  . nitrofurantoin (MACRODANTIN) 25 MG capsule Take 25 mg by mouth daily as needed.    . nitroGLYCERIN (NITROSTAT) 0.4 MG SL tablet Place 1 tablet (0.4 mg total) under the tongue every 5 (five) minutes as needed for chest pain. 25 tablet 2  . Omega-3 Fatty Acids (FISH OIL PO) Take by mouth daily.    . rosuvastatin (CRESTOR) 5 MG tablet Take 1 tablet (5 mg total) by mouth daily. 90 tablet 2   No current  facility-administered medications for this visit.     Allergies:   Codeine    Social History:  The patient  reports that she quit smoking about 12 years ago. Her smoking use included cigarettes. She has a 11.25 pack-year smoking history. She has never used smokeless tobacco. She reports that she drinks alcohol. She reports that she does not use drugs.   Family History:  The patient's family history includes Heart disease in her mother.    ROS:  Please see the history of present illness.   Otherwise, review of systems are positive for none.   All other systems are reviewed and negative.    PHYSICAL EXAM: VS:  BP 140/70 (BP Location: Left Arm, Patient Position: Sitting, Cuff Size: Normal)   Pulse 63   Ht 5' 6.5" (1.689 m)   Wt 136 lb 4 oz (61.8 kg)   BMI 21.66 kg/m  , BMI Body mass index is 21.66 kg/m. GEN: Well nourished, well developed, in no acute distress  HEENT: normal  Neck: no JVD, carotid bruits, or masses Cardiac: RRR; no  rubs, or gallops,no edema . 2/6 systolic ejection murmur in the aortic area. Respiratory:  clear to auscultation bilaterally, normal work of breathing GI: soft, nontender, nondistended, + BS MS: no deformity or atrophy  Skin: warm and dry, no rash Neuro:  Strength and sensation are intact Psych: euthymic mood, full affect   EKG:  EKG is ordered today. The ekg ordered today demonstrates normal sinus rhythm with minimal LVH.  Poor R wave progression in the anterior leads.  Recent Labs: No results found for requested labs within last 8760 hours.    Lipid Panel    Component Value Date/Time   CHOL 135 03/09/2017 0855   TRIG 56 03/09/2017 0855   HDL 49 03/09/2017 0855   CHOLHDL 2.8 03/09/2017 0855   VLDL 11 03/09/2017 0855   LDLCALC 75 03/09/2017 0855      Wt Readings from Last 3 Encounters:  05/22/18 136 lb 4 oz (61.8 kg)  08/25/17 149 lb 4 oz (67.7 kg)  12/20/16 147 lb (66.7 kg)      PAD Screen 08/05/2016  Previous PAD dx? No    Previous surgical procedure? No  Pain with walking? No  Feet/toe relief with dangling? No  Painful, non-healing ulcers? No  Extremities discolored? No      ASSESSMENT AND PLAN:  1.  Coronary artery disease involving native coronary arteries with unstable angina: The patient has new symptoms of substernal chest tightness happening with minimal activities very similar to what she had before her PCI.  She had previous proximal LAD drug-eluting stent placement in 2017.  Due to the symptoms, I recommend proceeding with urgent cardiac catheterization and possible PCI.  I discussed the procedure in details  as well as risks and benefits.  Right radial pulse is mildly diminished and we will plan access via the left radial artery. She will be prepped for contrast allergy as she had lip swelling during last cardiac catheterization.  2. Hyperlipidemia: She is tolerating small dose rosuvastatin.  I reviewed most recent lipid profile done in March which showed an LDL of 66.  3. Essential hypertension: Blood pressure is mildly elevated.  I increase losartan to 50 mg daily.   Disposition:   FU with me in 1 month.  Signed,  [name], MD  05/22/2018 2:18 PM    [place]

## 2018-05-22 NOTE — H&P (View-Only) (Signed)
Cardiology Office Note   Date:  05/22/2018   ID:  Kim Villarreal, DOB August 15, 1971, MRN [id number]  PCP:  Derinda Late, MD  Cardiologist:   [name], MD   Chief Complaint  Patient presents with  . other    Pt. c/o chest tightness in the mid sternum while having an intense workout and has occas. felt some burning in throat, nausea with chest discomfort and pain down her right arm. Meds reviewed by the pt. verbally.       History of Present Illness: Kim Villarreal is a 47 y.o. female who presents for a follow-up visit regarding coronary artery disease.  She is a previous smoker and quit about 10 years ago. She smoked for 15 years. She does have family history of coronary artery disease. She has no diabetes or hyperlipidemia.  Cardiac catheterization in November 2017 showed 95% stenosis in the mid LAD which was treated successfully with a drug-eluting stent placement. She had severe myalgia on atorvastatin.  She is tolerating small dose rosuvastatin. She was seen most recently by me in October.  At that time, she had intermittent exertional chest tightness.  Blood pressure was mildly elevated.  I added small dose Toprol  But she did not take the medication given that her symptoms resolved at that time. Over the last 3 weeks, she has experienced intermittent episodes of substernal chest tightness radiating to both arms mostly the right arm.  This happened initially after an intense [facility] but since then has been happening with less intense activities.  She was at the beach recently and she felt chest tightness with every exertion.  The symptoms seem to be getting worse.  The symptoms are very similar to what she had before her PCI.    Past Medical History:  Diagnosis Date  . Anemia    "as a child"  . Anxiety   . Bursitis   . Coronary artery disease    a. 09/2016: cath showing 95% stenosis of the mid-LAD. Xience 2.5x49mm DES placed, remaining left and right coronary tree  angiographically normal, lip swelling noted at the end of the case without rash or respiratory distress, treated with IV Solu-Medrol and Benadryl.  Marland Kitchen History of nuclear stress test    a. 07/2016: no sig ischemia, nl wall motion, EF 72%, small defect along mid to apical anteroseptal wall c/w breast attenuation artifact, low risk study  . Hypertension   . Migraine    "it was a bout; beginning of the year; for ~ 1 month; went away" (10/05/2016)  . Mitral regurgitation    a. echo 08/2016: vigorous EF 65-70%, nl wall motion, nl LV diastolic fxn, mild MR  . Tendonitis     Past Surgical History:  Procedure Laterality Date  . CARDIAC CATHETERIZATION N/A 10/05/2016   Procedure: Left Heart Cath and Coronary Angiography;  Surgeon: Wellington Hampshire, MD;  Location: [place] [facility];  Service: Cardiovascular;  Laterality: N/A;  . CARDIAC CATHETERIZATION N/A 10/05/2016   Procedure: Coronary Stent Intervention;  Surgeon: Wellington Hampshire, MD;  Location: [place] [facility];  Service: Cardiovascular;  Laterality: N/A;  . CESAREAN SECTION  1991; 2003; 2005  . CORONARY ANGIOPLASTY WITH STENT PLACEMENT  10/05/2016   "1 stent"     Current Outpatient Medications  Medication Sig Dispense Refill  . ALPRAZolam (XANAX) 0.25 MG tablet Take 0.25 mg by mouth daily as needed.    Marland Kitchen aspirin EC 81 MG tablet Take 1 tablet (81  mg total) by mouth daily. 90 tablet 3  . Cholecalciferol (VITAMIN D3) 5000 units CAPS Take by mouth.    . losartan (COZAAR) 25 MG tablet Take 1 tablet (25 mg total) by mouth daily. 90 tablet 0  . nitrofurantoin (MACRODANTIN) 25 MG capsule Take 25 mg by mouth daily as needed.    . nitroGLYCERIN (NITROSTAT) 0.4 MG SL tablet Place 1 tablet (0.4 mg total) under the tongue every 5 (five) minutes as needed for chest pain. 25 tablet 2  . Omega-3 Fatty Acids (FISH OIL PO) Take by mouth daily.    . rosuvastatin (CRESTOR) 5 MG tablet Take 1 tablet (5 mg total) by mouth daily. 90 tablet 2   No current  facility-administered medications for this visit.     Allergies:   Codeine    Social History:  The patient  reports that she quit smoking about 12 years ago. Her smoking use included cigarettes. She has a 11.25 pack-year smoking history. She has never used smokeless tobacco. She reports that she drinks alcohol. She reports that she does not use drugs.   Family History:  The patient's family history includes Heart disease in her mother.    ROS:  Please see the history of present illness.   Otherwise, review of systems are positive for none.   All other systems are reviewed and negative.    PHYSICAL EXAM: VS:  BP 140/70 (BP Location: Left Arm, Patient Position: Sitting, Cuff Size: Normal)   Pulse 63   Ht 5' 6.5" (1.689 m)   Wt 136 lb 4 oz (61.8 kg)   BMI 21.66 kg/m  , BMI Body mass index is 21.66 kg/m. GEN: Well nourished, well developed, in no acute distress  HEENT: normal  Neck: no JVD, carotid bruits, or masses Cardiac: RRR; no  rubs, or gallops,no edema . 2/6 systolic ejection murmur in the aortic area. Respiratory:  clear to auscultation bilaterally, normal work of breathing GI: soft, nontender, nondistended, + BS MS: no deformity or atrophy  Skin: warm and dry, no rash Neuro:  Strength and sensation are intact Psych: euthymic mood, full affect   EKG:  EKG is ordered today. The ekg ordered today demonstrates normal sinus rhythm with minimal LVH.  Poor R wave progression in the anterior leads.  Recent Labs: No results found for requested labs within last 8760 hours.    Lipid Panel    Component Value Date/Time   CHOL 135 03/09/2017 0855   TRIG 56 03/09/2017 0855   HDL 49 03/09/2017 0855   CHOLHDL 2.8 03/09/2017 0855   VLDL 11 03/09/2017 0855   LDLCALC 75 03/09/2017 0855      Wt Readings from Last 3 Encounters:  05/22/18 136 lb 4 oz (61.8 kg)  08/25/17 149 lb 4 oz (67.7 kg)  12/20/16 147 lb (66.7 kg)      PAD Screen 08/05/2016  Previous PAD dx? No    Previous surgical procedure? No  Pain with walking? No  Feet/toe relief with dangling? No  Painful, non-healing ulcers? No  Extremities discolored? No      ASSESSMENT AND PLAN:  1.  Coronary artery disease involving native coronary arteries with unstable angina: The patient has new symptoms of substernal chest tightness happening with minimal activities very similar to what she had before her PCI.  She had previous proximal LAD drug-eluting stent placement in 2017.  Due to the symptoms, I recommend proceeding with urgent cardiac catheterization and possible PCI.  I discussed the procedure in details  as well as risks and benefits.  Right radial pulse is mildly diminished and we will plan access via the left radial artery. She will be prepped for contrast allergy as she had lip swelling during last cardiac catheterization.  2. Hyperlipidemia: She is tolerating small dose rosuvastatin.  I reviewed most recent lipid profile done in March which showed an LDL of 66.  3. Essential hypertension: Blood pressure is mildly elevated.  I increase losartan to 50 mg daily.   Disposition:   FU with me in 1 month.  Signed,  [name], MD  05/22/2018 2:18 PM    [place]

## 2018-05-23 ENCOUNTER — Encounter (HOSPITAL_COMMUNITY): Payer: Self-pay | Admitting: General Practice

## 2018-05-23 ENCOUNTER — Encounter (HOSPITAL_COMMUNITY)
Admission: RE | Disposition: A | Discharge status: Home / Self Care | Payer: Self-pay | Source: Ambulatory Visit | Attending: Cardiovascular Disease

## 2018-05-23 ENCOUNTER — Ambulatory Visit (HOSPITAL_COMMUNITY)
Admission: RE | Admit: 2018-05-23 | Discharge: 2018-05-24 | Disposition: A | Discharge status: Home / Self Care | Source: Ambulatory Visit | Attending: Cardiovascular Disease | Admitting: Cardiovascular Disease

## 2018-05-23 ENCOUNTER — Other Ambulatory Visit: Payer: Self-pay

## 2018-05-23 DIAGNOSIS — Y831 Surgical operation with implant of artificial internal device as the cause of abnormal reaction of the patient, or of later complication, without mention of misadventure at the time of the procedure: Secondary | ICD-10-CM | POA: Insufficient documentation

## 2018-05-23 DIAGNOSIS — Z9889 Other specified postprocedural states: Secondary | ICD-10-CM | POA: Insufficient documentation

## 2018-05-23 DIAGNOSIS — Z7982 Long term (current) use of aspirin: Secondary | ICD-10-CM | POA: Insufficient documentation

## 2018-05-23 DIAGNOSIS — Z87891 Personal history of nicotine dependence: Secondary | ICD-10-CM | POA: Diagnosis not present

## 2018-05-23 DIAGNOSIS — Z8249 Family history of ischemic heart disease and other diseases of the circulatory system: Secondary | ICD-10-CM | POA: Diagnosis not present

## 2018-05-23 DIAGNOSIS — Z955 Presence of coronary angioplasty implant and graft: Secondary | ICD-10-CM | POA: Diagnosis not present

## 2018-05-23 DIAGNOSIS — I10 Essential (primary) hypertension: Secondary | ICD-10-CM | POA: Diagnosis not present

## 2018-05-23 DIAGNOSIS — I200 Unstable angina: Secondary | ICD-10-CM | POA: Diagnosis present

## 2018-05-23 DIAGNOSIS — F419 Anxiety disorder, unspecified: Secondary | ICD-10-CM | POA: Insufficient documentation

## 2018-05-23 DIAGNOSIS — Z885 Allergy status to narcotic agent status: Secondary | ICD-10-CM | POA: Diagnosis not present

## 2018-05-23 DIAGNOSIS — E785 Hyperlipidemia, unspecified: Secondary | ICD-10-CM | POA: Insufficient documentation

## 2018-05-23 DIAGNOSIS — I25110 Atherosclerotic heart disease of native coronary artery with unstable angina pectoris: Secondary | ICD-10-CM | POA: Insufficient documentation

## 2018-05-23 DIAGNOSIS — I340 Nonrheumatic mitral (valve) insufficiency: Secondary | ICD-10-CM | POA: Insufficient documentation

## 2018-05-23 DIAGNOSIS — Z79899 Other long term (current) drug therapy: Secondary | ICD-10-CM | POA: Diagnosis not present

## 2018-05-23 DIAGNOSIS — T82855A Stenosis of coronary artery stent, initial encounter: Secondary | ICD-10-CM | POA: Diagnosis not present

## 2018-05-23 HISTORY — DX: Low back pain, unspecified: M54.50

## 2018-05-23 HISTORY — DX: Unspecified asthma, uncomplicated: J45.909

## 2018-05-23 HISTORY — PX: LEFT HEART CATH AND CORONARY ANGIOGRAPHY: CATH118249

## 2018-05-23 HISTORY — PX: CORONARY ANGIOPLASTY WITH STENT PLACEMENT: SHX49

## 2018-05-23 HISTORY — PX: CORONARY STENT INTERVENTION: CATH118234

## 2018-05-23 HISTORY — DX: Other chronic pain: G89.29

## 2018-05-23 HISTORY — DX: Low back pain: M54.5

## 2018-05-23 LAB — BASIC METABOLIC PANEL
BUN/Creatinine Ratio: 13 (ref 9–23)
BUN: 11 mg/dL (ref 6–24)
CALCIUM: 9.6 mg/dL (ref 8.7–10.2)
CHLORIDE: 105 mmol/L (ref 96–106)
CO2: 23 mmol/L (ref 20–29)
Creatinine, Ser: 0.82 mg/dL (ref 0.57–1.00)
GFR calc non Af Amer: 86 mL/min/{1.73_m2} (ref >59)
GFR, EST AFRICAN AMERICAN: 99 mL/min/{1.73_m2} (ref >59)
Glucose: 105 mg/dL — ABNORMAL HIGH (ref 65–99)
Potassium: 4.4 mmol/L (ref 3.5–5.2)
Sodium: 142 mmol/L (ref 134–144)

## 2018-05-23 LAB — CBC
HEMATOCRIT: 44.6 % (ref 34.0–46.6)
HEMOGLOBIN: 14.6 g/dL (ref 11.1–15.9)
MCH: 28.3 pg (ref 26.6–33.0)
MCHC: 32.7 g/dL (ref 31.5–35.7)
MCV: 87 fL (ref 79–97)
Platelets: 161 10*3/uL (ref 150–450)
RBC: 5.15 x10E6/uL (ref 3.77–5.28)
RDW: 13.9 % (ref 12.3–15.4)
WBC: 7 10*3/uL (ref 3.4–10.8)

## 2018-05-23 LAB — POCT ACTIVATED CLOTTING TIME
ACTIVATED CLOTTING TIME: 467 s
Activated Clotting Time: 626 seconds

## 2018-05-23 LAB — PREGNANCY, URINE: Preg Test, Ur: NEGATIVE

## 2018-05-23 SURGERY — LEFT HEART CATH AND CORONARY ANGIOGRAPHY
Anesthesia: LOCAL

## 2018-05-23 MED ORDER — LIDOCAINE HCL (PF) 1 % IJ SOLN
INTRAMUSCULAR | Status: DC | PRN
  Administered 2018-05-23: 3 mL via INTRADERMAL

## 2018-05-23 MED ORDER — HEPARIN (PORCINE) IN NACL 2-0.9 UNITS/ML
INTRAMUSCULAR | Status: DC | PRN
  Administered 2018-05-23 (×3): 500 mL via INTRA_ARTERIAL

## 2018-05-23 MED ORDER — SODIUM CHLORIDE 0.9 % IV SOLN: 250.0000 mL | INTRAVENOUS | Status: DC | PRN

## 2018-05-23 MED ORDER — CLOPIDOGREL BISULFATE 75 MG PO TABS
75.0000 mg | ORAL_TABLET | Freq: Every day | ORAL | Status: DC
  Administered 2018-05-24: 75 mg via ORAL
  Filled 2018-05-23: qty 1

## 2018-05-23 MED ORDER — HEPARIN SODIUM (PORCINE) 1000 UNIT/ML IJ SOLN
INTRAMUSCULAR | Status: DC | PRN
  Administered 2018-05-23: 4000 [IU] via INTRAVENOUS
  Administered 2018-05-23: 3000 [IU] via INTRAVENOUS

## 2018-05-23 MED ORDER — FAMOTIDINE IN NACL 20-0.9 MG/50ML-% IV SOLN
INTRAVENOUS | Status: AC | PRN
  Administered 2018-05-23: 20 mg via INTRAVENOUS

## 2018-05-23 MED ORDER — VERAPAMIL HCL 2.5 MG/ML IV SOLN
INTRAVENOUS | Status: AC
  Filled 2018-05-23: qty 2

## 2018-05-23 MED ORDER — LOSARTAN POTASSIUM 50 MG PO TABS
50.0000 mg | ORAL_TABLET | Freq: Every day | ORAL | Status: DC
  Administered 2018-05-24: 09:00:00 50 mg via ORAL
  Filled 2018-05-23: qty 1

## 2018-05-23 MED ORDER — SODIUM CHLORIDE 0.9% FLUSH: 3.0000 mL | Freq: Two times a day (BID) | INTRAVENOUS | Status: DC

## 2018-05-23 MED ORDER — NITROFURANTOIN MACROCRYSTAL 25 MG PO CAPS: 25.0000 mg | ORAL_CAPSULE | Freq: Every day | ORAL | Status: DC | PRN

## 2018-05-23 MED ORDER — LIDOCAINE HCL (PF) 1 % IJ SOLN
INTRAMUSCULAR | Status: AC
  Filled 2018-05-23: qty 30

## 2018-05-23 MED ORDER — NITROGLYCERIN 1 MG/10 ML FOR IR/CATH LAB
INTRA_ARTERIAL | Status: AC
  Filled 2018-05-23: qty 10

## 2018-05-23 MED ORDER — SODIUM CHLORIDE 0.9 % WEIGHT BASED INFUSION
3.0000 mL/kg/h | INTRAVENOUS | Status: DC
  Administered 2018-05-23: 3 mL/kg/h via INTRAVENOUS

## 2018-05-23 MED ORDER — HEPARIN (PORCINE) IN NACL 1000-0.9 UT/500ML-% IV SOLN
INTRAVENOUS | Status: AC
  Filled 2018-05-23: qty 1000

## 2018-05-23 MED ORDER — FENTANYL CITRATE (PF) 100 MCG/2ML IJ SOLN
INTRAMUSCULAR | Status: DC | PRN
  Administered 2018-05-23 (×2): 50 ug via INTRAVENOUS

## 2018-05-23 MED ORDER — SODIUM CHLORIDE 0.9 % WEIGHT BASED INFUSION: 1.0000 mL/kg/h | INTRAVENOUS | Status: DC

## 2018-05-23 MED ORDER — FENTANYL CITRATE (PF) 100 MCG/2ML IJ SOLN
INTRAMUSCULAR | Status: AC
  Filled 2018-05-23: qty 2

## 2018-05-23 MED ORDER — ALPRAZOLAM 0.25 MG PO TABS
0.2500 mg | ORAL_TABLET | Freq: Every day | ORAL | Status: DC | PRN
  Administered 2018-05-23: 22:00:00 0.25 mg via ORAL
  Filled 2018-05-23: qty 1

## 2018-05-23 MED ORDER — MIDAZOLAM HCL 2 MG/2ML IJ SOLN
INTRAMUSCULAR | Status: AC
  Filled 2018-05-23: qty 2

## 2018-05-23 MED ORDER — IOHEXOL 350 MG/ML SOLN
INTRAVENOUS | Status: DC | PRN
  Administered 2018-05-23: 160 mL via INTRA_ARTERIAL

## 2018-05-23 MED ORDER — CLOPIDOGREL BISULFATE 300 MG PO TABS
ORAL_TABLET | ORAL | Status: DC | PRN
  Administered 2018-05-23: 600 mg via ORAL

## 2018-05-23 MED ORDER — SODIUM CHLORIDE 0.9% FLUSH
3.0000 mL | Freq: Two times a day (BID) | INTRAVENOUS | Status: DC
  Administered 2018-05-24: 03:00:00 3 mL via INTRAVENOUS

## 2018-05-23 MED ORDER — CLOPIDOGREL BISULFATE 300 MG PO TABS
ORAL_TABLET | ORAL | Status: AC
  Filled 2018-05-23: qty 1

## 2018-05-23 MED ORDER — NITROGLYCERIN 1 MG/10 ML FOR IR/CATH LAB
INTRA_ARTERIAL | Status: DC | PRN
  Administered 2018-05-23: 200 ug via INTRACORONARY
  Administered 2018-05-23: 200 ug via INTRA_ARTERIAL
  Administered 2018-05-23: 200 ug via INTRACORONARY

## 2018-05-23 MED ORDER — VERAPAMIL HCL 2.5 MG/ML IV SOLN
INTRAVENOUS | Status: DC | PRN
  Administered 2018-05-23: 10 mL via INTRA_ARTERIAL

## 2018-05-23 MED ORDER — MIDAZOLAM HCL 2 MG/2ML IJ SOLN
INTRAMUSCULAR | Status: DC | PRN
  Administered 2018-05-23 (×3): 1 mg via INTRAVENOUS

## 2018-05-23 MED ORDER — ACETAMINOPHEN 325 MG PO TABS
650.0000 mg | ORAL_TABLET | ORAL | Status: DC | PRN
  Administered 2018-05-23: 17:00:00 650 mg via ORAL
  Filled 2018-05-23: qty 2

## 2018-05-23 MED ORDER — ONDANSETRON HCL 4 MG/2ML IJ SOLN: 4.0000 mg | Freq: Four times a day (QID) | INTRAMUSCULAR | Status: DC | PRN

## 2018-05-23 MED ORDER — ASPIRIN EC 81 MG PO TBEC
81.0000 mg | DELAYED_RELEASE_TABLET | Freq: Every day | ORAL | Status: DC
  Administered 2018-05-24: 81 mg via ORAL
  Filled 2018-05-23 (×3): qty 1

## 2018-05-23 MED ORDER — HEPARIN SODIUM (PORCINE) 1000 UNIT/ML IJ SOLN
INTRAMUSCULAR | Status: AC
  Filled 2018-05-23: qty 1

## 2018-05-23 MED ORDER — ROSUVASTATIN CALCIUM 5 MG PO TABS
5.0000 mg | ORAL_TABLET | Freq: Every day | ORAL | Status: DC
  Administered 2018-05-24: 5 mg via ORAL
  Filled 2018-05-23: qty 1

## 2018-05-23 MED ORDER — NITROGLYCERIN 0.4 MG SL SUBL
0.4000 mg | SUBLINGUAL_TABLET | SUBLINGUAL | Status: DC | PRN
Start: 2018-05-23 — End: 2018-05-24

## 2018-05-23 MED ORDER — ASPIRIN 81 MG PO CHEW: 81.0000 mg | CHEWABLE_TABLET | ORAL | Status: DC

## 2018-05-23 MED ORDER — VITAMIN D 1000 UNITS PO TABS
5000.0000 [IU] | ORAL_TABLET | Freq: Every day | ORAL | Status: DC
  Administered 2018-05-24: 09:00:00 5000 [IU] via ORAL
  Filled 2018-05-23: qty 5

## 2018-05-23 MED ORDER — VERAPAMIL HCL 2.5 MG/ML IV SOLN
INTRAVENOUS | Status: DC | PRN
  Administered 2018-05-23: 10 mL via INTRA_ARTERIAL
  Administered 2018-05-23: 15:00:00 via INTRA_ARTERIAL

## 2018-05-23 MED ORDER — SODIUM CHLORIDE 0.9% FLUSH: 3.0000 mL | INTRAVENOUS | Status: DC | PRN

## 2018-05-23 MED ORDER — SODIUM CHLORIDE 0.9 % WEIGHT BASED INFUSION: 1.0000 mL/kg/h | INTRAVENOUS | Status: AC

## 2018-05-23 MED ORDER — FAMOTIDINE IN NACL 20-0.9 MG/50ML-% IV SOLN
INTRAVENOUS | Status: AC
  Filled 2018-05-23: qty 50

## 2018-05-23 MED ORDER — ANGIOPLASTY BOOK
Freq: Once | Status: AC
  Administered 2018-05-23: 22:00:00
  Filled 2018-05-23: qty 1

## 2018-05-23 SURGICAL SUPPLY — 22 items
BALLN EMERGE MR 2.5X12 (BALLOONS) ×2
BALLN SAPPHIRE ~~LOC~~ 2.75X12 (BALLOONS) ×2 IMPLANT
BALLOON EMERGE MR 2.5X12 (BALLOONS) ×1 IMPLANT
CATH INFINITI 5 FR JL3.5 (CATHETERS) ×2 IMPLANT
CATH INFINITI 5FR MULTPACK ANG (CATHETERS) ×2 IMPLANT
CATH LAUNCHER 5F JL3 (CATHETERS) ×1 IMPLANT
CATH LAUNCHER 5F JL3.5 (CATHETERS) ×1 IMPLANT
CATHETER LAUNCHER 5F JL3 (CATHETERS) ×2
CATHETER LAUNCHER 5F JL3.5 (CATHETERS) ×2
DEVICE RAD TR BAND REGULAR (VASCULAR PRODUCTS) ×2 IMPLANT
ELECT DEFIB PAD ADLT CADENCE (PAD) ×2 IMPLANT
GLIDESHEATH SLEND SS 6F .021 (SHEATH) ×2 IMPLANT
GUIDEWIRE INQWIRE 1.5J.035X260 (WIRE) ×1 IMPLANT
INQWIRE 1.5J .035X260CM (WIRE) ×2
KIT ENCORE 26 ADVANTAGE (KITS) ×2 IMPLANT
KIT HEART LEFT (KITS) ×2 IMPLANT
PACK CARDIAC CATHETERIZATION (CUSTOM PROCEDURE TRAY) ×2 IMPLANT
STENT RESOLUTE ONYX 2.75X18 (Permanent Stent) ×2 IMPLANT
STENT RESOLUTE ONYX 2.75X8 (Permanent Stent) ×2 IMPLANT
TRANSDUCER W/STOPCOCK (MISCELLANEOUS) ×2 IMPLANT
TUBING CIL FLEX 10 FLL-RA (TUBING) ×2 IMPLANT
WIRE RUNTHROUGH .014X180CM (WIRE) ×2 IMPLANT

## 2018-05-23 NOTE — Interval H&P Note (Signed)
Cath Lab Visit (complete for each Cath Lab visit)  Clinical Evaluation Leading to the Procedure:   ACS: No.  Non-ACS:    Anginal Classification: CCS IV  Anti-ischemic medical therapy: Minimal Therapy (1 class of medications)  Non-Invasive Test Results: No non-invasive testing performed  Prior CABG: No previous CABG      History and Physical Interval Note:  05/23/2018 2:20 PM  Kim Villarreal  has presented today for surgery, with the diagnosis of unstable angina  The various methods of treatment have been discussed with the patient and family. After consideration of risks, benefits and other options for treatment, the patient has consented to  Procedure(s): LEFT HEART CATH AND CORONARY ANGIOGRAPHY (N/A) as a surgical intervention .  The patient's history has been reviewed, patient examined, no change in status, stable for surgery.  I have reviewed the patient's chart and labs.  Questions were answered to the patient's satisfaction.     [name]

## 2018-05-23 NOTE — Progress Notes (Signed)
Patient complained of pain from left shoulder down arm to cath site left radial. Area assessed and noted no bruising, hematoma, area soft with palpation. Pulses palpable, radial, ulnar and brachial. Instructed patient to call in any bruising, firm area (hematoma) or increased pain noted.

## 2018-05-24 DIAGNOSIS — I200 Unstable angina: Secondary | ICD-10-CM | POA: Diagnosis not present

## 2018-05-24 DIAGNOSIS — Z955 Presence of coronary angioplasty implant and graft: Secondary | ICD-10-CM | POA: Diagnosis not present

## 2018-05-24 DIAGNOSIS — I25110 Atherosclerotic heart disease of native coronary artery with unstable angina pectoris: Secondary | ICD-10-CM | POA: Diagnosis not present

## 2018-05-24 DIAGNOSIS — I10 Essential (primary) hypertension: Secondary | ICD-10-CM | POA: Diagnosis not present

## 2018-05-24 DIAGNOSIS — T82855A Stenosis of coronary artery stent, initial encounter: Secondary | ICD-10-CM | POA: Diagnosis not present

## 2018-05-24 LAB — BASIC METABOLIC PANEL
Anion gap: 8 (ref 5–15)
BUN: 12 mg/dL (ref 6–20)
CHLORIDE: 108 mmol/L (ref 98–111)
CO2: 24 mmol/L (ref 22–32)
Calcium: 9.1 mg/dL (ref 8.9–10.3)
Creatinine, Ser: 0.79 mg/dL (ref 0.44–1.00)
GFR calc non Af Amer: >60 mL/min (ref >60)
Glucose, Bld: 128 mg/dL — ABNORMAL HIGH (ref 70–99)
POTASSIUM: 4.5 mmol/L (ref 3.5–5.1)
SODIUM: 140 mmol/L (ref 135–145)

## 2018-05-24 LAB — CBC
HEMATOCRIT: 41.9 % (ref 36.0–46.0)
Hemoglobin: 13.4 g/dL (ref 12.0–15.0)
MCH: 28.3 pg (ref 26.0–34.0)
MCHC: 32 g/dL (ref 30.0–36.0)
MCV: 88.4 fL (ref 78.0–100.0)
Platelets: 164 10*3/uL (ref 150–400)
RBC: 4.74 MIL/uL (ref 3.87–5.11)
RDW: 14.2 % (ref 11.5–15.5)
WBC: 14.9 10*3/uL — AB (ref 4.0–10.5)

## 2018-05-24 MED ORDER — CLOPIDOGREL BISULFATE 75 MG PO TABS: 75.0000 mg | ORAL_TABLET | Freq: Every day | ORAL | 2 refills | Status: DC

## 2018-05-24 NOTE — Care Management Note (Signed)
Case Management Note  Patient Details  Name: DUNIA PRINGLE MRN: [id number] Date of Birth: 1971/02/26  Subjective/Objective:                    Action/Plan: Pt discharging home with self care.  PCP: Dr Baldemar Lenis Insurance: [employer] Pt has transportation home.  Expected Discharge Date:  05/24/18               Expected Discharge Plan:  Home/Self Care  In-House Referral:     Discharge planning Services     Post Acute Care Choice:    Choice offered to:     DME Arranged:    DME Agency:     HH Arranged:    HH Agency:     Status of Service:  Completed, signed off  If discussed at [employer] of Stay Meetings, dates discussed:    Additional Comments:  Pollie Friar, RN 05/24/2018, 10:21 AM

## 2018-05-24 NOTE — Progress Notes (Signed)
[facility] BAND REMOVAL  LOCATION:    left radial  DEFLATED PER PROTOCOL:    Yes.    TIME BAND OFF / DRESSING APPLIED: 2145  SITE UPON ARRIVAL:    Level 1  SITE AFTER BAND REMOVAL:    Level 0  CIRCULATION SENSATION AND MOVEMENT:    Within Normal Limits   Yes.    COMMENTS:  Pt.tolerated well

## 2018-05-24 NOTE — Discharge Summary (Signed)
Discharge Summary    Patient ID: Kim Villarreal,  MRN: [id number], DOB/AGE: 1971-09-06 47 y.o.  Admit date: 05/23/2018 Discharge date: 05/24/2018  Primary Care Provider: Derinda Villarreal Primary Cardiologist: Dr. Fletcher Villarreal   Discharge Diagnoses    Active Problems:   Unstable angina ([place])  Allergies Allergies  Allergen Reactions  . Codeine Nausea And Vomiting  . Contrast Media [Iodinated Diagnostic Agents] Swelling    The patient had mild lip swelling at the end of the case without rash or respiratory distress. This might be due to dye reaction. 2017    Diagnostic Studies/Procedures    Cath: 05/23/18  Conclusion     Prox LAD to Mid LAD lesion is 90% stenosed.  Post intervention, there is a 0% residual stenosis.  A drug-eluting stent was successfully placed using a [place] [id number].   1.  Severe one-vessel coronary artery disease due to in-stent restenosis in proximal to mid LAD.  No other obstructive disease.  The rest of the arteries look normal. 2.  Normal left ventricular end-diastolic pressure.  Left ventricular angiography was not performed due to severe radial artery spasm. 3.  Successful angioplasty and drug-eluting stent placement to the LAD for in-stent restenosis.  An additional overlapping stent was placed distally due to suspected edge dissection.  Recommendations:  Recommend uninterrupted dual antiplatelet therapy with Aspirin 81mg  daily and Clopidogrel 75mg  daily for a minimum of 6 months (stable ischemic heart disease - Class I recommendation).  However, longer duration is recommended given overlapped drug-eluting stent.  Aggressive treatment of risk factors.  Avoid catheterization via the radial artery in the future due to severe spasm.  _____________   History of Present Illness     47 y.o. female who presented for a follow-up visit on 05/22/18 regarding coronary artery disease with Dr. Fletcher Villarreal.  She is a previous smoker and quit about 10  years ago. She smoked for 15 years. She does have family history of coronary artery disease. She has no diabetes or hyperlipidemia.  Cardiac catheterization in November 2017 showed 95% stenosis in the mid LAD which was treated successfully with a drug-eluting stent placement. She had severe myalgia on atorvastatin.  She was tolerating small dose rosuvastatin. She was seen most recently by me in October.  At that time, she had intermittent exertional chest tightness.  Blood pressure was mildly elevated.  Dr.Arida added small dose Toprol but she did not take the medication given that her symptoms resolved at that time. Over the last 3 weeks prior to appt, she had experienced intermittent episodes of substernal chest tightness radiating to both arms mostly the right arm.  This happened initially after an intense [facility] but since then has been happening with less intense activities.  She was at the beach recently and she felt chest tightness with every exertion.  The symptoms seemed to be getting worse.  The symptoms are very similar to what she had before her PCI. Given her symptoms she was set up for outpatient cardiac cath.   Hospital Course     Underwent cardiac cath with Dr. Fletcher Villarreal noted above with successful PCI/DES x2 to the previously placed mLAD stent. Plan to continue DAPT with ASA/plavix for at least one year. Attempt was made using the radial artery, but developed severe spasm. Recommended to avoid radial site in the future. No complication noted post cath. She is already on statin therapy. Consider adding low dose BB as outpatient at follow up appt as she had  some bradycardia noted this admission on telemetry.   Kim Villarreal was seen by Dr. Gwenlyn Villarreal and determined stable for discharge home. Follow up in the office has been arranged. Medications are listed below.   _____________  Discharge Vitals Blood pressure (!) 141/45, pulse 64, temperature 98 F (36.7 C), temperature source Oral,  resp. rate (!) 21, height 5\' 6"  (1.676 m), weight 154 lb 15.7 oz (70.3 kg), last menstrual period 05/13/2018, SpO2 96 %.  Filed Weights   05/23/18 1229 05/23/18 1620  Weight: 135 lb (61.2 kg) 154 lb 15.7 oz (70.3 kg)    Labs & Radiologic Studies    CBC Recent Labs    05/22/18 1421 05/24/18 0330  WBC 7.0 14.9*  HGB 14.6 13.4  HCT 44.6 41.9  MCV 87 88.4  PLT 161 381   Basic Metabolic Panel Recent Labs    05/22/18 1421 05/24/18 0330  NA 142 140  K 4.4 4.5  CL 105 108  CO2 23 24  GLUCOSE 105* 128*  BUN 11 12  CREATININE 0.82 0.79  CALCIUM 9.6 9.1   Liver Function Tests No results for input(s): AST, ALT, ALKPHOS, BILITOT, PROT, ALBUMIN in the last 72 hours. No results for input(s): LIPASE, AMYLASE in the last 72 hours. Cardiac Enzymes No results for input(s): CKTOTAL, CKMB, CKMBINDEX, TROPONINI in the last 72 hours. BNP Invalid input(s): POCBNP D-Dimer No results for input(s): DDIMER in the last 72 hours. Hemoglobin A1C No results for input(s): HGBA1C in the last 72 hours. Fasting Lipid Panel No results for input(s): CHOL, HDL, LDLCALC, TRIG, CHOLHDL, LDLDIRECT in the last 72 hours. Thyroid Function Tests No results for input(s): TSH, T4TOTAL, T3FREE, THYROIDAB in the last 72 hours.  Invalid input(s): FREET3 _____________  No results Villarreal. Disposition   Pt is being discharged home today in good condition.  Follow-up Plans & Appointments    Follow-up Information    Kim Hampshire, MD Follow up.   Specialty:  Cardiology Why:  The office will call you with a follow up appt.  Contact information: [place] [id number] [phone]          Discharge Instructions    AMB Referral to Cardiac Rehabilitation - Phase II   Complete by:  As directed    Diagnosis:  Coronary Stents   Call MD for:  redness, tenderness, or signs of infection (pain, swelling, redness, odor or green/yellow discharge around incision site)    Complete by:  As directed    Diet - low sodium heart healthy   Complete by:  As directed    Discharge instructions   Complete by:  As directed    Radial Site Care Refer to this sheet in the next few weeks. These instructions provide you with information on caring for yourself after your procedure. Your caregiver may also give you more specific instructions. Your treatment has been planned according to current medical practices, but problems sometimes occur. Call your caregiver if you have any problems or questions after your procedure. HOME CARE INSTRUCTIONS You may shower the day after the procedure.Remove the bandage (dressing) and gently wash the site with plain soap and water.Gently pat the site dry.  Do not apply powder or lotion to the site.  Do not submerge the affected site in water for 3 to 5 days.  Inspect the site at least twice daily.  Do not flex or bend the affected arm for 24 hours.  No lifting over 5 pounds (2.3  kg) for 5 days after your procedure.  Do not drive home if you are discharged the same day of the procedure. Have someone else drive you.  You may drive 24 hours after the procedure unless otherwise instructed by your caregiver.  What to expect: Any bruising will usually fade within 1 to 2 weeks.  Blood that collects in the tissue (hematoma) may be painful to the touch. It should usually decrease in size and tenderness within 1 to 2 weeks.  SEEK IMMEDIATE MEDICAL CARE IF: You have unusual pain at the radial site.  You have redness, warmth, swelling, or pain at the radial site.  You have drainage (other than a small amount of blood on the dressing).  You have chills.  You have a fever or persistent symptoms for more than 72 hours.  You have a fever and your symptoms suddenly get worse.  Your arm becomes pale, cool, tingly, or numb.  You have heavy bleeding from the site. Hold pressure on the site.   PLEASE DO NOT MISS ANY DOSES OF YOUR PLAVIX!!!!! Also keep a  log of you blood pressures and bring back to your follow up appt. Please call the office with any questions.   Patients taking blood thinners should generally stay away from medicines like ibuprofen, Advil, Motrin, naproxen, and Aleve due to risk of stomach bleeding. You may take Tylenol as directed or talk to your primary doctor about alternatives.   Increase activity slowly   Complete by:  As directed       Discharge Medications     Medication List    STOP taking these medications   diphenhydrAMINE 50 MG tablet Commonly known as:  BENADRYL   predniSONE 50 MG tablet Commonly known as:  DELTASONE     TAKE these medications   ALPRAZolam 0.25 MG tablet Commonly known as:  XANAX Take 1 tablet (0.25 mg total) by mouth daily as needed for anxiety.   aspirin EC 81 MG tablet Take 1 tablet (81 mg total) by mouth daily.   clopidogrel 75 MG tablet Commonly known as:  PLAVIX Take 1 tablet (75 mg total) by mouth daily with breakfast. Start taking on:  05/25/2018   FISH OIL PO Take 1 capsule by mouth daily.   losartan 50 MG tablet Commonly known as:  COZAAR Take 1 tablet (50 mg total) by mouth daily.   MACRODANTIN 25 MG capsule Generic drug:  nitrofurantoin Take 25 mg by mouth daily as needed (for UTI prevention).   nitroGLYCERIN 0.4 MG SL tablet Commonly known as:  NITROSTAT Place 1 tablet (0.4 mg total) under the tongue every 5 (five) minutes as needed for chest pain.   PROBIOTIC PO Take 1 capsule by mouth daily.   rosuvastatin 5 MG tablet Commonly known as:  CRESTOR Take 1 tablet (5 mg total) by mouth daily.   Vitamin D3 5000 units Caps Take 5,000 Units by mouth daily.        Acute coronary syndrome (MI, NSTEMI, STEMI, etc) this admission?: No.   Outstanding Labs/Studies   N/a   Duration of Discharge Encounter   Greater than 30 minutes including physician time.  Signed, Reino Bellis NP-C 05/24/2018, 10:08 AM

## 2018-05-24 NOTE — Progress Notes (Signed)
Progress Note  Patient Name: Kim Villarreal Date of Encounter: 05/24/2018  Primary Cardiologist: Dr Fletcher Anon  Subjective   Doing well without symptoms postop day 1 LAD PCI and restenting for "in-stent restenosis via left radial approach.  Denies chest pain or shortness of breath.  Inpatient Medications    Scheduled Meds: . aspirin EC  81 mg Oral Daily  . cholecalciferol  5,000 Units Oral Daily  . clopidogrel  75 mg Oral Q breakfast  . losartan  50 mg Oral Daily  . rosuvastatin  5 mg Oral Daily  . sodium chloride flush  3 mL Intravenous Q12H   Continuous Infusions: . sodium chloride     PRN Meds: sodium chloride, acetaminophen, ALPRAZolam, nitroGLYCERIN, ondansetron (ZOFRAN) IV, sodium chloride flush   Vital Signs    Vitals:   05/23/18 1941 05/23/18 2000 05/24/18 0500 05/24/18 0736  BP: (!) 165/62 (!) 155/50 132/62 (!) 141/45  Pulse: 73 71 62 64  Resp: 20 (!) 22 17 (!) 21  Temp: 98.1 F (36.7 C)  98.7 F (37.1 C) 98 F (36.7 C)  TempSrc: Oral  Oral Oral  SpO2: 98% 98% 95% 96%  Weight:      Height:        Intake/Output Summary (Last 24 hours) at 05/24/2018 0924 Last data filed at 05/24/2018 0200 Gross per 24 hour  Intake 1017.6 ml  Output -  Net 1017.6 ml   Filed Weights   05/23/18 1229 05/23/18 1620  Weight: 135 lb (61.2 kg) 154 lb 15.7 oz (70.3 kg)    Telemetry    Sinus rhythm with short runs of nonsustained ventricular tachycardia- Personally Reviewed  ECG    Normal sinus rhythm at 60 without ST or T wave changes- Personally Reviewed  Physical Exam   GEN: No acute distress.   Neck: No JVD Cardiac: RRR, no murmurs, rubs, or gallops.  Respiratory: Clear to auscultation bilaterally. GI: Soft, nontender, non-distended  MS: No edema; No deformity. Neuro:  Nonfocal  Psych: Normal affect  Extremities: Left radial puncture site intact  Labs    Chemistry Recent Labs  Lab 05/22/18 1421 05/24/18 0330  NA 142 140  K 4.4 4.5  CL 105 108  CO2 23  24  GLUCOSE 105* 128*  BUN 11 12  CREATININE 0.82 0.79  CALCIUM 9.6 9.1  GFRNONAA 86 >60  GFRAA 99 >60  ANIONGAP  --  8     Hematology Recent Labs  Lab 05/22/18 1421 05/24/18 0330  WBC 7.0 14.9*  RBC 5.15 4.74  HGB 14.6 13.4  HCT 44.6 41.9  MCV 87 88.4  MCH 28.3 28.3  MCHC 32.7 32.0  RDW 13.9 14.2  PLT 161 164    Cardiac EnzymesNo results for input(s): TROPONINI in the last 168 hours. No results for input(s): TROPIPOC in the last 168 hours.   BNPNo results for input(s): BNP, PROBNP in the last 168 hours.   DDimer No results for input(s): DDIMER in the last 168 hours.   Radiology    No results found.  Cardiac Studies   Cardiac catheterization/PCI/drug-eluting stent (05/23/2018)  Conclusion     Prox LAD to Mid LAD lesion is 90% stenosed.  Post intervention, there is a 0% residual stenosis.  A drug-eluting stent was successfully placed using a [place] [id number].   1.  Severe one-vessel coronary artery disease due to in-stent restenosis in proximal to mid LAD.  No other obstructive disease.  The rest of the arteries look normal. 2.  Normal left ventricular end-diastolic pressure.  Left ventricular angiography was not performed due to severe radial artery spasm. 3.  Successful angioplasty and drug-eluting stent placement to the LAD for in-stent restenosis.  An additional overlapping stent was placed distally due to suspected edge dissection.  Recommendations:  Recommend uninterrupted dual antiplatelet therapy with Aspirin 81mg  daily and Clopidogrel 75mg  daily for a minimum of 6 months (stable ischemic heart disease - Class I recommendation).  However, longer duration is recommended given overlapped drug-eluting stent.  Aggressive treatment of risk factors.  Avoid catheterization via the radial artery in the future due to severe spasm.     Patient Profile     47 y.o. female history of LAD stenting November 2017 now with angiographic evidence of  aggressive in-stent restenosis by cath yesterday via left radial approach.  She underwent restenting using 2 overlapping drug-eluting stents with an excellent result.  There is no other CAD noted.  Further problems include essential hypertension and hyperlipidemia medically treated.  Assessment & Plan    1: Coronary artery disease- postop day 1 LAD restenting for in-stent restenosis of the previously placed mid LAD stent using overlapping drug-eluting stents.  She is on aspirin Plavix.  Dr. Fletcher Anon to decide on duration of antiplatelet therapy after 1 year.  2: Essential hypertension- controlled on current medications  3: Hyperlipidemia- on statin therapy  Stable for discharge this morning.  Follow-up with Dr. Fletcher Anon as an outpatient in 2 to 3 weeks.  For questions or updates, please contact [place] Please consult [email or web address] for contact info under Cardiology/STEMI.      Signed, Quay Burow, MD  05/24/2018, 9:24 AM

## 2018-05-25 ENCOUNTER — Encounter (HOSPITAL_COMMUNITY): Payer: Self-pay | Admitting: Cardiovascular Disease

## 2018-05-28 ENCOUNTER — Telehealth: Payer: Self-pay | Admitting: *Deleted

## 2018-05-28 NOTE — Telephone Encounter (Signed)
-----   Message from Blain Pais sent at 05/28/2018 11:20 AM EDT ----- Regarding: toc 7/23 8:30 Christell Faith, PA

## 2018-05-28 NOTE — Telephone Encounter (Signed)
Patient contacted regarding discharge from [place] on 05/24/18.   Patient understands to follow up with provider ? On 06/12/18 at [place] [facility].  Patient understands discharge instructions? Yes Patient understands medications and regiment? Yes  Patient understands to bring all medications to this visit? Yes   Patient did ask when she could begin working out in the gym. SHe has always been very active. Patient would like to know her parameters. Advised patient to avoid heavy lifting especially with her arm that used for catheterization and to not perform strenuous exercise until advised further at f/u appointment. Patient was agreeable and wanted to know if it was ok to do brisk walking. Routing to Dr Fletcher Anon for advice.

## 2018-05-28 NOTE — Telephone Encounter (Signed)
She can resume exercise but I want her to monitor her heart rate and should avoid getting her heart rate above 130 bpm initially for 2 weeks.  Then she can advance slowly after.

## 2018-05-29 NOTE — Telephone Encounter (Signed)
No answer. Left message to call back.   

## 2018-05-29 NOTE — Telephone Encounter (Signed)
Patient called back and verbalized understanding of Dr Tyrell Antonio recommendations. She did mention she felt arm where they went in for the cath seemed to "fall asleep" while she was lying flat on her back last night in bed. She denies swelling. She has some soreness when she flexes her bicep. She says there is some bruising but it is improving. She has good pink nail bed color as well. Advised patient to continue to monitor and let us know if the symptom worsens and she verbalized understanding.

## 2018-06-07 ENCOUNTER — Telehealth: Payer: Self-pay | Admitting: Physician Assistant

## 2018-06-07 NOTE — Telephone Encounter (Signed)
Spoke with patient and she reports intermittent itching to her hands and feet. She denies any rash, no swelling, and no other symptoms. The only thing new is the plavix so she wanted to check with [place]. Advised that I have never heard of this but that I would certainly route to provider for further review and any recommendations and that I would give her a call back. She was appreciative for the call and had no further questions at this time.

## 2018-06-07 NOTE — Progress Notes (Signed)
Cardiology Office Note Date:  06/12/2018  Patient ID:  Kim Villarreal, Kim Villarreal 1970-12-19, MRN [id number] PCP:  Derinda Late, MD  Cardiologist:  Dr. Fletcher Anon, MD    Chief Complaint: Follow up  History of Present Illness: Kim Villarreal is a 47 y.o. female with history of CAD s/p PCI/DES to the mid LAD in 2017 s/p PCI/DES to the LAD s/p recent PCI/DES to the LAD due to ISR as below in 05/2018, prior tobacco abuse quitting ~ 10 years prior, family history of CAD, HTN, mitral regurgitation, and migraine disorder who presents for follow up of recent diagnostic LHC.   Patient underwent Myoview in 07/2016 for chest pain that noted a small region of fixed perfusion defect in the mid to apical anteroseptal region consistent with breast attenuation artifact, overall low risk. She continued to note chest pain and underwent LHC in 09/2016 that showed 95% stenosis in the mid LAD which was successfully treated with PCI/DES. She was noted to have severe myalgias with Lipitor, though seems to be tolerating Crestor. She was seen in 08/2017, noting intermittent exertional chest tightness and was prescribed Toprol; however she did not take this given that her symptoms had resolved without intervention. She was seen on 05/22/2018 noting a 3 week history of intermittent episodes of chest tightness radiating to both arms, though mostly the right arm. Initially, this was noted after an intense boot camp, though began to occur with less intense activities as well. These symptoms were similar to her pain prior to her above PCI in 2017. She underwent diagnostic LHC on 05/23/2018 that showed severe one-vessel CAD due to ISR in the proximal to mid LAD. No other obstructive disease was noted with the rest of the coronary arteries looking normal. LVEDP was normal. LV gram was not performed due to severe radial artery spasm. She underwent successful PCI/DES to the LAD for ISR. An additional overlapping stent was placed distally due to  suspected edge dissection. Longer duration of DAPT was recommended due to overlapping DES.   Labs:  01/2018: LDL 66, LFT normal 05/2018: SCr 0.79, K+ 4.5, WBC 14.9, HGB 13.4, PLT 164  She comes in doing well today.  She has not had any further symptoms concerning for angina.  She has slowly began to resume her workout regimen and ran 1 mile on 7/19 without issues.  She is anxious to get back to the gym.  She has noted some positional dizziness since increasing losartan to 50 mg daily.  No recent falls.  Tolerating dual antiplatelet therapy with aspirin 81 mg daily and Plavix 75 mg daily.  She has briefly had intermittent pruritus of the palms and feet that she felt like may have been secondary to Plavix usage however, this has resolved.  No residual issues from radial cardiac cath site.  Following her procedure she did notice some palpitations with her Apple watch capturing intermittent PVCs.  Over the past several days these have resolved and she is asymptomatic.  Past Medical History:  Diagnosis Date  . Anemia    "as a child"  . Anxiety   . Bursitis   . Childhood asthma   . Chronic lower back pain   . Coronary artery disease    a. 09/2016: cath showing 95% stenosis of the mid-LAD. Xience 2.5x11mm DES placed, remaining left and right coronary tree angiographically normal, lip swelling noted at the end of the case without rash or respiratory distress, treated with IV Solu-Medrol and Benadryl.  Marland Kitchen History of  nuclear stress test    a. 07/2016: no sig ischemia, nl wall motion, EF 72%, small defect along mid to apical anteroseptal wall c/w breast attenuation artifact, low risk study  . Hypertension   . Migraine    "it was a bout; beginning of 2017; for ~ 1 month; went away" (05/23/2018)  . Mitral regurgitation    a. echo 08/2016: vigorous EF 65-70%, nl wall motion, nl LV diastolic fxn, mild MR  . Tendonitis     Past Surgical History:  Procedure Laterality Date  . CARDIAC CATHETERIZATION N/A  10/05/2016   Procedure: Left Heart Cath and Coronary Angiography;  Surgeon: Wellington Hampshire, MD;  Location: [facility];  Service: Cardiovascular;  Laterality: N/A;  . CARDIAC CATHETERIZATION N/A 10/05/2016   Procedure: Coronary Stent Intervention;  Surgeon: Wellington Hampshire, MD;  Location: [facility];  Service: Cardiovascular;  Laterality: N/A;  . CESAREAN SECTION  2003; 2005  . CORONARY ANGIOPLASTY WITH STENT PLACEMENT  05/23/2018   "2 stents"  . CORONARY STENT INTERVENTION N/A 05/23/2018   Procedure: CORONARY STENT INTERVENTION;  Surgeon: Wellington Hampshire, MD;  Location: [facility];  Service: Cardiovascular;  Laterality: N/A;  mid LAD  . ECTOPIC PREGNANCY SURGERY  1991  . LEFT HEART CATH AND CORONARY ANGIOGRAPHY N/A 05/23/2018   Procedure: LEFT HEART CATH AND CORONARY ANGIOGRAPHY;  Surgeon: Wellington Hampshire, MD;  Location: [facility];  Service: Cardiovascular;  Laterality: N/A;    Current Meds  Medication Sig  . ALPRAZolam (XANAX) 0.25 MG tablet Take 1 tablet (0.25 mg total) by mouth daily as needed for anxiety.  Marland Kitchen aspirin EC 81 MG tablet Take 1 tablet (81 mg total) by mouth daily.  . Cholecalciferol (VITAMIN D3) 5000 units CAPS Take 5,000 Units by mouth daily.   . clopidogrel (PLAVIX) 75 MG tablet Take 1 tablet (75 mg total) by mouth daily with breakfast.  . losartan (COZAAR) 50 MG tablet Take 1 tablet (50 mg total) by mouth daily.  . nitrofurantoin (MACRODANTIN) 25 MG capsule Take 25 mg by mouth daily as needed (for UTI prevention).   . nitroGLYCERIN (NITROSTAT) 0.4 MG SL tablet Place 1 tablet (0.4 mg total) under the tongue every 5 (five) minutes as needed for chest pain.  . Omega-3 Fatty Acids (FISH OIL PO) Take 1 capsule by mouth daily.   . Probiotic Product (PROBIOTIC PO) Take 1 capsule by mouth daily.  . rosuvastatin (CRESTOR) 5 MG tablet Take 1 tablet (5 mg total) by mouth daily.    Allergies:   Codeine and Contrast media [iodinated diagnostic  agents]   Social History:  The patient  reports that she quit smoking about 12 years ago. Her smoking use included cigarettes. She has a 11.25 pack-year smoking history. She has never used smokeless tobacco. She reports that she drinks alcohol. She reports that she does not use drugs.   Family History:  The patient's family history includes Heart disease in her mother.  ROS:   Review of Systems  Constitutional: Negative for chills, diaphoresis, fever, malaise/fatigue and weight loss.  HENT: Negative for congestion.   Eyes: Negative for discharge and redness.  Respiratory: Negative for cough, hemoptysis, sputum production, shortness of breath and wheezing.   Cardiovascular: Positive for palpitations. Negative for chest pain, orthopnea, claudication, leg swelling and PND.  Gastrointestinal: Negative for abdominal pain, blood in stool, heartburn, melena, nausea and vomiting.  Genitourinary: Negative for hematuria.  Musculoskeletal: Negative for falls and myalgias.  Skin:  Negative for rash.  Neurological: Negative for dizziness, tingling, tremors, sensory change, speech change, focal weakness, loss of consciousness and weakness.  Endo/Heme/Allergies: Bruises/bleeds easily.  Psychiatric/Behavioral: Negative for substance abuse. The patient is not nervous/anxious.   All other systems reviewed and are negative.    PHYSICAL EXAM:  VS:  BP 108/60 (BP Location: Left Arm, Patient Position: Sitting, Cuff Size: Normal)   Pulse 63   Ht 5' 6.5" (1.689 m)   Wt 136 lb (61.7 kg)   LMP 05/13/2018 (Exact Date)   BMI 21.62 kg/m  BMI: Body mass index is 21.62 kg/m.  Physical Exam  Constitutional: She is oriented to person, place, and time. She appears well-developed and well-nourished.  HENT:  Head: Normocephalic and atraumatic.  Eyes: Right eye exhibits no discharge. Left eye exhibits no discharge.  Neck: Normal range of motion. No JVD present.  Cardiovascular: Normal rate, regular rhythm, S1  normal and S2 normal. Exam reveals no distant heart sounds, no friction rub, no midsystolic click and no opening snap.  Murmur heard. High-pitched blowing holosystolic murmur is present with a grade of 1/6 at the apex. Pulses:      Dorsalis pedis pulses are 2+ on the right side, and 2+ on the left side.       Posterior tibial pulses are 2+ on the right side, and 2+ on the left side.  Radial cardiac catheterization site is well-healing without bleeding, bruising, swelling, erythema, warmth, or tenderness to palpation.  Radial pulse 2+.  Pulmonary/Chest: Effort normal and breath sounds normal. No respiratory distress. She has no decreased breath sounds. She has no wheezes. She has no rales. She exhibits no tenderness.  Abdominal: Soft. She exhibits no distension. There is no tenderness.  Musculoskeletal: She exhibits no edema.  Neurological: She is alert and oriented to person, place, and time.  Skin: Skin is warm and dry. No cyanosis. Nails show no clubbing.  Psychiatric: She has a normal mood and affect. Her speech is normal and behavior is normal. Judgment and thought content normal.     EKG:  Was ordered and interpreted by me today. Shows NSR, 63 bpm, possible prior septal infarct, poor R wave progression throughout the precordial leads, no acute ST-T changes  Recent Labs: 05/24/2018: BUN 12; Creatinine, Ser 0.79; Hemoglobin 13.4; Platelets 164; Potassium 4.5; Sodium 140  No results found for requested labs within last 8760 hours.   Estimated Creatinine Clearance: 83.9 mL/min (by C-G formula based on SCr of 0.79 mg/dL).   Wt Readings from Last 3 Encounters:  06/12/18 136 lb (61.7 kg)  05/23/18 154 lb 15.7 oz (70.3 kg)  05/22/18 136 lb 4 oz (61.8 kg)     Other studies reviewed: Additional studies/records reviewed today include: summarized above  ASSESSMENT AND PLAN:  1. CAD involving the native coronary arteries without angina: No symptoms concerning for angina.  Continue dual  antiplatelet therapy with aspirin 81 mg and Plavix 75 mg daily for at least 6 months without interruption, though would prefer longer duration given overlapping drug-eluting stents in the LAD as above.  She prefers to remain on Plavix at this time though if the pruritus of the palms and feet returns she will let us know for possible transitioning to alternative antiplatelet with loading dose.  Not on metoprolol secondary to concerns for fatigue.  Post-cath instructions.  No plans for further ischemic evaluation at this time.  2. Palpitations: Isolated PVCs were noted on rhythm strip obtained by her Apple watch.  These have since  resolved.  Offered Holter monitor, patient declined.  Could consider addition of metoprolol as above though she prefers to avoid this given her active lifestyle and concern for fatigue.  She will let us know if these return.  3. Hyperlipidemia: LDL of 66 from 01/2018 with normal liver function noted at that time.  Tolerating Crestor 5 mg daily without issues.  4. Hypertension: Blood pressure 108/60 today.  She has noted some positional dizziness.  Decrease losartan to 25 mg daily.  Should she notice her blood pressure trending up consistently greater than 130/80 she will let us know for titration of antihypertensive therapy.  5. Mitral regurgitation: Asymptomatic.  Continue to monitor clinically with periodic echocardiogram.  Disposition: F/u with Dr. Fletcher Anon or APP in 6 months.  Current medicines are reviewed at length with the patient today.  The patient did not have any concerns regarding medicines.  Signed, Christell Faith, PA-C 06/12/2018 8:40 AM     [address] [place], [place] [address] [phone]

## 2018-06-07 NOTE — Telephone Encounter (Signed)
Pt calling stating the past three days she's had really itchy hands and feet  She is not sure why, but thinks it may be the new blood thinner  She states there's no rash, swelling or redness  Would like a call back about this

## 2018-06-07 NOTE — Telephone Encounter (Signed)
Spoke with patient and reviewed recommendations by Dr. Fletcher Anon. She states that she has upcoming appointment with [place] next Tuesday and would like to wait until then. Reviewed medication option Effient 10 mg once daily with her so that she can look it up prior to her upcoming appointment. Also advised that if she would like me to send it in for her before then to just call me. She verbalized understanding of our conversation, agreement with plan, and had no further questions at this time.

## 2018-06-07 NOTE — Telephone Encounter (Signed)
Plavix can cause that.  If symptoms are not tolerable, we can switch her to Effient 10 mg daily.

## 2018-06-12 ENCOUNTER — Ambulatory Visit (INDEPENDENT_AMBULATORY_CARE_PROVIDER_SITE_OTHER): Admitting: Physician Assistant

## 2018-06-12 ENCOUNTER — Encounter: Payer: Self-pay | Admitting: Physician Assistant

## 2018-06-12 VITALS — BP 108/60 | HR 63 | Ht 66.5 in | Wt 136.0 lb

## 2018-06-12 DIAGNOSIS — I10 Essential (primary) hypertension: Secondary | ICD-10-CM

## 2018-06-12 DIAGNOSIS — I2510 Atherosclerotic heart disease of native coronary artery without angina pectoris: Secondary | ICD-10-CM | POA: Diagnosis not present

## 2018-06-12 DIAGNOSIS — I340 Nonrheumatic mitral (valve) insufficiency: Secondary | ICD-10-CM

## 2018-06-12 DIAGNOSIS — E785 Hyperlipidemia, unspecified: Secondary | ICD-10-CM

## 2018-06-12 DIAGNOSIS — R002 Palpitations: Secondary | ICD-10-CM

## 2018-06-12 MED ORDER — LOSARTAN POTASSIUM 25 MG PO TABS: 25.0000 mg | ORAL_TABLET | Freq: Every day | ORAL | 3 refills | Status: DC

## 2018-06-12 NOTE — Patient Instructions (Signed)
Medication Instructions:  Your physician has recommended you make the following change in your medication:  1- DECREASE Losartan to 25 mg by mouth once a day.   Labwork: none  Testing/Procedures: none  Follow-Up: Your physician wants you to follow-up in: [place]. You will receive a reminder letter in the mail two months in advance. If you don't receive a letter, please call our office to schedule the follow-up appointment.  If you need a refill on your cardiac medications before your next appointment, please call your pharmacy.

## 2018-06-28 ENCOUNTER — Ambulatory Visit: Admitting: Cardiovascular Disease

## 2018-08-13 ENCOUNTER — Telehealth: Payer: Self-pay | Admitting: Cardiovascular Disease

## 2018-08-13 DIAGNOSIS — R079 Chest pain, unspecified: Secondary | ICD-10-CM

## 2018-08-13 NOTE — Telephone Encounter (Signed)
Pt c/o of Chest Pain: STAT if CP now or developed within 24 hours  1. Are you having CP right now? It's more of a chest tightness - exercise induced only at this time  2. Are you experiencing any other symptoms (ex. SOB, nausea, vomiting, sweating)? Nausea, chest tightness runs up one arm and on to the other   3. How long have you been experiencing CP? 2 weeks  4. Is your CP continuous or coming and going? Currently only exercise induced, they are all the same symptoms she had prior to stents   5. Have you taken Nitroglycerin? no

## 2018-08-14 ENCOUNTER — Encounter: Payer: Self-pay | Admitting: *Deleted

## 2018-08-14 NOTE — Telephone Encounter (Signed)
Patient has been called and verbalized her agreement to have the treadmill Myoview completed. Orders has been placed and message sent to scheduling.   She has educated on the instructions for the test. A letter of instructions has been sent to [place].

## 2018-08-14 NOTE — Telephone Encounter (Signed)
Patient returning call.

## 2018-08-14 NOTE — Telephone Encounter (Signed)
Schedule a treadmill myoview.

## 2018-08-14 NOTE — Telephone Encounter (Signed)
Left a message for the patient to call back.  

## 2018-08-14 NOTE — Telephone Encounter (Signed)
Returned the call to the patient. She stated that for the last two weeks she has been having chest tightness on exertion. She stated that this does not occur when she does her daily activities but only when she exerts herself such as when she walks on the treadmill. She describes this as a tightness and not a pain that extends from her chest to her arms and chin. It resolves on rest.  She had stents placed in 05/2018 and 2017. She stated that this is the same feeling she had prior to those two procedures. She will go to the ED if the pain does not subside. She has not taken nitroglycerin for this and she is currently compliant with her 81 mg aspirin and 75 mg Plavix.

## 2018-08-15 NOTE — Telephone Encounter (Signed)
Patient declined to schedule stress test at this time  Until she speaks with nurse about worsening symptoms.   Patient says she is feeling tightness all the time and now dizzy with sob  Please call to discuss

## 2018-08-15 NOTE — Telephone Encounter (Signed)
Call placed to the patient. She stated that she does not feel like she can do the treadmill at this time due to the increased chest tightness and shortness of breath. She describes the shortness of breath as more of a labored breathing.   She would like to speak with Dr. Fletcher Anon about these symptoms. An appointment has been made for her on Friday 9/27, the soonest available. She stated that she does not feel like she needs to go to the ED for further assessment at this point.

## 2018-08-16 NOTE — Telephone Encounter (Signed)
Patient has been made aware and will come to her appointment tomorrow with Dr. Fletcher Anon.

## 2018-08-16 NOTE — Telephone Encounter (Signed)
Hold off on stress test for now.  I will see her tomorrow and decide if we need to proceed with catheterization.

## 2018-08-17 ENCOUNTER — Ambulatory Visit (INDEPENDENT_AMBULATORY_CARE_PROVIDER_SITE_OTHER): Admitting: Cardiovascular Disease

## 2018-08-17 ENCOUNTER — Encounter: Payer: Self-pay | Admitting: Cardiovascular Disease

## 2018-08-17 VITALS — BP 132/64 | HR 56 | Ht 66.5 in | Wt 139.8 lb

## 2018-08-17 DIAGNOSIS — I200 Unstable angina: Secondary | ICD-10-CM | POA: Diagnosis not present

## 2018-08-17 DIAGNOSIS — Z01818 Encounter for other preprocedural examination: Secondary | ICD-10-CM | POA: Diagnosis not present

## 2018-08-17 DIAGNOSIS — I25110 Atherosclerotic heart disease of native coronary artery with unstable angina pectoris: Secondary | ICD-10-CM | POA: Diagnosis not present

## 2018-08-17 DIAGNOSIS — E785 Hyperlipidemia, unspecified: Secondary | ICD-10-CM | POA: Diagnosis not present

## 2018-08-17 MED ORDER — PREDNISONE 50 MG PO TABS: ORAL_TABLET | ORAL | 0 refills | Status: DC

## 2018-08-17 MED ORDER — DIPHENHYDRAMINE HCL 50 MG PO TABS: ORAL_TABLET | ORAL | 0 refills | Status: DC

## 2018-08-17 NOTE — Progress Notes (Signed)
Cardiology Office Note   Date:  08/17/2018   ID:  Kim Villarreal, DOB 10-Aug-1971, MRN [id number]  PCP:  Derinda Late, MD  Cardiologist:   [name], MD   Chief Complaint  Patient presents with  . other    Chest tightness, and SOB,fatigue. Medications reviewed verbally.      History of Present Illness: Kim Villarreal is a 47 y.o. female who presents for a follow-up visit regarding coronary artery disease.  She is a previous smoker and quit about 10 years ago. She smoked for 15 years. She does have family history of coronary artery disease. She has no diabetes or hyperlipidemia.  Cardiac catheterization in November 2017 showed 95% stenosis in the mid LAD which was treated successfully with a drug-eluting stent placement. She had severe myalgia on atorvastatin.  She is tolerating small dose rosuvastatin. She had recurrent angina few months ago and underwent repeat cardiac catheterization in July which showed severe in-stent restenosis in the LAD.  This was treated with PCI and another drug-eluting stent placement with resolute Onyx.  A distal overlapped stent was placed due to suspected edge dissection.  We achieved excellent results.  She now returns with recurrent angina that started about 3 weeks ago.  She is having the same left arm discomfort with chest tightness which initially started while she is was working out on the treadmill.  However, now it is happening when she does her everyday activities.  She has no rest pain.  She takes her medications regularly.   Past Medical History:  Diagnosis Date  . Anemia    "as a child"  . Anxiety   . Bursitis   . Childhood asthma   . Chronic lower back pain   . Coronary artery disease    a. 09/2016: cath showing 95% stenosis of the mid-LAD. Xience 2.5x42mm DES placed, remaining left and right coronary tree angiographically normal, lip swelling noted at the end of the case without rash or respiratory distress, treated with IV  Solu-Medrol and Benadryl.  Marland Kitchen History of nuclear stress test    a. 07/2016: no sig ischemia, nl wall motion, EF 72%, small defect along mid to apical anteroseptal wall c/w breast attenuation artifact, low risk study  . Hypertension   . Migraine    "it was a bout; beginning of 2017; for ~ 1 month; went away" (05/23/2018)  . Mitral regurgitation    a. echo 08/2016: vigorous EF 65-70%, nl wall motion, nl LV diastolic fxn, mild MR  . Tendonitis     Past Surgical History:  Procedure Laterality Date  . CARDIAC CATHETERIZATION N/A 10/05/2016   Procedure: Left Heart Cath and Coronary Angiography;  Surgeon: Wellington Hampshire, MD;  Location: [facility];  Service: Cardiovascular;  Laterality: N/A;  . CARDIAC CATHETERIZATION N/A 10/05/2016   Procedure: Coronary Stent Intervention;  Surgeon: Wellington Hampshire, MD;  Location: [facility];  Service: Cardiovascular;  Laterality: N/A;  . CESAREAN SECTION  2003; 2005  . CORONARY ANGIOPLASTY WITH STENT PLACEMENT  05/23/2018   "2 stents"  . CORONARY STENT INTERVENTION N/A 05/23/2018   Procedure: CORONARY STENT INTERVENTION;  Surgeon: Wellington Hampshire, MD;  Location: [facility];  Service: Cardiovascular;  Laterality: N/A;  mid LAD  . ECTOPIC PREGNANCY SURGERY  1991  . LEFT HEART CATH AND CORONARY ANGIOGRAPHY N/A 05/23/2018   Procedure: LEFT HEART CATH AND CORONARY ANGIOGRAPHY;  Surgeon: Wellington Hampshire, MD;  Location: [facility];  Service: Cardiovascular;  Laterality: N/A;     Current Outpatient Medications  Medication Sig Dispense Refill  . ALPRAZolam (XANAX) 0.25 MG tablet Take 1 tablet (0.25 mg total) by mouth daily as needed for anxiety. 15 tablet 0  . aspirin EC 81 MG tablet Take 1 tablet (81 mg total) by mouth daily. 90 tablet 3  . Cholecalciferol (VITAMIN D3) 5000 units CAPS Take 5,000 Units by mouth daily.     . clopidogrel (PLAVIX) 75 MG tablet Take 1 tablet (75 mg total) by mouth daily with breakfast. 90 tablet 2  .  losartan (COZAAR) 50 MG tablet     . nitrofurantoin (MACRODANTIN) 25 MG capsule Take 25 mg by mouth daily as needed (for UTI prevention).     . nitroGLYCERIN (NITROSTAT) 0.4 MG SL tablet Place 1 tablet (0.4 mg total) under the tongue every 5 (five) minutes as needed for chest pain. 25 tablet 2  . Omega-3 Fatty Acids (FISH OIL PO) Take 1 capsule by mouth daily.     . Probiotic Product (PROBIOTIC PO) Take 1 capsule by mouth daily.    . rosuvastatin (CRESTOR) 5 MG tablet Take 1 tablet (5 mg total) by mouth daily. 90 tablet 3   No current facility-administered medications for this visit.     Allergies:   Codeine and Contrast media [iodinated diagnostic agents]    Social History:  The patient  reports that she quit smoking about 12 years ago. Her smoking use included cigarettes. She has a 11.25 pack-year smoking history. She has never used smokeless tobacco. She reports that she drinks alcohol. She reports that she does not use drugs.   Family History:  The patient's family history includes Heart disease in her mother.    ROS:  Please see the history of present illness.   Otherwise, review of systems are positive for none.   All other systems are reviewed and negative.    PHYSICAL EXAM: VS:  BP 132/64 (BP Location: Left Arm, Patient Position: Sitting, Cuff Size: Normal)   Pulse (!) 56   Ht 5' 6.5" (1.689 m)   Wt 139 lb 12 oz (63.4 kg)   BMI 22.22 kg/m  , BMI Body mass index is 22.22 kg/m. GEN: Well nourished, well developed, in no acute distress  HEENT: normal  Neck: no JVD, carotid bruits, or masses Cardiac: RRR; no  rubs, or gallops,no edema . 2/6 systolic ejection murmur in the aortic area. Respiratory:  clear to auscultation bilaterally, normal work of breathing GI: soft, nontender, nondistended, + BS MS: no deformity or atrophy  Skin: warm and dry, no rash Neuro:  Strength and sensation are intact Psych: euthymic mood, full affect Femoral pulses diminished bilaterally.  EKG:   EKG is ordered today. The ekg ordered today demonstrates sinus bradycardia with possible left atrial enlargement.  Old septal infarct.  No new changes.  Recent Labs: 05/24/2018: BUN 12; Creatinine, Ser 0.79; Hemoglobin 13.4; Platelets 164; Potassium 4.5; Sodium 140    Lipid Panel    Component Value Date/Time   CHOL 135 03/09/2017 0855   TRIG 56 03/09/2017 0855   HDL 49 03/09/2017 0855   CHOLHDL 2.8 03/09/2017 0855   VLDL 11 03/09/2017 0855   LDLCALC 75 03/09/2017 0855      Wt Readings from Last 3 Encounters:  08/17/18 139 lb 12 oz (63.4 kg)  06/12/18 136 lb (61.7 kg)  05/23/18 154 lb 15.7 oz (70.3 kg)      PAD Screen 08/05/2016  Previous PAD dx? No  Previous surgical procedure? No  Pain with walking? No  Feet/toe relief with dangling? No  Painful, non-healing ulcers? No  Extremities discolored? No      ASSESSMENT AND PLAN:  1.  Coronary artery disease involving native coronary arteries with unstable angina: Unfortunately, the patient's symptoms are suggestive of recurrent angina after repeated LAD PCI.  Her symptoms are now happening with minimal activities in spite of optimal medical therapy.  Due to this, I have recommended proceeding with repeat left heart catheterization and possible PCI.   The plan is to do it via the femoral approach given known history of severe radial artery spasm and also her radial pulses are diminished bilaterally. If she has in-stent restenosis in the LAD, we have to strongly consider off-pump LIMA to LAD.  2. Hyperlipidemia: She is tolerating small dose rosuvastatin.  I reviewed most recent lipid profile done in March which showed an LDL of 66.  3. Essential hypertension: Blood pressure is reasonably controlled.   Disposition:   FU with me in 1 month.  Signed,  [name], MD  08/17/2018 8:53 AM    [place]

## 2018-08-17 NOTE — H&P (View-Only) (Signed)
Cardiology Office Note   Date:  08/17/2018   ID:  [name], DOB [birth year], MRN [id number]  PCP:  Derinda Late, MD  Cardiologist:   [name], MD   Chief Complaint  Patient presents with  . other    Chest tightness, and SOB,fatigue. Medications reviewed verbally.      History of Present Illness: Kim Villarreal is a 47 y.o. female who presents for a follow-up visit regarding coronary artery disease.  She is a previous smoker and quit about 10 years ago. She smoked for 15 years. She does have family history of coronary artery disease. She has no diabetes or hyperlipidemia.  Cardiac catheterization in November 2017 showed 95% stenosis in the mid LAD which was treated successfully with a drug-eluting stent placement. She had severe myalgia on atorvastatin.  She is tolerating small dose rosuvastatin. She had recurrent angina few months ago and underwent repeat cardiac catheterization in July which showed severe in-stent restenosis in the LAD.  This was treated with PCI and another drug-eluting stent placement with resolute Onyx.  A distal overlapped stent was placed due to suspected edge dissection.  We achieved excellent results.  She now returns with recurrent angina that started about 3 weeks ago.  She is having the same left arm discomfort with chest tightness which initially started while she is was working out on the treadmill.  However, now it is happening when she does her everyday activities.  She has no rest pain.  She takes her medications regularly.   Past Medical History:  Diagnosis Date  . Anemia    "as a child"  . Anxiety   . Bursitis   . Childhood asthma   . Chronic lower back pain   . Coronary artery disease    a. 09/2016: cath showing 95% stenosis of the mid-LAD. Xience 2.5x67mm DES placed, remaining left and right coronary tree angiographically normal, lip swelling noted at the end of the case without rash or respiratory distress, treated with IV  Solu-Medrol and Benadryl.  Marland Kitchen History of nuclear stress test    a. 07/2016: no sig ischemia, nl wall motion, EF 72%, small defect along mid to apical anteroseptal wall c/w breast attenuation artifact, low risk study  . Hypertension   . Migraine    "it was a bout; beginning of 2017; for ~ 1 month; went away" (05/23/2018)  . Mitral regurgitation    a. echo 08/2016: vigorous EF 65-70%, nl wall motion, nl LV diastolic fxn, mild MR  . Tendonitis     Past Surgical History:  Procedure Laterality Date  . CARDIAC CATHETERIZATION N/A 10/05/2016   Procedure: Left Heart Cath and Coronary Angiography;  Surgeon: Wellington Hampshire, MD;  Location: [facility];  Service: Cardiovascular;  Laterality: N/A;  . CARDIAC CATHETERIZATION N/A 10/05/2016   Procedure: Coronary Stent Intervention;  Surgeon: Wellington Hampshire, MD;  Location: [place] [facility];  Service: Cardiovascular;  Laterality: N/A;  . CESAREAN SECTION  2003; 2005  . CORONARY ANGIOPLASTY WITH STENT PLACEMENT  05/23/2018   "2 stents"  . CORONARY STENT INTERVENTION N/A 05/23/2018   Procedure: CORONARY STENT INTERVENTION;  Surgeon: Wellington Hampshire, MD;  Location: [place] [facility];  Service: Cardiovascular;  Laterality: N/A;  mid LAD  . ECTOPIC PREGNANCY SURGERY  1991  . LEFT HEART CATH AND CORONARY ANGIOGRAPHY N/A 05/23/2018   Procedure: LEFT HEART CATH AND CORONARY ANGIOGRAPHY;  Surgeon: Wellington Hampshire, MD;  Location: [place] [place] [facility];  Service: Cardiovascular;  Laterality: N/A;     Current Outpatient Medications  Medication Sig Dispense Refill  . ALPRAZolam (XANAX) 0.25 MG tablet Take 1 tablet (0.25 mg total) by mouth daily as needed for anxiety. 15 tablet 0  . aspirin EC 81 MG tablet Take 1 tablet (81 mg total) by mouth daily. 90 tablet 3  . Cholecalciferol (VITAMIN D3) 5000 units CAPS Take 5,000 Units by mouth daily.     . clopidogrel (PLAVIX) 75 MG tablet Take 1 tablet (75 mg total) by mouth daily with breakfast. 90 tablet 2  .  losartan (COZAAR) 50 MG tablet     . nitrofurantoin (MACRODANTIN) 25 MG capsule Take 25 mg by mouth daily as needed (for UTI prevention).     . nitroGLYCERIN (NITROSTAT) 0.4 MG SL tablet Place 1 tablet (0.4 mg total) under the tongue every 5 (five) minutes as needed for chest pain. 25 tablet 2  . Omega-3 Fatty Acids (FISH OIL PO) Take 1 capsule by mouth daily.     . Probiotic Product (PROBIOTIC PO) Take 1 capsule by mouth daily.    . rosuvastatin (CRESTOR) 5 MG tablet Take 1 tablet (5 mg total) by mouth daily. 90 tablet 3   No current facility-administered medications for this visit.     Allergies:   Codeine and Contrast media [iodinated diagnostic agents]    Social History:  The patient  reports that she quit smoking about 12 years ago. Her smoking use included cigarettes. She has a 11.25 pack-year smoking history. She has never used smokeless tobacco. She reports that she drinks alcohol. She reports that she does not use drugs.   Family History:  The patient's family history includes Heart disease in her mother.    ROS:  Please see the history of present illness.   Otherwise, review of systems are positive for none.   All other systems are reviewed and negative.    PHYSICAL EXAM: VS:  BP 132/64 (BP Location: Left Arm, Patient Position: Sitting, Cuff Size: Normal)   Pulse (!) 56   Ht 5' 6.5" (1.689 m)   Wt 139 lb 12 oz (63.4 kg)   BMI 22.22 kg/m  , BMI Body mass index is 22.22 kg/m. GEN: Well nourished, well developed, in no acute distress  HEENT: normal  Neck: no JVD, carotid bruits, or masses Cardiac: RRR; no  rubs, or gallops,no edema . 2/6 systolic ejection murmur in the aortic area. Respiratory:  clear to auscultation bilaterally, normal work of breathing GI: soft, nontender, nondistended, + BS MS: no deformity or atrophy  Skin: warm and dry, no rash Neuro:  Strength and sensation are intact Psych: euthymic mood, full affect Femoral pulses diminished bilaterally.  EKG:   EKG is ordered today. The ekg ordered today demonstrates sinus bradycardia with possible left atrial enlargement.  Old septal infarct.  No new changes.  Recent Labs: 05/24/2018: BUN 12; Creatinine, Ser 0.79; Hemoglobin 13.4; Platelets 164; Potassium 4.5; Sodium 140    Lipid Panel    Component Value Date/Time   CHOL 135 03/09/2017 0855   TRIG 56 03/09/2017 0855   HDL 49 03/09/2017 0855   CHOLHDL 2.8 03/09/2017 0855   VLDL 11 03/09/2017 0855   LDLCALC 75 03/09/2017 0855      Wt Readings from Last 3 Encounters:  08/17/18 139 lb 12 oz (63.4 kg)  06/12/18 136 lb (61.7 kg)  05/23/18 154 lb 15.7 oz (70.3 kg)      PAD Screen 08/05/2016  Previous PAD dx? No  Previous surgical procedure? No  Pain with walking? No  Feet/toe relief with dangling? No  Painful, non-healing ulcers? No  Extremities discolored? No      ASSESSMENT AND PLAN:  1.  Coronary artery disease involving native coronary arteries with unstable angina: Unfortunately, the patient's symptoms are suggestive of recurrent angina after repeated LAD PCI.  Her symptoms are now happening with minimal activities in spite of optimal medical therapy.  Due to this, I have recommended proceeding with repeat left heart catheterization and possible PCI.   The plan is to do it via the femoral approach given known history of severe radial artery spasm and also her radial pulses are diminished bilaterally. If she has in-stent restenosis in the LAD, we have to strongly consider off-pump LIMA to LAD.  2. Hyperlipidemia: She is tolerating small dose rosuvastatin.  I reviewed most recent lipid profile done in March which showed an LDL of 66.  3. Essential hypertension: Blood pressure is reasonably controlled.   Disposition:   FU with me in 1 month.  Signed,  [name], MD  08/17/2018 8:53 AM    [place]

## 2018-08-17 NOTE — Patient Instructions (Signed)
Medication Instructions: Your physician recommends that you continue on your current medications as directed. Please refer to the Current Medication list given to you today.  If you need a refill on your cardiac medications before your next appointment, please call your pharmacy.   Follow-Up: Your physician wants you to follow-up in one month with Dr. Fletcher Anon    Thank you for choosing [facility] at [facility]!      [address] [place], [place] [address] Dept: [phone] Loc: [phone]  AMANDINE COVINO  08/17/2018  You are scheduled for a Cardiac Catheterization on Wednesday, October 2 with Dr. [name].  1. Please arrive at the [facility] (Main Entrance A) at [facility]: [address] [place], [place] [address] at 10:30 AM (This time is two hours before your procedure to ensure your preparation). Free valet parking service is available.   Special note: Every effort is made to have your procedure done on time. Please understand that emergencies sometimes delay scheduled procedures.  2. Diet: Do not eat solid foods after midnight.  The patient may have clear liquids until 5am upon the day of the procedure.  3. Labs: Done today 08/17/18  4. Medication instructions in preparation for your procedure:  13 hours prior to your procedure time take one Prednisone 50 mg tablet, then 7 hours prior to your procedure time take one Prednisone 50 mg tablet, and then finally 1-2 hours prior to procedure time (just before you leave for the hospital) take one Prednisone 50 mg tablet and one Benadryl 50 mg tablet. Please call the office if you have any questions.    On the morning of your procedure, take your Aspirin and Plavix/Clopidogrel and any morning medicines NOT listed above.  You may use sips of water.  5. Plan for one night stay--bring personal belongings. 6. Bring a  current list of your medications and current insurance cards. 7. You MUST have a responsible person to drive you home. 8. Someone MUST be with you the first 24 hours after you arrive home or your discharge will be delayed. 9. Please wear clothes that are easy to get on and off and wear slip-on shoes.  Thank you for allowing [place] to care for you!   -- [facility]

## 2018-08-18 LAB — CBC
HEMATOCRIT: 44.3 % (ref 34.0–46.6)
HEMOGLOBIN: 14.2 g/dL (ref 11.1–15.9)
MCH: 27.5 pg (ref 26.6–33.0)
MCHC: 32.1 g/dL (ref 31.5–35.7)
MCV: 86 fL (ref 79–97)
Platelets: 149 10*3/uL — ABNORMAL LOW (ref 150–450)
RBC: 5.16 x10E6/uL (ref 3.77–5.28)
RDW: 13.6 % (ref 12.3–15.4)
WBC: 4.9 10*3/uL (ref 3.4–10.8)

## 2018-08-18 LAB — BASIC METABOLIC PANEL
BUN/Creatinine Ratio: 17 (ref 9–23)
BUN: 16 mg/dL (ref 6–24)
CALCIUM: 9.8 mg/dL (ref 8.7–10.2)
CO2: 23 mmol/L (ref 20–29)
CREATININE: 0.92 mg/dL (ref 0.57–1.00)
Chloride: 105 mmol/L (ref 96–106)
GFR calc Af Amer: 86 mL/min/{1.73_m2} (ref >59)
GFR, EST NON AFRICAN AMERICAN: 75 mL/min/{1.73_m2} (ref >59)
Glucose: 100 mg/dL — ABNORMAL HIGH (ref 65–99)
POTASSIUM: 4 mmol/L (ref 3.5–5.2)
Sodium: 143 mmol/L (ref 134–144)

## 2018-08-20 ENCOUNTER — Telehealth: Payer: Self-pay | Admitting: *Deleted

## 2018-08-20 NOTE — Telephone Encounter (Signed)
Pt contacted pre-catheterization scheduled at [facility] for: Wednesday August 22, 2018 12:30 PM Verified arrival time and place: [place] Entrance A at: 10:30 AM  No solid food after midnight prior to cath, clear liquids until 5 AM day of procedure. Verified contrast allergy-reviewed instructions for 13 hour Prednisone and Benadryl Prep-Prednisone 50 mg 08/21/18 11:30 PM, Prednisone 50 mg 08/22/18 5:30 AM, Prednisone 50 mg and Benadryl 50 mg just prior to leaving for hospital day of procedure.   AM meds can be  taken pre-cath with sip of water including: ASA 81 mg Plavix 75 mg Prednisone 50 mg Benadryl 50 mg  Confirmed patient has responsible person to drive home post procedure and for 24 hours after you arrive home: yes

## 2018-08-22 ENCOUNTER — Other Ambulatory Visit: Payer: Self-pay | Admitting: *Deleted

## 2018-08-22 ENCOUNTER — Other Ambulatory Visit: Payer: Self-pay

## 2018-08-22 ENCOUNTER — Inpatient Hospital Stay (HOSPITAL_COMMUNITY)
Admission: AD | Admit: 2018-08-22 | Discharge: 2018-08-31 | DRG: 234 | Disposition: A | Discharge status: Home / Self Care | Source: Ambulatory Visit | Attending: Surgery | Admitting: Surgery

## 2018-08-22 ENCOUNTER — Encounter (HOSPITAL_COMMUNITY)
Admission: AD | Disposition: A | Discharge status: Home / Self Care | Payer: Self-pay | Source: Ambulatory Visit | Attending: Cardiovascular Disease

## 2018-08-22 DIAGNOSIS — I25110 Atherosclerotic heart disease of native coronary artery with unstable angina pectoris: Secondary | ICD-10-CM | POA: Diagnosis present

## 2018-08-22 DIAGNOSIS — R001 Bradycardia, unspecified: Secondary | ICD-10-CM | POA: Diagnosis not present

## 2018-08-22 DIAGNOSIS — Z91041 Radiographic dye allergy status: Secondary | ICD-10-CM | POA: Diagnosis not present

## 2018-08-22 DIAGNOSIS — Z87891 Personal history of nicotine dependence: Secondary | ICD-10-CM | POA: Diagnosis not present

## 2018-08-22 DIAGNOSIS — E8770 Fluid overload, unspecified: Secondary | ICD-10-CM | POA: Diagnosis not present

## 2018-08-22 DIAGNOSIS — Z8249 Family history of ischemic heart disease and other diseases of the circulatory system: Secondary | ICD-10-CM | POA: Diagnosis not present

## 2018-08-22 DIAGNOSIS — I10 Essential (primary) hypertension: Secondary | ICD-10-CM | POA: Diagnosis present

## 2018-08-22 DIAGNOSIS — Z79899 Other long term (current) drug therapy: Secondary | ICD-10-CM | POA: Diagnosis not present

## 2018-08-22 DIAGNOSIS — E785 Hyperlipidemia, unspecified: Secondary | ICD-10-CM | POA: Diagnosis present

## 2018-08-22 DIAGNOSIS — I6522 Occlusion and stenosis of left carotid artery: Secondary | ICD-10-CM | POA: Diagnosis present

## 2018-08-22 DIAGNOSIS — I340 Nonrheumatic mitral (valve) insufficiency: Secondary | ICD-10-CM | POA: Diagnosis present

## 2018-08-22 DIAGNOSIS — I252 Old myocardial infarction: Secondary | ICD-10-CM

## 2018-08-22 DIAGNOSIS — D62 Acute posthemorrhagic anemia: Secondary | ICD-10-CM | POA: Diagnosis not present

## 2018-08-22 DIAGNOSIS — M791 Myalgia, unspecified site: Secondary | ICD-10-CM | POA: Diagnosis present

## 2018-08-22 DIAGNOSIS — T82855A Stenosis of coronary artery stent, initial encounter: Principal | ICD-10-CM | POA: Diagnosis present

## 2018-08-22 DIAGNOSIS — Z01818 Encounter for other preprocedural examination: Secondary | ICD-10-CM

## 2018-08-22 DIAGNOSIS — Z951 Presence of aortocoronary bypass graft: Secondary | ICD-10-CM

## 2018-08-22 DIAGNOSIS — Z7902 Long term (current) use of antithrombotics/antiplatelets: Secondary | ICD-10-CM | POA: Diagnosis not present

## 2018-08-22 DIAGNOSIS — F419 Anxiety disorder, unspecified: Secondary | ICD-10-CM | POA: Diagnosis present

## 2018-08-22 DIAGNOSIS — D696 Thrombocytopenia, unspecified: Secondary | ICD-10-CM | POA: Diagnosis present

## 2018-08-22 DIAGNOSIS — I2510 Atherosclerotic heart disease of native coronary artery without angina pectoris: Secondary | ICD-10-CM

## 2018-08-22 DIAGNOSIS — Z885 Allergy status to narcotic agent status: Secondary | ICD-10-CM

## 2018-08-22 DIAGNOSIS — I200 Unstable angina: Secondary | ICD-10-CM | POA: Diagnosis not present

## 2018-08-22 DIAGNOSIS — Z7982 Long term (current) use of aspirin: Secondary | ICD-10-CM | POA: Diagnosis not present

## 2018-08-22 DIAGNOSIS — Z01810 Encounter for preprocedural cardiovascular examination: Secondary | ICD-10-CM | POA: Diagnosis not present

## 2018-08-22 DIAGNOSIS — Y831 Surgical operation with implant of artificial internal device as the cause of abnormal reaction of the patient, or of later complication, without mention of misadventure at the time of the procedure: Secondary | ICD-10-CM | POA: Diagnosis present

## 2018-08-22 HISTORY — PX: LEFT HEART CATH AND CORONARY ANGIOGRAPHY: CATH118249

## 2018-08-22 LAB — PREGNANCY, URINE: Preg Test, Ur: NEGATIVE

## 2018-08-22 SURGERY — LEFT HEART CATH AND CORONARY ANGIOGRAPHY
Anesthesia: LOCAL

## 2018-08-22 MED ORDER — SODIUM CHLORIDE 0.9 % IV SOLN
250.0000 mL | INTRAVENOUS | Status: DC | PRN
  Administered 2018-08-27: 11:00:00 via INTRAVENOUS

## 2018-08-22 MED ORDER — OMEGA-3-ACID ETHYL ESTERS 1 G PO CAPS: 1.0000 g | ORAL_CAPSULE | Freq: Two times a day (BID) | ORAL | Status: DC

## 2018-08-22 MED ORDER — SODIUM CHLORIDE 0.9% FLUSH
3.0000 mL | Freq: Two times a day (BID) | INTRAVENOUS | Status: DC
  Administered 2018-08-23: 3 mL via INTRAVENOUS

## 2018-08-22 MED ORDER — SODIUM CHLORIDE 0.9 % IV SOLN: 250.0000 mL | INTRAVENOUS | Status: DC | PRN

## 2018-08-22 MED ORDER — LIDOCAINE HCL (PF) 1 % IJ SOLN
INTRAMUSCULAR | Status: DC | PRN
  Administered 2018-08-22: 10 mL

## 2018-08-22 MED ORDER — ASPIRIN 81 MG PO CHEW: 81.0000 mg | CHEWABLE_TABLET | ORAL | Status: DC

## 2018-08-22 MED ORDER — SODIUM CHLORIDE 0.9 % WEIGHT BASED INFUSION
3.0000 mL/kg/h | INTRAVENOUS | Status: DC
  Administered 2018-08-22: 3 mL/kg/h via INTRAVENOUS

## 2018-08-22 MED ORDER — VITAMIN D 1000 UNITS PO TABS
5000.0000 [IU] | ORAL_TABLET | Freq: Every day | ORAL | Status: DC
  Administered 2018-08-23 – 2018-08-31 (×5): 5000 [IU] via ORAL
  Filled 2018-08-22 (×10): qty 5

## 2018-08-22 MED ORDER — LIDOCAINE HCL (PF) 1 % IJ SOLN
INTRAMUSCULAR | Status: AC
  Filled 2018-08-22: qty 30

## 2018-08-22 MED ORDER — LOSARTAN POTASSIUM 50 MG PO TABS
50.0000 mg | ORAL_TABLET | Freq: Every day | ORAL | Status: DC
  Administered 2018-08-23 – 2018-08-26 (×4): 50 mg via ORAL
  Filled 2018-08-22 (×4): qty 1

## 2018-08-22 MED ORDER — LABETALOL HCL 5 MG/ML IV SOLN
INTRAVENOUS | Status: AC
  Filled 2018-08-22: qty 4

## 2018-08-22 MED ORDER — LABETALOL HCL 5 MG/ML IV SOLN
INTRAVENOUS | Status: DC | PRN
  Administered 2018-08-22 (×2): 10 mg via INTRAVENOUS

## 2018-08-22 MED ORDER — SODIUM CHLORIDE 0.9 % WEIGHT BASED INFUSION: 1.0000 mL/kg/h | INTRAVENOUS | Status: AC

## 2018-08-22 MED ORDER — FENTANYL CITRATE (PF) 100 MCG/2ML IJ SOLN
INTRAMUSCULAR | Status: DC | PRN
  Administered 2018-08-22 (×2): 25 ug via INTRAVENOUS

## 2018-08-22 MED ORDER — SODIUM CHLORIDE 0.9% FLUSH: 3.0000 mL | INTRAVENOUS | Status: DC | PRN

## 2018-08-22 MED ORDER — ASPIRIN EC 81 MG PO TBEC
81.0000 mg | DELAYED_RELEASE_TABLET | Freq: Every day | ORAL | Status: DC
  Administered 2018-08-23 – 2018-08-26 (×4): 81 mg via ORAL
  Filled 2018-08-22 (×4): qty 1

## 2018-08-22 MED ORDER — MIDAZOLAM HCL 2 MG/2ML IJ SOLN
INTRAMUSCULAR | Status: AC
  Filled 2018-08-22: qty 2

## 2018-08-22 MED ORDER — HEPARIN (PORCINE) IN NACL 1000-0.9 UT/500ML-% IV SOLN
INTRAVENOUS | Status: DC | PRN
  Administered 2018-08-22: 500 mL

## 2018-08-22 MED ORDER — NITROFURANTOIN MACROCRYSTAL 25 MG PO CAPS: 25.0000 mg | ORAL_CAPSULE | Freq: Every day | ORAL | Status: DC | PRN

## 2018-08-22 MED ORDER — IOHEXOL 350 MG/ML SOLN
INTRAVENOUS | Status: DC | PRN
  Administered 2018-08-22: 80 mL via INTRA_ARTERIAL

## 2018-08-22 MED ORDER — MIDAZOLAM HCL 2 MG/2ML IJ SOLN
INTRAMUSCULAR | Status: DC | PRN
  Administered 2018-08-22: 2 mg via INTRAVENOUS

## 2018-08-22 MED ORDER — ACETAMINOPHEN 325 MG PO TABS
650.0000 mg | ORAL_TABLET | ORAL | Status: DC | PRN
  Administered 2018-08-25: 650 mg via ORAL
  Filled 2018-08-22: qty 2

## 2018-08-22 MED ORDER — HEPARIN (PORCINE) IN NACL 1000-0.9 UT/500ML-% IV SOLN
INTRAVENOUS | Status: AC
  Filled 2018-08-22: qty 1000

## 2018-08-22 MED ORDER — NITROGLYCERIN 0.4 MG SL SUBL: 0.4000 mg | SUBLINGUAL_TABLET | SUBLINGUAL | Status: DC | PRN

## 2018-08-22 MED ORDER — ONDANSETRON HCL 4 MG/2ML IJ SOLN: 4.0000 mg | Freq: Four times a day (QID) | INTRAMUSCULAR | Status: DC | PRN

## 2018-08-22 MED ORDER — HEPARIN (PORCINE) IN NACL 100-0.45 UNIT/ML-% IJ SOLN
900.0000 [IU]/h | INTRAMUSCULAR | Status: DC
  Administered 2018-08-22: 750 [IU]/h via INTRAVENOUS
  Administered 2018-08-24 – 2018-08-26 (×3): 900 [IU]/h via INTRAVENOUS
  Filled 2018-08-22 (×4): qty 250

## 2018-08-22 MED ORDER — SODIUM CHLORIDE 0.9% FLUSH: 3.0000 mL | Freq: Two times a day (BID) | INTRAVENOUS | Status: DC

## 2018-08-22 MED ORDER — ROSUVASTATIN CALCIUM 10 MG PO TABS
5.0000 mg | ORAL_TABLET | Freq: Every day | ORAL | Status: DC
  Administered 2018-08-23 – 2018-08-31 (×8): 5 mg via ORAL
  Filled 2018-08-22 (×8): qty 1

## 2018-08-22 MED ORDER — FENTANYL CITRATE (PF) 100 MCG/2ML IJ SOLN
INTRAMUSCULAR | Status: AC
  Filled 2018-08-22: qty 2

## 2018-08-22 MED ORDER — SODIUM CHLORIDE 0.9 % WEIGHT BASED INFUSION: 1.0000 mL/kg/h | INTRAVENOUS | Status: DC

## 2018-08-22 MED ORDER — ALPRAZOLAM 0.25 MG PO TABS
0.2500 mg | ORAL_TABLET | Freq: Every day | ORAL | Status: DC | PRN
  Administered 2018-08-22 – 2018-08-28 (×5): 0.25 mg via ORAL
  Filled 2018-08-22 (×5): qty 1

## 2018-08-22 SURGICAL SUPPLY — 11 items
CATH INFINITI 5FR MULTPACK ANG (CATHETERS) ×2 IMPLANT
DEVICE CLOSURE MYNXGRIP 5F (Vascular Products) ×2 IMPLANT
KIT HEART LEFT (KITS) ×2 IMPLANT
KIT MICROPUNCTURE NIT STIFF (SHEATH) ×2 IMPLANT
PACK CARDIAC CATHETERIZATION (CUSTOM PROCEDURE TRAY) ×2 IMPLANT
SHEATH PINNACLE 5F 10CM (SHEATH) ×2 IMPLANT
SHEATH PROBE COVER 6X72 (BAG) ×2 IMPLANT
SYR MEDRAD MARK V 150ML (SYRINGE) ×2 IMPLANT
TRANSDUCER W/STOPCOCK (MISCELLANEOUS) ×2 IMPLANT
TUBING CIL FLEX 10 FLL-RA (TUBING) ×2 IMPLANT
WIRE EMERALD 3MM-J .035X150CM (WIRE) ×2 IMPLANT

## 2018-08-22 NOTE — Consult Note (Signed)
[address]       [place] [address]             [phone]      Cardiothoracic Surgery Consultation  Reason for Consult: Severe single vessel CAD Referring Physician: Dr. Justin Mend is an 47 y.o. female.  HPI:   Patient is a 47 year old woman with a history of hypertension, previous smoking, family history of heart disease, and coronary artery disease who underwent stenting of the mid LAD and 09/2016 for 95% stenosis with unstable angina.  She did well until July 2019 when she presented with in-stent restenosis in the proximal to mid LAD which was successfully treated with angioplasty and drug-eluting stent placement with an additional overlapping stent placed distally due to a suspected edge dissection.  She has been continued on aspirin 81 mg and Plavix 75 mg daily.  She presents with a 3-week history of recurrent exertional chest discomfort.  She works out frequently and decide to do her own stress test.  She walked on a treadmill at a incline for about 30 minutes and then started having chest discomfort as well as discomfort in her left arm radiating up into the chest with some nausea.  This was similar to her previous symptoms and prompted her presentation.  Electrocardiogram showed sinus bradycardia with an old septal infarct but no new changes.  Cardiac catheterization today showed 95% proximal to mid LAD stenosis within the previously placed stents.  Left ventricular ejection fraction was normal at 65%.  She had some chest pain during the catheterization was significantly elevated blood pressure response.  Past Medical History:  Diagnosis Date  . Anemia    "as a child"  . Anxiety   . Bursitis   . Childhood asthma   . Chronic lower back pain   . Coronary artery disease    a. 09/2016: cath showing 95% stenosis of the mid-LAD. Xience 2.5x29m DES placed, remaining left and right coronary tree angiographically normal, lip swelling noted at the end  of the case without rash or respiratory distress, treated with IV Solu-Medrol and Benadryl.  .Marland KitchenHistory of nuclear stress test    a. 07/2016: no sig ischemia, nl wall motion, EF 72%, small defect along mid to apical anteroseptal wall c/w breast attenuation artifact, low risk study  . Hypertension   . Migraine    "it was a bout; beginning of 2017; for ~ 1 month; went away" (05/23/2018)  . Mitral regurgitation    a. echo 08/2016: vigorous EF 65-70%, nl wall motion, nl LV diastolic fxn, mild MR  . Tendonitis     Past Surgical History:  Procedure Laterality Date  . CARDIAC CATHETERIZATION N/A 10/05/2016   Procedure: Left Heart Cath and Coronary Angiography;  Surgeon: MWellington Hampshire MD;  [facility];  Service: Cardiovascular;  Laterality: N/A;  . CARDIAC CATHETERIZATION N/A 10/05/2016   Procedure: Coronary Stent Intervention;  Surgeon: MWellington Hampshire MD;  [facility];  Service: Cardiovascular;  Laterality: N/A;  . CESAREAN SECTION  2003; 2005  . CORONARY ANGIOPLASTY WITH STENT PLACEMENT  05/23/2018   "2 stents"  . CORONARY STENT INTERVENTION N/A 05/23/2018   Procedure: CORONARY STENT INTERVENTION;  Surgeon: AWellington Hampshire MD;  [facility];  Service: Cardiovascular;  Laterality: N/A;  mid LAD  . ECTOPIC PREGNANCY SURGERY  1991  . LEFT HEART CATH AND CORONARY ANGIOGRAPHY N/A 05/23/2018   Procedure: LEFT  HEART CATH AND CORONARY ANGIOGRAPHY;  Surgeon: Wellington Hampshire, MD;  Location: [facility];  Service: Cardiovascular;  Laterality: N/A;    Family History  Problem Relation Age of Onset  . Heart disease Mother     Social History:  reports that she quit smoking about 12 years ago. Her smoking use included cigarettes. She has a 11.25 pack-year smoking history. She has never used smokeless tobacco. She reports that she drinks alcohol. She reports that she does not use drugs.  Allergies:  Allergies  Allergen Reactions  . Codeine  Nausea And Vomiting  . Contrast Media [Iodinated Diagnostic Agents] Swelling    The patient had mild lip swelling at the end of the case without rash or respiratory distress. This might be due to dye reaction. 2017    Medications:  I have reviewed the patient's current medications. Prior to Admission:  Medications Prior to Admission  Medication Sig Dispense Refill Last Dose  . ALPRAZolam (XANAX) 0.25 MG tablet Take 1 tablet (0.25 mg total) by mouth daily as needed for anxiety. 15 tablet 0 Past Week at Unknown time  . aspirin EC 81 MG tablet Take 1 tablet (81 mg total) by mouth daily. 90 tablet 3 08/22/2018 at 1000  . Cholecalciferol (VITAMIN D3) 5000 units CAPS Take 5,000 Units by mouth daily.    08/21/2018 at Unknown time  . clopidogrel (PLAVIX) 75 MG tablet Take 1 tablet (75 mg total) by mouth daily with breakfast. 90 tablet 2 08/22/2018 at 1000  . diphenhydrAMINE (BENADRYL) 50 MG tablet Take one 50 mg tablet the morning of the procedure with the last dose of the prednisone. 1 tablet 0 08/22/2018 at 1000  . losartan (COZAAR) 50 MG tablet Take 50 mg by mouth daily.    08/22/2018 at 1000  . Omega-3 Fatty Acids (FISH OIL PO) Take 1 capsule by mouth daily.    Past Month at Unknown time  . predniSONE (DELTASONE) 50 MG tablet 13 hours prior to the procedure take one 50 mg tab, 7 hours prior one 50 mg tab, 1hr before you leave for the hospital take one 50 mg tab 3 tablet 0 08/22/2018 at 1100  . Probiotic Product (PROBIOTIC PO) Take 1 capsule by mouth daily.   08/22/2018 at 1100  . rosuvastatin (CRESTOR) 5 MG tablet Take 1 tablet (5 mg total) by mouth daily. 90 tablet 3 08/22/2018 at 1000  . nitrofurantoin (MACRODANTIN) 25 MG capsule Take 25 mg by mouth daily as needed (for UTI prevention).    More than a month at Unknown time  . nitroGLYCERIN (NITROSTAT) 0.4 MG SL tablet Place 1 tablet (0.4 mg total) under the tongue every 5 (five) minutes as needed for chest pain. 25 tablet 2 Unknown at Unknown time    Scheduled: . [START ON 08/23/2018] aspirin EC  81 mg Oral Daily  . [START ON 08/23/2018] cholecalciferol  5,000 Units Oral Daily  . [START ON 08/23/2018] losartan  50 mg Oral Daily  . [START ON 08/23/2018] rosuvastatin  5 mg Oral Daily  . [START ON 08/23/2018] sodium chloride flush  3 mL Intravenous Q12H   Continuous: . [START ON 08/23/2018] sodium chloride    . sodium chloride 1 mL/kg/hr (08/22/18 1429)  . heparin     PRN:[START ON 08/23/2018] sodium chloride, acetaminophen, ALPRAZolam, nitroGLYCERIN, ondansetron (ZOFRAN) IV, [START ON 08/23/2018] sodium chloride flush Anti-infectives (From admission, onward)   None      Results for orders placed or performed during the hospital encounter of  08/22/18 (from the past 48 hour(s))  Pregnancy, urine     Status: None   Collection Time: 08/22/18 11:50 AM  Result Value Ref Range   Preg Test, Ur NEGATIVE NEGATIVE    Comment:        THE SENSITIVITY OF THIS METHODOLOGY IS >20 mIU/mL. Performed at [facility], [place] [address] [place], [place] [address]     No results found.  Review of Systems  Constitutional: Negative.   HENT: Negative.   Eyes: Negative.   Respiratory: Positive for shortness of breath.   Cardiovascular: Positive for chest pain and palpitations. Negative for leg swelling.  Gastrointestinal: Positive for nausea.  Genitourinary: Negative.   Musculoskeletal: Negative.   Skin: Negative.   Neurological: Negative.   Endo/Heme/Allergies: Negative.   Psychiatric/Behavioral: Negative.    Blood pressure (!) 150/64, pulse 71, temperature 98.5 F (36.9 C), temperature source Oral, resp. rate 13, height 5' 6.5" (1.689 m), weight 62.5 kg, last menstrual period 05/11/2018, SpO2 100 %. Physical Exam  Constitutional: She is oriented to person, place, and time. She appears well-developed and well-nourished. No distress.  HENT:  Head: Normocephalic and atraumatic.  Mouth/Throat: Oropharynx is clear and moist.  Eyes: Pupils  are equal, round, and reactive to light. Conjunctivae and EOM are normal.  Neck: Normal range of motion. Neck supple. No JVD present. No thyromegaly present.  Cardiovascular: Normal rate, regular rhythm, normal heart sounds and intact distal pulses.  No murmur heard. Respiratory: Effort normal and breath sounds normal. No respiratory distress.  GI: Soft. Bowel sounds are normal. She exhibits no distension. There is no tenderness.  Musculoskeletal: Normal range of motion. She exhibits no edema.  Lymphadenopathy:    She has no cervical adenopathy.  Neurological: She is alert and oriented to person, place, and time. No cranial nerve deficit.  Skin: Skin is warm and dry.  Psychiatric: She has a normal mood and affect.    Physicians   Panel Physicians Referring Physician Case Authorizing Physician  Wellington Hampshire, MD (Primary)    Procedures   LEFT HEART CATH AND CORONARY ANGIOGRAPHY  Conclusion     Prox LAD to Mid LAD lesion is 95% stenosed.  The left ventricular systolic function is normal.  LV end diastolic pressure is normal.  The left ventricular ejection fraction is greater than 65% by visual estimate.   1.  Severe one-vessel coronary artery disease due to recurrent in-stent restenosis in the mid LAD.  No other obstructive disease. 2.  Normal LV systolic function and normal left ventricular end-diastolic pressure.  Recommendations: This is a difficult situation overall.  This is the second time for in-stent restenosis in the LAD in spite of using 2 different drug-eluting stents.  The vessel was stented just 3 months ago.  I do not think we have good options for getting good long-term results with stents or repeat balloon angioplasty.  I am going to obtain a surgical opinion for off-pump LIMA to LAD.  The patient unfortunately started having chest pain during cardiac catheterization with significantly elevated blood pressure.  I am going to admit the patient to telemetry.   She has been on Plavix which I am going to discontinue in preparation for surgery.  Start heparin drip 8 hours after sheath pull without bolus.      Indications   Unstable angina (HCC) [I20.0 (ICD-10-CM)]  Procedural Details/Technique   Technical Details Procedural details: The right groin was prepped, draped, and anesthetized with 1% lidocaine. Ultrasound guidance was used  to identify the right common femoral artery which was found to be patent. A permanent image was stored. A micropuncture kit was used to obtain arterial access. The sheath was exchanged into a 5 [place] sheath. Using modified Seldinger technique, a 5 French sheath was introduced into the right femoral artery. Standard Judkins catheters were used for coronary angiography and left ventriculography. Catheter exchanges were performed over a guidewire. Femoral hemostasis was achieved with a minx closure device. There were no immediate procedural complications. The patient was transferred to the post catheterization recovery area for further monitoring.    Estimated blood loss <50 mL.  During this procedure the patient was administered the following to achieve and maintain moderate conscious sedation: Versed 2 mg, Fentanyl 50 mcg, while the patient's heart rate, blood pressure, and oxygen saturation were continuously monitored. The period of conscious sedation was 31 minutes, of which I was present face-to-face 100% of this time.  Coronary Findings   Diagnostic  Dominance: Right  Left Anterior Descending  Prox LAD to Mid LAD lesion 95% stenosed  Prox LAD to Mid LAD lesion is 95% stenosed. The lesion was previously treated.  First Diagonal Branch  Vessel is small in size.  Second Diagonal Branch  Vessel is angiographically normal.  Third Diagonal Branch  Vessel is angiographically normal.  Left Circumflex  Vessel is small. Vessel is angiographically normal.  First Obtuse Marginal Branch  Vessel is angiographically  normal.  Second Obtuse Marginal Branch  Vessel is angiographically normal.  Third Obtuse Marginal Branch  Vessel is angiographically normal.  Right Coronary Artery  Vessel is angiographically normal.  Right Posterior Descending Artery  Vessel is angiographically normal.  Right Posterior Atrioventricular Branch  Vessel is angiographically normal.  First Right Posterolateral  Vessel is angiographically normal.  Second Right Posterolateral  Vessel is angiographically normal.  Third Right Posterolateral  Vessel is angiographically normal.  Intervention   No interventions have been documented.  Wall Motion   Resting       All segments of the heart are normal.          Left Heart   Left Ventricle The left ventricular size is normal. The left ventricular systolic function is normal. LV end diastolic pressure is normal. The left ventricular ejection fraction is greater than 65% by visual estimate. No regional wall motion abnormalities.  Coronary Diagrams   Diagnostic Diagram       Implants    Vascular Products  Device Closure Mynxgrip [id number] Implanted    Inventory item: DEVICE CLOSURE MYNXGRIP 47F Model/Cat number: [id number] Manufacturer: [employer] Lot number: [id number] Device identifier: [id number] identifier type: GS1  Area Of Implantation: Femoral Artery    GUDID Information   Request status Successful    Brand name: [facility] MonicaVersion/Model: MDundeename: [employer] MRI safety info as of 08/22/18: Labeling does not contain MRI Safety Information  Contains dry or latex rubber: No    GMDN P.T. name: Wound hydrogel dressing, sterile    As of 08/22/2018   Status: Implanted      MERGE Images   Show images for CARDIAC CATHETERIZATION   Link to Procedure Log   Procedure Log    Hemo Data    Most Recent Value  AO Systolic Pressure 1376mmHg  AO Diastolic Pressure 73 mmHg  AO Mean 1283mmHg  LV Systolic Pressure 1151mmHg   LV Diastolic Pressure 2 mmHg  LV EDP 9 mmHg  AOp Systolic Pressure  159 mmHg  AOp Diastolic Pressure 73 mmHg  AOp Mean Pressure 952 mmHg  LVp Systolic Pressure 841 mmHg  LVp Diastolic Pressure 1 mmHg  LVp EDP Pressure 7 mmHg    Assessment/Plan:  This 47 year old woman has had a second episode of in-stent restenosis in the proximal to mid LAD, most recently in July 2019 which was treated with drug-eluting stents.  I think the best option at this point is to proceed with coronary bypass graft surgery with a left internal mammary graft to the LAD.  She has been on Plavix and aspirin.  I will check a P2Y12 level tomorrow and if her platelet function appears fairly normal I would plan to proceed with coronary bypass surgery on Friday.  If there is significant platelet inhibition then we will wait until Monday.  The Plavix has been discontinued.  She will remain in the hospital on heparin until the time of surgery. I discussed the operative procedure with the patient  including alternatives, benefits and risks; including but not limited to bleeding, blood transfusion, infection, stroke, myocardial infarction, graft failure, heart block requiring a permanent pacemaker, organ dysfunction, and death.  [name] understands and agrees to proceed.    I spent 60 minutes performing this consultation and > 50% of this time was spent face to face counseling and coordinating the care of this patient's severe single-vessel coronary disease.  Gaye Pollack 08/22/2018, 5:38 PM

## 2018-08-22 NOTE — Progress Notes (Signed)
Pt arrived to 4E11 from cath lab. A&Ox4;VSS; R groin Level 0. Pt educated to remain on bedrest until 1600. Tele applied; CCMD notified. Pt resting in bed with call bell within reach.  Will continue to monitor.  Arnell Sieving, RN, BSN

## 2018-08-22 NOTE — Progress Notes (Signed)
ANTICOAGULATION CONSULT NOTE - Initial Consult  Pharmacy Consult for heparin Indication: chest pain/ACS  Allergies  Allergen Reactions  . Codeine Nausea And Vomiting  . Contrast Media [Iodinated Diagnostic Agents] Swelling    The patient had mild lip swelling at the end of the case without rash or respiratory distress. This might be due to dye reaction. 2017    Patient Measurements: Height: 5' 6.5" (168.9 cm) Weight: 138 lb (62.6 kg) IBW/kg (Calculated) : 60.45 Heparin Dosing Weight: 62kg  Vital Signs: Temp: 98.4 F (36.9 C) (10/02 1110) Temp Source: Oral (10/02 1110) BP: 169/84 (10/02 1359) Pulse Rate: 82 (10/02 1359)  Labs: No results for input(s): HGB, HCT, PLT, APTT, LABPROT, INR, HEPARINUNFRC, HEPRLOWMOCWT, CREATININE, CKTOTAL, CKMB, TROPONINI in the last 72 hours.  Estimated Creatinine Clearance: 73 mL/min (by C-G formula based on SCr of 0.92 mg/dL).   Medical History: Past Medical History:  Diagnosis Date  . Anemia    "as a child"  . Anxiety   . Bursitis   . Childhood asthma   . Chronic lower back pain   . Coronary artery disease    a. 09/2016: cath showing 95% stenosis of the mid-LAD. Xience 2.5x15mm DES placed, remaining left and right coronary tree angiographically normal, lip swelling noted at the end of the case without rash or respiratory distress, treated with IV Solu-Medrol and Benadryl.  Marland Kitchen History of nuclear stress test    a. 07/2016: no sig ischemia, nl wall motion, EF 72%, small defect along mid to apical anteroseptal wall c/w breast attenuation artifact, low risk study  . Hypertension   . Migraine    "it was a bout; beginning of 2017; for ~ 1 month; went away" (05/23/2018)  . Mitral regurgitation    a. echo 08/2016: vigorous EF 65-70%, nl wall motion, nl LV diastolic fxn, mild MR  . Tendonitis      Assessment: 63 yoF admitted for LHC found to have in-stent restenosis and recurrent CP. Pt to be admitted for IV heparin 8hr after sheath removal  (~1400) and surgical consult. No OAC PTA.  Goal of Therapy:  Heparin level 0.3-0.7 units/ml Monitor platelets by anticoagulation protocol: Yes   Plan:  -IV heparin 750 units/hr with no bolus at 2200 -Check 6hr heparin level with am labs  Arrie Senate, PharmD, BCPS Clinical Pharmacist [phone] Please check AMION for all [place] numbers 08/22/2018

## 2018-08-22 NOTE — Interval H&P Note (Signed)
Cath Lab Visit (complete for each Cath Lab visit)  Clinical Evaluation Leading to the Procedure:   ACS:no  Non-ACS:    Anginal Classification: CCS III  Anti-ischemic medical therapy: Minimal Therapy (1 class of medications)  Non-Invasive Test Results: No non-invasive testing performed  Prior CABG: No previous CABG      History and Physical Interval Note:  08/22/2018 1:17 PM  Kim Villarreal  has presented today for surgery, with the diagnosis of UA  The various methods of treatment have been discussed with the patient and family. After consideration of risks, benefits and other options for treatment, the patient has consented to  Procedure(s): LEFT HEART CATH AND CORONARY ANGIOGRAPHY (N/A) as a surgical intervention .  The patient's history has been reviewed, patient examined, no change in status, stable for surgery.  I have reviewed the patient's chart and labs.  Questions were answered to the patient's satisfaction.     Kim Villarreal

## 2018-08-23 ENCOUNTER — Inpatient Hospital Stay (HOSPITAL_COMMUNITY)

## 2018-08-23 ENCOUNTER — Encounter (HOSPITAL_COMMUNITY): Payer: Self-pay | Admitting: Cardiovascular Disease

## 2018-08-23 ENCOUNTER — Other Ambulatory Visit: Payer: Self-pay

## 2018-08-23 DIAGNOSIS — I25110 Atherosclerotic heart disease of native coronary artery with unstable angina pectoris: Secondary | ICD-10-CM

## 2018-08-23 DIAGNOSIS — Z01810 Encounter for preprocedural cardiovascular examination: Secondary | ICD-10-CM

## 2018-08-23 LAB — PULMONARY FUNCTION TEST
DL/VA % PRED: 94 %
DL/VA: 4.78 ml/min/mmHg/L
DLCO COR % PRED: 98 %
DLCO COR: 26.64 ml/min/mmHg
DLCO unc % pred: 96 %
DLCO unc: 25.96 ml/min/mmHg
FEF 25-75 POST: 3.7 L/s
FEF 25-75 Pre: 2.49 L/sec
FEF2575-%CHANGE-POST: 48 %
FEF2575-%PRED-PRE: 82 %
FEF2575-%Pred-Post: 122 %
FEV1-%CHANGE-POST: 11 %
FEV1-%Pred-Post: 105 %
FEV1-%Pred-Pre: 94 %
FEV1-Post: 3.24 L
FEV1-Pre: 2.9 L
FEV1FVC-%Change-Post: 4 %
FEV1FVC-%PRED-PRE: 95 %
FEV6-%Change-Post: 7 %
FEV6-%PRED-POST: 106 %
FEV6-%Pred-Pre: 99 %
FEV6-POST: 4.02 L
FEV6-Pre: 3.74 L
FEV6FVC-%Change-Post: 0 %
FEV6FVC-%PRED-POST: 101 %
FEV6FVC-%Pred-Pre: 102 %
FVC-%CHANGE-POST: 7 %
FVC-%PRED-POST: 104 %
FVC-%Pred-Pre: 97 %
FVC-POST: 4.03 L
FVC-Pre: 3.76 L
PRE FEV1/FVC RATIO: 77 %
PRE FEV6/FVC RATIO: 100 %
Post FEV1/FVC ratio: 80 %
Post FEV6/FVC ratio: 100 %
RV % pred: 107 %
RV: 1.94 L
TLC % PRED: 109 %
TLC: 5.86 L

## 2018-08-23 LAB — CBC
HCT: 39.1 % (ref 36.0–46.0)
Hemoglobin: 12.6 g/dL (ref 12.0–15.0)
MCH: 28.2 pg (ref 26.0–34.0)
MCHC: 32.2 g/dL (ref 30.0–36.0)
MCV: 87.5 fL (ref 78.0–100.0)
Platelets: 138 10*3/uL — ABNORMAL LOW (ref 150–400)
RBC: 4.47 MIL/uL (ref 3.87–5.11)
RDW: 13 % (ref 11.5–15.5)
WBC: 10.5 10*3/uL (ref 4.0–10.5)

## 2018-08-23 LAB — ECHOCARDIOGRAM COMPLETE
Height: 66.5 in
WEIGHTICAEL: 2204.6 [oz_av]

## 2018-08-23 LAB — HEPARIN LEVEL (UNFRACTIONATED)
HEPARIN UNFRACTIONATED: 0.28 [IU]/mL — AB (ref 0.30–0.70)
Heparin Unfractionated: 0.41 IU/mL (ref 0.30–0.70)

## 2018-08-23 LAB — PLATELET INHIBITION P2Y12: Platelet Function  P2Y12: 177 [PRU] — ABNORMAL LOW (ref 194–418)

## 2018-08-23 MED ORDER — ALBUTEROL SULFATE (2.5 MG/3ML) 0.083% IN NEBU
2.5000 mg | INHALATION_SOLUTION | Freq: Once | RESPIRATORY_TRACT | Status: AC
  Administered 2018-08-23: 2.5 mg via RESPIRATORY_TRACT

## 2018-08-23 NOTE — Progress Notes (Signed)
Pre CABG prelim   Right Carotid:Velocities in the right ICA are consistent with a 1-39% stenosis.  Left Carotid: Velocities in the left ICA are consistent with a 40-59% stenosis.    Right ABI: Pedal artery waveforms within normal limits.  Left ABI: Pedal artery waveforms within normal limits.    Right Upper Extremity: Doppler waveforms remain within normal limits with right radial compression. Doppler waveform obliterate with right ulnar compression.  Left Upper Extremity: Doppler waveforms decrease >50% with left radial compression. Doppler waveforms remain within normal limits with left ulnar compression.   Landry Mellow, RDMS, RVT

## 2018-08-23 NOTE — Progress Notes (Signed)
ANTICOAGULATION CONSULT NOTE - Follow Up Consult  Pharmacy Consult for Heparin Indication: chest pain/ACS, CAD, for CABG on 10/7  Allergies  Allergen Reactions  . Codeine Nausea And Vomiting  . Contrast Media [Iodinated Diagnostic Agents] Swelling    The patient had mild lip swelling at the end of the case without rash or respiratory distress. This might be due to dye reaction. 2017    Patient Measurements: Height: 5' 6.5" (168.9 cm) Weight: 137 lb 12.6 oz (62.5 kg) IBW/kg (Calculated) : 60.45 Heparin Dosing Weight: 62 kg  Vital Signs: Temp: 97.9 F (36.6 C) (10/03 1220) Temp Source: Oral (10/03 1220) BP: 150/66 (10/03 1220) Pulse Rate: 62 (10/03 1220)  Labs: Recent Labs    08/23/18 0350 08/23/18 1225  HGB 12.6  --   HCT 39.1  --   PLT 138*  --   HEPARINUNFRC 0.28* 0.41    Estimated Creatinine Clearance: 73 mL/min (by C-G formula based on SCr of 0.92 mg/dL).  Assessment:  56 yoF admitted for LHC on 10/2 found to have in-stent restenosis. Planning CABG on 10/7 after Plavix washout.  Last Plavix dose 10/2.    Heparin begin 8 hrs after sheath out post-cath.   Initial heparin level was just below therapeutic range (0.28) on 750 units/hr, now therapeutic (0.41) on 900 units/hr.  Platelet count 156>138.  No bleeding reported.  Goal of Therapy:  Heparin level 0.3-0.7 units/ml Monitor platelets by anticoagulation protocol: Yes   Plan:   Continue heparin drip at 900 units/hr  Daily heparin level and CBC while on heparin.  Arty Baumgartner, [place] Pager: [phone] or phone: [phone] 08/23/2018,1:22 PM

## 2018-08-23 NOTE — Progress Notes (Signed)
  Echocardiogram 2D Echocardiogram has been performed.  Merrie Roof F 08/23/2018, 12:14 PM

## 2018-08-23 NOTE — Progress Notes (Signed)
CARDIAC REHAB PHASE I   PRE:  Rate/Rhythm: 71 SR    BP: sitting 162/65    SaO2:   MODE:  Ambulation: 790 ft   POST:  Rate/Rhythm: 76 SR    BP: sitting 160/66     SaO2:   Tolerated very well, happy to walk. BP elevated but she is quite stressed. We discussed sternal precautions, IS (unit needs to order her one), mobility, recuperation process, need for supervision at home. Voiced understanding, just overwhelmed. Gave written information. She declines watching preop video.  [id number]   [place], ACSM 08/23/2018 10:36 AM

## 2018-08-23 NOTE — Progress Notes (Signed)
Progress Note  Patient Name: Kim Villarreal Date of Encounter: 08/23/2018  Primary Cardiologist: No primary care provider on file. Dr. Fletcher Anon  Subjective   No chest pain.  SHe reports some anxiety.  Inpatient Medications    Scheduled Meds: . aspirin EC  81 mg Oral Daily  . cholecalciferol  5,000 Units Oral Daily  . losartan  50 mg Oral Daily  . rosuvastatin  5 mg Oral Daily  . sodium chloride flush  3 mL Intravenous Q12H   Continuous Infusions: . sodium chloride    . heparin 900 Units/hr (08/23/18 0525)   PRN Meds: sodium chloride, acetaminophen, ALPRAZolam, nitroGLYCERIN, ondansetron (ZOFRAN) IV, sodium chloride flush   Vital Signs    Vitals:   08/22/18 1957 08/22/18 2000 08/22/18 2100 08/23/18 0353  BP: (!) 156/60 (!) 160/67 136/62 133/63  Pulse: 76 81 76 62  Resp: (!) 24 (!) 21 17 16   Temp: 98 F (36.7 C)   98.3 F (36.8 C)  TempSrc: Oral   Oral  SpO2: 98% 99% 97% 97%  Weight:      Height:        Intake/Output Summary (Last 24 hours) at 08/23/2018 1141 Last data filed at 08/23/2018 0525 Gross per 24 hour  Intake 1373.25 ml  Output -  Net 1373.25 ml   Filed Weights   08/22/18 1110 08/22/18 1516  Weight: 62.6 kg 62.5 kg    Telemetry    NSR - Personally Reviewed  ECG    NSR, nonspecific ST changes - Personally Reviewed  Physical Exam    GEN: No acute distress.   Neck: No JVD Cardiac: RRR, no murmurs, rubs, or gallops.  Respiratory: Clear to auscultation bilaterally. GI: Soft, nontender, non-distended  MS: No edema; No deformity. Neuro:  Nonfocal  Psych: Normal affect   Labs    Chemistry Recent Labs  Lab 08/17/18 0858  NA 143  K 4.0  CL 105  CO2 23  GLUCOSE 100*  BUN 16  CREATININE 0.92  CALCIUM 9.8  GFRNONAA 75  GFRAA 86     Hematology Recent Labs  Lab 08/17/18 0858 08/23/18 0350  WBC 4.9 10.5  RBC 5.16 4.47  HGB 14.2 12.6  HCT 44.3 39.1  MCV 86 87.5  MCH 27.5 28.2  MCHC 32.1 32.2  RDW 13.6 13.0  PLT 149* 138*      Cardiac EnzymesNo results for input(s): TROPONINI in the last 168 hours. No results for input(s): TROPIPOC in the last 168 hours.   BNPNo results for input(s): BNP, PROBNP in the last 168 hours.   DDimer No results for input(s): DDIMER in the last 168 hours.   Radiology    No results found.  Cardiac Studies   Prior cath result reviewed  Patient Profile     47 y.o. female with ISR of LAD DES x2.  COnsidering LIMA to LAD.Marland Kitchen   Assessment & Plan    1) CAD: Dr. Fletcher Anon spoke with me about the case this morning.  He asked me to speak with the patient and offer an option of a less invasive CABG approach, that could be done at another hospital.  He spoke to the surgeon at [facility] who felt they could do a LIMA to LAD without median sternotomy.  THe concern was given how active she is, attempting to reduce recovery time. I explained that this option existed at [facility] if she wanted to transfer.  There was no guarantee that they could do the less invasive approach, but it  could be attempted.  SHe wanted to speak to her husband.    I came back to the room to meet the husband and spoke to him, while the patient was out of the room.  He said that ultimately, the decision would be up to the patient.   I spoke later to both the husband and wife together.  I explained that a good result with her heart was the most important thing, regardless of the approach.  THey will consider their options and decide.  Willl inform Dr. Fletcher Anon and will have PA check if they made a decision later in the day.    For questions or updates, please contact [place] Please consult [email or web address] for contact info under        Signed, Larae Grooms, MD  08/23/2018, 11:41 AM

## 2018-08-23 NOTE — Progress Notes (Signed)
[place] for heparin Indication: chest pain/ACS  Allergies  Allergen Reactions  . Codeine Nausea And Vomiting  . Contrast Media [Iodinated Diagnostic Agents] Swelling    The patient had mild lip swelling at the end of the case without rash or respiratory distress. This might be due to dye reaction. 2017    Patient Measurements: Height: 5' 6.5" (168.9 cm) Weight: 137 lb 12.6 oz (62.5 kg) IBW/kg (Calculated) : 60.45 Heparin Dosing Weight: 62kg  Vital Signs: Temp: 98.3 F (36.8 C) (10/03 0353) Temp Source: Oral (10/03 0353) BP: 133/63 (10/03 0353) Pulse Rate: 62 (10/03 0353)  Labs: Recent Labs    08/23/18 0350  HGB 12.6  HCT 39.1  PLT 138*  HEPARINUNFRC 0.28*    Estimated Creatinine Clearance: 73 mL/min (by C-G formula based on SCr of 0.92 mg/dL).   Medical History: Past Medical History:  Diagnosis Date  . Anemia    "as a child"  . Anxiety   . Bursitis   . Childhood asthma   . Chronic lower back pain   . Coronary artery disease    a. 09/2016: cath showing 95% stenosis of the mid-LAD. Xience 2.5x19mm DES placed, remaining left and right coronary tree angiographically normal, lip swelling noted at the end of the case without rash or respiratory distress, treated with IV Solu-Medrol and Benadryl.  Marland Kitchen History of nuclear stress test    a. 07/2016: no sig ischemia, nl wall motion, EF 72%, small defect along mid to apical anteroseptal wall c/w breast attenuation artifact, low risk study  . Hypertension   . Migraine    "it was a bout; beginning of 2017; for ~ 1 month; went away" (05/23/2018)  . Mitral regurgitation    a. echo 08/2016: vigorous EF 65-70%, nl wall motion, nl LV diastolic fxn, mild MR  . Tendonitis      Assessment: 63 yoF admitted for LHC found to have in-stent restenosis and recurrent CP. Pt to be admitted for IV heparin 8hr after sheath removal (~1400) and surgical consult. No OAC PTA.  Heparin level this AM slightly  below goal.  No overt bleeding or complications noted.  Goal of Therapy:  Heparin level 0.3-0.7 units/ml Monitor platelets by anticoagulation protocol: Yes   Plan:  -Increase IV heparin to 900 units/hr. -Check 6hr heparin level  -Daily heparin level and CBC.  Marguerite Olea, [facility] Clinical Pharmacist Phone [phone]  08/23/2018 5:07 AM

## 2018-08-24 ENCOUNTER — Encounter (HOSPITAL_COMMUNITY)

## 2018-08-24 DIAGNOSIS — I200 Unstable angina: Secondary | ICD-10-CM

## 2018-08-24 DIAGNOSIS — T82855A Stenosis of coronary artery stent, initial encounter: Secondary | ICD-10-CM

## 2018-08-24 LAB — CBC
HCT: 41.2 % (ref 36.0–46.0)
Hemoglobin: 13.1 g/dL (ref 12.0–15.0)
MCH: 28.2 pg (ref 26.0–34.0)
MCHC: 31.8 g/dL (ref 30.0–36.0)
MCV: 88.8 fL (ref 78.0–100.0)
PLATELETS: 120 10*3/uL — AB (ref 150–400)
RBC: 4.64 MIL/uL (ref 3.87–5.11)
RDW: 13.4 % (ref 11.5–15.5)
WBC: 7.1 10*3/uL (ref 4.0–10.5)

## 2018-08-24 LAB — HEPARIN LEVEL (UNFRACTIONATED): HEPARIN UNFRACTIONATED: 0.51 [IU]/mL (ref 0.30–0.70)

## 2018-08-24 NOTE — Progress Notes (Signed)
Pt has been seen walking independently. Yves Dill CES, ACSM 3:24 PM 08/24/2018

## 2018-08-24 NOTE — Progress Notes (Signed)
2 Days Post-Op Procedure(s) (LRB): LEFT HEART CATH AND CORONARY ANGIOGRAPHY (N/A) Subjective: She had some mild chest discomfort today but did not last long.  Objective: Vital signs in last 24 hours: Temp:  [97.8 F (36.6 C)-98 F (36.7 C)] 97.8 F (36.6 C) (10/04 1226) Pulse Rate:  [52-65] 65 (10/04 1226) Cardiac Rhythm: Normal sinus rhythm (10/04 0815) Resp:  [17] 17 (10/04 1226) BP: (156)/(54) 156/54 (10/04 1226) SpO2:  [96 %] 96 % (10/04 0426)  Hemodynamic parameters for last 24 hours:    Intake/Output from previous day: 10/03 0701 - 10/04 0700 In: 240 [P.O.:240] Out: 1 [Urine:1] Intake/Output this shift: No intake/output data recorded.  General appearance: alert and cooperative Heart: regular rate and rhythm, S1, S2 normal, no murmur, click, rub or gallop Lungs: clear to auscultation bilaterally  Lab Results: Recent Labs    08/23/18 0350 08/24/18 0407  WBC 10.5 7.1  HGB 12.6 13.1  HCT 39.1 41.2  PLT 138* 120*   BMET: No results for input(s): NA, K, CL, CO2, GLUCOSE, BUN, CREATININE, CALCIUM in the last 72 hours.  PT/INR: No results for input(s): LABPROT, INR in the last 72 hours. ABG No results found for: PHART, HCO3, TCO2, ACIDBASEDEF, O2SAT CBG (last 3)  No results for input(s): GLUCAP in the last 72 hours.  Assessment/Plan: S/P Procedure(s) (LRB): LEFT HEART CATH AND CORONARY ANGIOGRAPHY (N/A)  High grade LAD in-stent restenosis. Plavix held and on heparin drip. She has decided to stay here for CABG with off-pump LIMA to LAD via sternotomy. Will plan to do Monday am.   LOS: 2 days    Gaye Pollack 08/24/2018

## 2018-08-24 NOTE — Progress Notes (Signed)
Cardiothoracic Surgery  I talked with the patient and her husband yesterday afternoon about need for CABG with LIMA to LAD, possibly off-pump if anatomy is suitable and hemodynamics stable after LAD occluded above and below the anastomotic site, which is not always the case. I told them that I would only do that through a sternotomy which is the safest route in case of hemodynamic instability and allows the most complete mobilization of the LIMA pedicle which is important to prevent steal from proximal branches of the LIMA if they are not divided. They said that Dr. Fletcher Anon was referring her to [facility] for a LIMA to LAD via a small left thoracotomy if they wanted to pursue that. They are going to think about that. Her P2Y12 platelet inhibition was 177 so Plavix working for her and she should wait until Monday for surgery if possible. I will be happy to do LIMA to LAD through sternotomy on Monday unless she decides to transfer to [facility].

## 2018-08-24 NOTE — Progress Notes (Signed)
   Progress Note  Patient Name: Kim Villarreal Date of Encounter: 08/24/2018  Primary Cardiologist: No primary care provider on file. Dr. Fletcher Anon  Subjective   Had some mild chest pain but otherwise doing well.   Inpatient Medications    Scheduled Meds: . aspirin EC  81 mg Oral Daily  . cholecalciferol  5,000 Units Oral Daily  . losartan  50 mg Oral Daily  . rosuvastatin  5 mg Oral Daily  . sodium chloride flush  3 mL Intravenous Q12H   Continuous Infusions: . sodium chloride    . heparin 900 Units/hr (08/24/18 0111)   PRN Meds: sodium chloride, acetaminophen, ALPRAZolam, nitroGLYCERIN, ondansetron (ZOFRAN) IV, sodium chloride flush   Vital Signs    Vitals:   08/23/18 0353 08/23/18 1220 08/24/18 0426 08/24/18 1226  BP: 133/63 (!) 150/66  (!) 156/54  Pulse: 62 62 (!) 52 65  Resp: 16 (!) 23  17  Temp: 98.3 F (36.8 C) 97.9 F (36.6 C) 98 F (36.7 C) 97.8 F (36.6 C)  TempSrc: Oral Oral Oral Oral  SpO2: 97% 97% 96%   Weight:      Height:        Intake/Output Summary (Last 24 hours) at 08/24/2018 2052 Last data filed at 08/24/2018 2000 Gross per 24 hour  Intake 347.2 ml  Output -  Net 347.2 ml   Filed Weights   08/22/18 1110 08/22/18 1516  Weight: 62.6 kg 62.5 kg    Telemetry    NSR - Personally Reviewed  ECG    NSR, nonspecific ST changes - Personally Reviewed  Physical Exam    GEN: No acute distress.   Neck: No JVD Cardiac: RRR, no murmurs, rubs, or gallops.  Respiratory: Clear to auscultation bilaterally. GI: Soft, nontender, non-distended  MS: No edema; No deformity. Neuro:  Nonfocal  Psych: Normal affect   Labs    Chemistry No results for input(s): NA, K, CL, CO2, GLUCOSE, BUN, CREATININE, CALCIUM, PROT, ALBUMIN, AST, ALT, ALKPHOS, BILITOT, GFRNONAA, GFRAA, ANIONGAP in the last 168 hours.   Hematology Recent Labs  Lab 08/23/18 0350 08/24/18 0407  WBC 10.5 7.1  RBC 4.47 4.64  HGB 12.6 13.1  HCT 39.1 41.2  MCV 87.5 88.8  MCH 28.2  28.2  MCHC 32.2 31.8  RDW 13.0 13.4  PLT 138* 120*    Cardiac EnzymesNo results for input(s): TROPONINI in the last 168 hours. No results for input(s): TROPIPOC in the last 168 hours.   BNPNo results for input(s): BNP, PROBNP in the last 168 hours.   DDimer No results for input(s): DDIMER in the last 168 hours.   Radiology    No results found.  Cardiac Studies   Prior cath result reviewed  Patient Profile     47 y.o. female with ISR of LAD DES x2.  COnsidering LIMA to LAD.Marland Kitchen   Assessment & Plan    Active Problems:   Unstable angina (HCC)   Coronary stent restenosis  I spoke to the patient after her discussion with the surgeon, and she has decided to stay in [facility] for surgery on Monday with Dr. Gilford Raid.  For questions or updates, please contact [place] Please consult [email or web address] for contact info under     Signed, Elouise Munroe, MD  08/24/2018, 8:52 PM

## 2018-08-24 NOTE — Plan of Care (Signed)
  Problem: Health Behavior/Discharge Planning: Goal: Ability to manage health-related needs will improve Outcome: Progressing   

## 2018-08-24 NOTE — Progress Notes (Signed)
ANTICOAGULATION CONSULT NOTE - Follow Up Consult  Pharmacy Consult for Heparin Indication: chest pain/ACS, CAD, for CABG on 10/7  Allergies  Allergen Reactions  . Codeine Nausea And Vomiting  . Contrast Media [Iodinated Diagnostic Agents] Swelling    The patient had mild lip swelling at the end of the case without rash or respiratory distress. This might be due to dye reaction. 2017    Patient Measurements: Height: 5' 6.5" (168.9 cm) Weight: 137 lb 12.6 oz (62.5 kg) IBW/kg (Calculated) : 60.45 Heparin Dosing Weight: 62 kg  Vital Signs: Temp: 98 F (36.7 C) (10/04 0426) Temp Source: Oral (10/04 0426) Pulse Rate: 52 (10/04 0426)  Labs: Recent Labs    08/23/18 0350 08/23/18 1225 08/24/18 0407  HGB 12.6  --   --   HCT 39.1  --   --   PLT 138*  --   --   HEPARINUNFRC 0.28* 0.41 0.51    Estimated Creatinine Clearance: 73 mL/min (by C-G formula based on SCr of 0.92 mg/dL).  Assessment: 15 yoF admitted for LHC on 10/2 found to have in-stent restenosis. Planning CABG on 10/7 after Plavix washout.  Last Plavix dose 10/2.  Heparin level therapeutic, CBC stable   Goal of Therapy:  Heparin level 0.3-0.7 units/ml Monitor platelets by anticoagulation protocol: Yes   Plan:   Continue heparin drip at 900 units/hr  Daily heparin level and CBC while on heparin.  Thank you Anette Guarneri, PharmD [phone] 08/24/2018,8:33 AM

## 2018-08-25 DIAGNOSIS — I2510 Atherosclerotic heart disease of native coronary artery without angina pectoris: Secondary | ICD-10-CM

## 2018-08-25 DIAGNOSIS — I10 Essential (primary) hypertension: Secondary | ICD-10-CM

## 2018-08-25 DIAGNOSIS — E785 Hyperlipidemia, unspecified: Secondary | ICD-10-CM

## 2018-08-25 LAB — CBC
HEMATOCRIT: 42.1 % (ref 36.0–46.0)
HEMOGLOBIN: 13.5 g/dL (ref 12.0–15.0)
MCH: 28.1 pg (ref 26.0–34.0)
MCHC: 32.1 g/dL (ref 30.0–36.0)
MCV: 87.5 fL (ref 78.0–100.0)
Platelets: 136 10*3/uL — ABNORMAL LOW (ref 150–400)
RBC: 4.81 MIL/uL (ref 3.87–5.11)
RDW: 13 % (ref 11.5–15.5)
WBC: 7.5 10*3/uL (ref 4.0–10.5)

## 2018-08-25 LAB — HEPARIN LEVEL (UNFRACTIONATED): Heparin Unfractionated: 0.45 IU/mL (ref 0.30–0.70)

## 2018-08-25 NOTE — Progress Notes (Signed)
3 Days Post-Op Procedure(s) (LRB): LEFT HEART CATH AND CORONARY ANGIOGRAPHY (N/A) Subjective: No chest pain or shortness of breath. Ambulating in hall.  Objective: Vital signs in last 24 hours: Temp:  [97.7 F (36.5 C)-97.9 F (36.6 C)] 97.7 F (36.5 C) (10/05 1220) Pulse Rate:  [52-64] 64 (10/05 1220) Cardiac Rhythm: Normal sinus rhythm (10/05 0700) Resp:  [15-18] 15 (10/05 1220) BP: (143-144)/(54-77) 143/77 (10/05 1220) SpO2:  [99 %] 99 % (10/05 0929)  Hemodynamic parameters for last 24 hours:    Intake/Output from previous day: 10/04 0701 - 10/05 0700 In: 439.8 [I.V.:439.8] Out: -  Intake/Output this shift: No intake/output data recorded.  General appearance: alert and cooperative Heart: regular rate and rhythm, S1, S2 normal, no murmur, click, rub or gallop Lungs: clear to auscultation bilaterally  Lab Results: Recent Labs    08/24/18 0407 08/25/18 0317  WBC 7.1 7.5  HGB 13.1 13.5  HCT 41.2 42.1  PLT 120* 136*   BMET: No results for input(s): NA, K, CL, CO2, GLUCOSE, BUN, CREATININE, CALCIUM in the last 72 hours.  PT/INR: No results for input(s): LABPROT, INR in the last 72 hours. ABG No results found for: PHART, HCO3, TCO2, ACIDBASEDEF, O2SAT CBG (last 3)  No results for input(s): GLUCAP in the last 72 hours.  Assessment/Plan:  High grade LAD in-stent restenosis. Plan CABG Monday. Further questions answered today.  LOS: 3 days    Gaye Pollack 08/25/2018

## 2018-08-25 NOTE — Progress Notes (Signed)
ANTICOAGULATION CONSULT NOTE - Follow Up Consult  Pharmacy Consult for Heparin Indication: chest pain/ACS, CAD, for CABG on 10/7  Allergies  Allergen Reactions  . Codeine Nausea And Vomiting  . Contrast Media [Iodinated Diagnostic Agents] Swelling    The patient had mild lip swelling at the end of the case without rash or respiratory distress. This might be due to dye reaction. 2017    Patient Measurements: Height: 5' 6.5" (168.9 cm) Weight: 137 lb 12.6 oz (62.5 kg) IBW/kg (Calculated) : 60.45 Heparin Dosing Weight: 62 kg  Vital Signs: Temp: 97.9 F (36.6 C) (10/05 0929) Temp Source: Oral (10/05 0929) BP: 144/54 (10/05 0929) Pulse Rate: 52 (10/05 0929)  Labs: Recent Labs    08/23/18 0350 08/23/18 1225 08/24/18 0407 08/25/18 0317  HGB 12.6  --  13.1 13.5  HCT 39.1  --  41.2 42.1  PLT 138*  --  120* 136*  HEPARINUNFRC 0.28* 0.41 0.51 0.45    Estimated Creatinine Clearance: 73 mL/min (by C-G formula based on SCr of 0.92 mg/dL).  Assessment: Kim Villarreal admitted for LHC on 10/2 found to have in-stent restenosis. Planning CABG on 10/7 after Plavix washout.  Last Plavix dose 10/2.  Heparin level therapeutic, Hgb wnl, pltc low but stable.   Goal of Therapy:  Heparin level 0.3-0.7 units/ml Monitor platelets by anticoagulation protocol: Yes   Plan:  -Continue heparin drip at 900 units/hr -Daily heparin level and CBC while on heparin  Arrie Senate, PharmD, BCPS Clinical Pharmacist [phone] Please check AMION for all [place] numbers 08/25/2018

## 2018-08-25 NOTE — Progress Notes (Signed)
Progress Note  Patient Name: Kim Villarreal Date of Encounter: 08/25/2018  Primary Cardiologist: Dr. Fletcher Anon  Subjective   47 year old female with DES to LAD x2 with in-stent restenosis scheduled for off-pump LIMA to the LAD by Dr. Cyndia Bent on Monday.  She is pain-free on IV heparin.  Inpatient Medications    Scheduled Meds: . aspirin EC  81 mg Oral Daily  . cholecalciferol  5,000 Units Oral Daily  . losartan  50 mg Oral Daily  . rosuvastatin  5 mg Oral Daily  . sodium chloride flush  3 mL Intravenous Q12H   Continuous Infusions: . sodium chloride    . heparin 900 Units/hr (08/25/18 0525)   PRN Meds: sodium chloride, acetaminophen, ALPRAZolam, nitroGLYCERIN, ondansetron (ZOFRAN) IV, sodium chloride flush   Vital Signs    Vitals:   08/23/18 1220 08/24/18 0426 08/24/18 1226 08/25/18 0929  BP: (!) 150/66  (!) 156/54 (!) 144/54  Pulse: 62 (!) 52 65 (!) 52  Resp: (!) 23  17 18   Temp: 97.9 F (36.6 C) 98 F (36.7 C) 97.8 F (36.6 C) 97.9 F (36.6 C)  TempSrc: Oral Oral Oral Oral  SpO2: 97% 96%  99%  Weight:      Height:        Intake/Output Summary (Last 24 hours) at 08/25/2018 1126 Last data filed at 08/25/2018 0617 Gross per 24 hour  Intake 439.75 ml  Output -  Net 439.75 ml   Filed Weights   08/22/18 1110 08/22/18 1516  Weight: 62.6 kg 62.5 kg    Telemetry    Sinus rhythm- Personally Reviewed  ECG    Not performed today- Personally Reviewed  Physical Exam   GEN: No acute distress.   Neck: No JVD Cardiac: RRR, no murmurs, rubs, or gallops.  Respiratory: Clear to auscultation bilaterally. GI: Soft, nontender, non-distended  MS: No edema; No deformity. Neuro:  Nonfocal  Psych: Normal affect   Labs    ChemistryNo results for input(s): NA, K, CL, CO2, GLUCOSE, BUN, CREATININE, CALCIUM, PROT, ALBUMIN, AST, ALT, ALKPHOS, BILITOT, GFRNONAA, GFRAA, ANIONGAP in the last 168 hours.   Hematology Recent Labs  Lab 08/23/18 0350 08/24/18 0407  08/25/18 0317  WBC 10.5 7.1 7.5  RBC 4.47 4.64 4.81  HGB 12.6 13.1 13.5  HCT 39.1 41.2 42.1  MCV 87.5 88.8 87.5  MCH 28.2 28.2 28.1  MCHC 32.2 31.8 32.1  RDW 13.0 13.4 13.0  PLT 138* 120* 136*    Cardiac EnzymesNo results for input(s): TROPONINI in the last 168 hours. No results for input(s): TROPIPOC in the last 168 hours.   BNPNo results for input(s): BNP, PROBNP in the last 168 hours.   DDimer No results for input(s): DDIMER in the last 168 hours.   Radiology    No results found.  Cardiac Studies   2D echocardiogram (08/23/2018)  Study Conclusions  - Left ventricle: The cavity size was normal. Wall thickness was   normal. Systolic function was normal. The estimated ejection   fraction was in the range of 60% to 65%. - Left atrium: The atrium was mildly dilated.  Cardiac catheterization (08/22/2018)  Conclusion     Prox LAD to Mid LAD lesion is 95% stenosed.  The left ventricular systolic function is normal.  LV end diastolic pressure is normal.  The left ventricular ejection fraction is greater than 65% by visual estimate.   1.  Severe one-vessel coronary artery disease due to recurrent in-stent restenosis in the mid LAD.  No other obstructive  disease. 2.  Normal LV systolic function and normal left ventricular end-diastolic pressure.  Recommendations: This is a difficult situation overall.  This is the second time for in-stent restenosis in the LAD in spite of using 2 different drug-eluting stents.  The vessel was stented just 3 months ago.  I do not think we have good options for getting good long-term results with stents or repeat balloon angioplasty.  I am going to obtain a surgical opinion for off-pump LIMA to LAD.  The patient unfortunately started having chest pain during cardiac catheterization with significantly elevated blood pressure.  I am going to admit the patient to telemetry.  She has been on Plavix which I am going to discontinue in  preparation for surgery.  Start heparin drip 8 hours after sheath pull without bolus.     Patient Profile     47 y.o. female status post LAD DES x2 with in-stent restenosis.  She was seen by Dr. Cyndia Bent who agrees with off-pump LIMA to the LAD on Monday.  She remains asymptomatic on IV heparin.  Assessment & Plan    1: Coronary artery disease- history of CAD status post LAD DES x2 with in-stent restenosis scheduled for off-pump LIMA to the LAD on Monday.  2: Hyperlipidemia- on Crestor  3: Essential hypertension- controlled on current medications  For questions or updates, please contact [place] Please consult [email or web address] for contact info under        Signed, Quay Burow, MD  08/25/2018, 11:26 AM

## 2018-08-26 ENCOUNTER — Inpatient Hospital Stay (HOSPITAL_COMMUNITY)

## 2018-08-26 LAB — COMPREHENSIVE METABOLIC PANEL
ALT: 38 U/L (ref 0–44)
AST: 35 U/L (ref 15–41)
Albumin: 3.9 g/dL (ref 3.5–5.0)
Alkaline Phosphatase: 65 U/L (ref 38–126)
Anion gap: 8 (ref 5–15)
BUN: 14 mg/dL (ref 6–20)
CHLORIDE: 102 mmol/L (ref 98–111)
CO2: 29 mmol/L (ref 22–32)
CREATININE: 0.98 mg/dL (ref 0.44–1.00)
Calcium: 10 mg/dL (ref 8.9–10.3)
GFR calc Af Amer: >60 mL/min (ref >60)
Glucose, Bld: 117 mg/dL — ABNORMAL HIGH (ref 70–99)
Potassium: 3.5 mmol/L (ref 3.5–5.1)
Sodium: 139 mmol/L (ref 135–145)
TOTAL PROTEIN: 6.9 g/dL (ref 6.5–8.1)
Total Bilirubin: 0.6 mg/dL (ref 0.3–1.2)

## 2018-08-26 LAB — ABO/RH: ABO/RH(D): O POS

## 2018-08-26 LAB — CBC
HEMATOCRIT: 43 % (ref 36.0–46.0)
Hemoglobin: 14 g/dL (ref 12.0–15.0)
MCH: 28.2 pg (ref 26.0–34.0)
MCHC: 32.6 g/dL (ref 30.0–36.0)
MCV: 86.7 fL (ref 78.0–100.0)
Platelets: 143 10*3/uL — ABNORMAL LOW (ref 150–400)
RBC: 4.96 MIL/uL (ref 3.87–5.11)
RDW: 13 % (ref 11.5–15.5)
WBC: 6.4 10*3/uL (ref 4.0–10.5)

## 2018-08-26 LAB — TYPE AND SCREEN
ABO/RH(D): O POS
Antibody Screen: NEGATIVE

## 2018-08-26 LAB — SURGICAL PCR SCREEN
MRSA, PCR: NEGATIVE
Staphylococcus aureus: NEGATIVE

## 2018-08-26 LAB — HEMOGLOBIN A1C
HEMOGLOBIN A1C: 5.5 % (ref 4.8–5.6)
Mean Plasma Glucose: 111.15 mg/dL

## 2018-08-26 LAB — HEPARIN LEVEL (UNFRACTIONATED): Heparin Unfractionated: 0.38 IU/mL (ref 0.30–0.70)

## 2018-08-26 LAB — APTT: APTT: 63 s — AB (ref 24–36)

## 2018-08-26 MED ORDER — PLASMA-LYTE 148 IV SOLN
INTRAVENOUS | Status: DC
  Filled 2018-08-26: qty 2.5

## 2018-08-26 MED ORDER — VANCOMYCIN HCL 10 G IV SOLR
1250.0000 mg | INTRAVENOUS | Status: AC
  Administered 2018-08-27: 1250 mg via INTRAVENOUS
  Filled 2018-08-26: qty 1250

## 2018-08-26 MED ORDER — MAGNESIUM SULFATE 50 % IJ SOLN
40.0000 meq | INTRAMUSCULAR | Status: DC
  Filled 2018-08-26: qty 9.85

## 2018-08-26 MED ORDER — CHLORHEXIDINE GLUCONATE 0.12 % MT SOLN
15.0000 mL | Freq: Once | OROMUCOSAL | Status: AC
  Administered 2018-08-27: 15 mL via OROMUCOSAL
  Filled 2018-08-26: qty 15

## 2018-08-26 MED ORDER — MILRINONE LACTATE IN DEXTROSE 20-5 MG/100ML-% IV SOLN
0.3000 ug/kg/min | INTRAVENOUS | Status: DC
  Filled 2018-08-26: qty 100

## 2018-08-26 MED ORDER — NITROGLYCERIN IN D5W 200-5 MCG/ML-% IV SOLN
2.0000 ug/min | INTRAVENOUS | Status: DC
  Filled 2018-08-26: qty 250

## 2018-08-26 MED ORDER — CHLORHEXIDINE GLUCONATE CLOTH 2 % EX PADS
6.0000 | MEDICATED_PAD | Freq: Once | CUTANEOUS | Status: AC
  Administered 2018-08-26: 6 via TOPICAL

## 2018-08-26 MED ORDER — METOPROLOL TARTRATE 12.5 MG HALF TABLET
12.5000 mg | ORAL_TABLET | Freq: Once | ORAL | Status: AC
  Administered 2018-08-27: 12.5 mg via ORAL
  Filled 2018-08-26: qty 1

## 2018-08-26 MED ORDER — SODIUM CHLORIDE 0.9 % IV SOLN
750.0000 mg | INTRAVENOUS | Status: DC
  Filled 2018-08-26: qty 750

## 2018-08-26 MED ORDER — DIAZEPAM 5 MG PO TABS
5.0000 mg | ORAL_TABLET | Freq: Once | ORAL | Status: AC
  Administered 2018-08-27: 5 mg via ORAL
  Filled 2018-08-26: qty 1

## 2018-08-26 MED ORDER — DOPAMINE-DEXTROSE 3.2-5 MG/ML-% IV SOLN
0.0000 ug/kg/min | INTRAVENOUS | Status: DC
  Filled 2018-08-26: qty 250

## 2018-08-26 MED ORDER — POTASSIUM CHLORIDE 2 MEQ/ML IV SOLN
80.0000 meq | INTRAVENOUS | Status: DC
  Filled 2018-08-26: qty 40

## 2018-08-26 MED ORDER — SODIUM CHLORIDE 0.9 % IV SOLN
INTRAVENOUS | Status: DC
  Filled 2018-08-26: qty 30

## 2018-08-26 MED ORDER — PHENYLEPHRINE HCL-NACL 20-0.9 MG/250ML-% IV SOLN
30.0000 ug/min | INTRAVENOUS | Status: DC
  Filled 2018-08-26: qty 250

## 2018-08-26 MED ORDER — CHLORHEXIDINE GLUCONATE CLOTH 2 % EX PADS: 6.0000 | MEDICATED_PAD | Freq: Once | CUTANEOUS | Status: AC

## 2018-08-26 MED ORDER — BISACODYL 5 MG PO TBEC: 5.0000 mg | DELAYED_RELEASE_TABLET | Freq: Once | ORAL | Status: DC

## 2018-08-26 MED ORDER — TRANEXAMIC ACID 1000 MG/10ML IV SOLN
1.5000 mg/kg/h | INTRAVENOUS | Status: AC
  Administered 2018-08-27: 1.5 mg/kg/h via INTRAVENOUS
  Filled 2018-08-26: qty 25

## 2018-08-26 MED ORDER — SODIUM CHLORIDE 0.9 % IV SOLN
1.5000 g | INTRAVENOUS | Status: AC
  Administered 2018-08-27: .75 g via INTRAVENOUS
  Administered 2018-08-27: 1.5 g via INTRAVENOUS
  Filled 2018-08-26: qty 1.5

## 2018-08-26 MED ORDER — EPINEPHRINE PF 1 MG/ML IJ SOLN
0.0000 ug/min | INTRAVENOUS | Status: DC
  Filled 2018-08-26: qty 4

## 2018-08-26 MED ORDER — SODIUM CHLORIDE 0.9 % IV SOLN
INTRAVENOUS | Status: AC
  Administered 2018-08-27: .7 [IU]/h via INTRAVENOUS
  Filled 2018-08-26: qty 1

## 2018-08-26 MED ORDER — DEXMEDETOMIDINE HCL IN NACL 400 MCG/100ML IV SOLN
0.1000 ug/kg/h | INTRAVENOUS | Status: AC
  Administered 2018-08-27: 0.7 ug/kg/h via INTRAVENOUS
  Filled 2018-08-26: qty 100

## 2018-08-26 MED ORDER — TRANEXAMIC ACID (OHS) PUMP PRIME SOLUTION
2.0000 mg/kg | INTRAVENOUS | Status: DC
  Filled 2018-08-26: qty 1.25

## 2018-08-26 MED ORDER — TRANEXAMIC ACID (OHS) BOLUS VIA INFUSION
15.0000 mg/kg | INTRAVENOUS | Status: AC
  Administered 2018-08-27: 936 mg via INTRAVENOUS
  Filled 2018-08-26 (×3): qty 936

## 2018-08-26 MED ORDER — TEMAZEPAM 7.5 MG PO CAPS
15.0000 mg | ORAL_CAPSULE | Freq: Once | ORAL | Status: AC | PRN
  Administered 2018-08-26: 15 mg via ORAL
  Filled 2018-08-26: qty 2

## 2018-08-26 NOTE — Progress Notes (Signed)
Pt has decided not to watch the educational video

## 2018-08-26 NOTE — Plan of Care (Signed)
  Problem: Health Behavior/Discharge Planning: Goal: Ability to safely manage health-related needs after discharge will improve Outcome: Progressing   Problem: Clinical Measurements: Goal: Diagnostic test results will improve Outcome: Progressing Goal: Cardiovascular complication will be avoided Outcome: Progressing   Problem: Elimination: Goal: Will not experience complications related to bowel motility Outcome: Progressing   Problem: Clinical Measurements: Goal: Respiratory complications will improve Outcome: Completed/Met

## 2018-08-26 NOTE — Progress Notes (Signed)
4 Days Post-Op Procedure(s) (LRB): LEFT HEART CATH AND CORONARY ANGIOGRAPHY (N/A) Subjective: No chest pain or shortness of breath.  Objective: Vital signs in last 24 hours: Temp:  [97.7 F (36.5 C)-98.5 F (36.9 C)] 98.5 F (36.9 C) (10/06 1038) Pulse Rate:  [61-67] 67 (10/06 1038) Cardiac Rhythm: Normal sinus rhythm (10/06 1038) Resp:  [14-26] 26 (10/06 1038) BP: (141-154)/(56-68) 141/56 (10/06 1038) SpO2:  [96 %-98 %] 98 % (10/06 1038) Weight:  [62.4 kg] 62.4 kg (10/06 0548)  Hemodynamic parameters for last 24 hours:    Intake/Output from previous day: 10/05 0701 - 10/06 0700 In: 171.3 [I.V.:171.3] Out: -  Intake/Output this shift: No intake/output data recorded.  General appearance: alert and cooperative Heart: regular rate and rhythm, S1, S2 normal, no murmur, click, rub or gallop Lungs: clear to auscultation bilaterally  Lab Results: Recent Labs    08/25/18 0317 08/26/18 0442  WBC 7.5 6.4  HGB 13.5 14.0  HCT 42.1 43.0  PLT 136* 143*   BMET: No results for input(s): NA, K, CL, CO2, GLUCOSE, BUN, CREATININE, CALCIUM in the last 72 hours.  PT/INR: No results for input(s): LABPROT, INR in the last 72 hours. ABG No results found for: PHART, HCO3, TCO2, ACIDBASEDEF, O2SAT CBG (last 3)  No results for input(s): GLUCAP in the last 72 hours.  Assessment/Plan:  High grade LAD in-stent restenosis. Ready for CABG tomorrow. Her and husband have no further questions.  LOS: 4 days    Gaye Pollack 08/26/2018

## 2018-08-26 NOTE — Progress Notes (Signed)
Progress Note  Patient Name: Kim Villarreal Date of Encounter: 08/26/2018  Primary Cardiologist: Dr. Fletcher Anon  Subjective   47 year old female with DES to LAD x2 with in-stent restenosis scheduled for off-pump LIMA to the LAD by Dr. Cyndia Bent on Monday.  She is pain-free on IV heparin.  Inpatient Medications    Scheduled Meds: . aspirin EC  81 mg Oral Daily  . cholecalciferol  5,000 Units Oral Daily  . losartan  50 mg Oral Daily  . rosuvastatin  5 mg Oral Daily  . sodium chloride flush  3 mL Intravenous Q12H   Continuous Infusions: . sodium chloride    . heparin 900 Units/hr (08/25/18 0525)   PRN Meds: sodium chloride, acetaminophen, ALPRAZolam, nitroGLYCERIN, ondansetron (ZOFRAN) IV, sodium chloride flush   Vital Signs    Vitals:   08/25/18 1945 08/26/18 0548 08/26/18 0745 08/26/18 1038  BP: (!) 154/68 (!) 141/67  (!) 141/56  Pulse: 61   67  Resp: 16 14 19  (!) 26  Temp: 97.7 F (36.5 C) 98.2 F (36.8 C)  98.5 F (36.9 C)  TempSrc: Oral Oral  Oral  SpO2:  96%  98%  Weight:  62.4 kg    Height:        Intake/Output Summary (Last 24 hours) at 08/26/2018 1121 Last data filed at 08/26/2018 0119 Gross per 24 hour  Intake 171.3 ml  Output -  Net 171.3 ml   Filed Weights   08/22/18 1110 08/22/18 1516 08/26/18 0548  Weight: 62.6 kg 62.5 kg 62.4 kg    Telemetry    Sinus rhythm- Personally Reviewed  ECG    Not performed today- Personally Reviewed  Physical Exam   GEN: No acute distress.   Neck: No JVD Cardiac: RRR, no murmurs, rubs, or gallops.  Respiratory: Clear to auscultation bilaterally. GI: Soft, nontender, non-distended  MS: No edema; No deformity. Neuro:  Nonfocal  Psych: Normal affect   Labs    ChemistryNo results for input(s): NA, K, CL, CO2, GLUCOSE, BUN, CREATININE, CALCIUM, PROT, ALBUMIN, AST, ALT, ALKPHOS, BILITOT, GFRNONAA, GFRAA, ANIONGAP in the last 168 hours.   Hematology Recent Labs  Lab 08/24/18 0407 08/25/18 0317 08/26/18 0442   WBC 7.1 7.5 6.4  RBC 4.64 4.81 4.96  HGB 13.1 13.5 14.0  HCT 41.2 42.1 43.0  MCV 88.8 87.5 86.7  MCH 28.2 28.1 28.2  MCHC 31.8 32.1 32.6  RDW 13.4 13.0 13.0  PLT 120* 136* 143*    Cardiac EnzymesNo results for input(s): TROPONINI in the last 168 hours. No results for input(s): TROPIPOC in the last 168 hours.   BNPNo results for input(s): BNP, PROBNP in the last 168 hours.   DDimer No results for input(s): DDIMER in the last 168 hours.   Radiology    No results found.  Cardiac Studies   2D echocardiogram (08/23/2018)  Study Conclusions  - Left ventricle: The cavity size was normal. Wall thickness was   normal. Systolic function was normal. The estimated ejection   fraction was in the range of 60% to 65%. - Left atrium: The atrium was mildly dilated.  Cardiac catheterization (08/22/2018)  Conclusion     Prox LAD to Mid LAD lesion is 95% stenosed.  The left ventricular systolic function is normal.  LV end diastolic pressure is normal.  The left ventricular ejection fraction is greater than 65% by visual estimate.   1.  Severe one-vessel coronary artery disease due to recurrent in-stent restenosis in the mid LAD.  No other obstructive  disease. 2.  Normal LV systolic function and normal left ventricular end-diastolic pressure.  Recommendations: This is a difficult situation overall.  This is the second time for in-stent restenosis in the LAD in spite of using 2 different drug-eluting stents.  The vessel was stented just 3 months ago.  I do not think we have good options for getting good long-term results with stents or repeat balloon angioplasty.  I am going to obtain a surgical opinion for off-pump LIMA to LAD.  The patient unfortunately started having chest pain during cardiac catheterization with significantly elevated blood pressure.  I am going to admit the patient to telemetry.  She has been on Plavix which I am going to discontinue in preparation for surgery.   Start heparin drip 8 hours after sheath pull without bolus.     Patient Profile     47 y.o. female status post LAD DES x2 with in-stent restenosis.  She was seen by Dr. Cyndia Bent who agrees with off-pump LIMA to the LAD on Monday.  She remains asymptomatic on IV heparin.  Assessment & Plan    1: Coronary artery disease- history of CAD status post LAD DES x2 with in-stent restenosis scheduled for off-pump LIMA to the LAD on Monday.  2: Hyperlipidemia- on Crestor  3: Essential hypertension- controlled on current medications  For questions or updates, please contact [place] Please consult [email or web address] for contact info under        Signed, Quay Burow, MD  08/26/2018, 11:21 AM

## 2018-08-26 NOTE — Progress Notes (Signed)
Patient refused pre-op arterial blood gas.

## 2018-08-26 NOTE — Anesthesia Preprocedure Evaluation (Addendum)
Anesthesia Evaluation  Patient identified by MRN, date of birth, ID band Patient awake    Reviewed: Allergy & Precautions, NPO status , Patient's Chart, lab work & pertinent test results  Airway Mallampati: II  TM Distance: >3 FB Neck ROM: Full    Dental  (+) Teeth Intact, Dental Advisory Given   Pulmonary asthma , former smoker,    Pulmonary exam normal breath sounds clear to auscultation       Cardiovascular hypertension, Pt. on medications + angina + CAD and + Cardiac Stents (DES to LAD x2, in-stent restenosis)  Normal cardiovascular exam+ Valvular Problems/Murmurs MR  Rhythm:Regular Rate:Normal  Echo 08/23/18: Study Conclusions  - Left ventricle: The cavity size was normal. Wall thickness was normal. Systolic function was normal. The estimated ejection fraction was in the range of 60% to 65%. - Left atrium: The atrium was mildly dilated.  [facility] 08/22/18: Prox LAD to Mid LAD lesion is 95% stenosed. The left ventricular systolic function is normal. LV end diastolic pressure is normal. The left ventricular ejection fraction is greater than 65% by visual estimate.  1. Severe one-vessel coronary artery disease due to recurrent in-stent restenosis in the mid LAD. No other obstructive disease. 2. Normal LV systolic function and normal left ventricular end-diastolic pressure.   Neuro/Psych  Headaches, PSYCHIATRIC DISORDERS Anxiety    GI/Hepatic negative GI ROS, Neg liver ROS,   Endo/Other  negative endocrine ROS  Renal/GU negative Renal ROS     Musculoskeletal negative musculoskeletal ROS (+)   Abdominal   Peds  Hematology  (+) Blood dyscrasia (Plavix), , Thrombocytopenia    Anesthesia Other Findings Day of surgery medications reviewed with the patient.  Reproductive/Obstetrics                            Anesthesia Physical Anesthesia Plan  ASA: IV  Anesthesia Plan: General    Post-op Pain Management:    Induction: Intravenous  PONV Risk Score and Plan: 3 and Treatment may vary due to age or medical condition, Midazolam and Propofol infusion  Airway Management Planned: Oral ETT  Additional Equipment: Ultrasound Guidance Line Placement, TEE, PA Cath, CVP and Arterial line  Intra-op Plan:   Post-operative Plan: Post-operative intubation/ventilation  Informed Consent: I have reviewed the patients History and Physical, chart, labs and discussed the procedure including the risks, benefits and alternatives for the proposed anesthesia with the patient or authorized representative who has indicated his/her understanding and acceptance.   Dental advisory given  Plan Discussed with: CRNA  Anesthesia Plan Comments:        Anesthesia Quick Evaluation

## 2018-08-26 NOTE — Progress Notes (Signed)
ANTICOAGULATION CONSULT NOTE - Follow Up Consult  Pharmacy Consult for Heparin Indication: chest pain/ACS, CAD, for CABG on 10/7  Allergies  Allergen Reactions  . Codeine Nausea And Vomiting  . Contrast Media [Iodinated Diagnostic Agents] Swelling    The patient had mild lip swelling at the end of the case without rash or respiratory distress. This might be due to dye reaction. 2017    Patient Measurements: Height: 5' 6.5" (168.9 cm) Weight: 137 lb 8 oz (62.4 kg) IBW/kg (Calculated) : 60.45 Heparin Dosing Weight: 62 kg  Vital Signs: Temp: 98.2 F (36.8 C) (10/06 0548) Temp Source: Oral (10/06 0548) BP: 141/67 (10/06 0548)  Labs: Recent Labs    08/24/18 0407 08/25/18 0317 08/26/18 0442  HGB 13.1 13.5 14.0  HCT 41.2 42.1 43.0  PLT 120* 136* 143*  HEPARINUNFRC 0.51 0.45 0.38    Estimated Creatinine Clearance: 73 mL/min (by C-G formula based on SCr of 0.92 mg/dL).  Assessment: 39 yoF admitted for LHC on 10/2 found to have in-stent restenosis. Planning CABG on 10/7 after Plavix washout.  Last Plavix dose 10/2.  Heparin level therapeutic, Hgb wnl, pltc low but stable. CABG planned for tomorrow.   Goal of Therapy:  Heparin level 0.3-0.7 units/ml Monitor platelets by anticoagulation protocol: Yes   Plan:  -Continue heparin drip at 900 units/hr - stop on call to OR -Daily heparin level and CBC while on heparin  Arrie Senate, PharmD, BCPS Clinical Pharmacist [phone] Please check AMION for all [place] numbers 08/26/2018

## 2018-08-27 ENCOUNTER — Inpatient Hospital Stay (HOSPITAL_COMMUNITY)
Admission: AD | Disposition: A | Discharge status: Home / Self Care | Payer: Self-pay | Source: Ambulatory Visit | Attending: Cardiovascular Disease

## 2018-08-27 ENCOUNTER — Inpatient Hospital Stay (HOSPITAL_COMMUNITY): Admitting: Anesthesiology

## 2018-08-27 ENCOUNTER — Inpatient Hospital Stay (HOSPITAL_COMMUNITY)

## 2018-08-27 ENCOUNTER — Encounter (HOSPITAL_COMMUNITY): Payer: Self-pay | Admitting: Certified Registered"

## 2018-08-27 DIAGNOSIS — Z951 Presence of aortocoronary bypass graft: Secondary | ICD-10-CM

## 2018-08-27 HISTORY — PX: TEE WITHOUT CARDIOVERSION: SHX5443

## 2018-08-27 HISTORY — PX: CORONARY ARTERY BYPASS GRAFT: SHX141

## 2018-08-27 LAB — POCT I-STAT 3, ART BLOOD GAS (G3+)
ACID-BASE DEFICIT: 2 mmol/L (ref 0.0–2.0)
ACID-BASE DEFICIT: 5 mmol/L — AB (ref 0.0–2.0)
ACID-BASE EXCESS: 4 mmol/L — AB (ref 0.0–2.0)
ACID-BASE EXCESS: 5 mmol/L — AB (ref 0.0–2.0)
Acid-Base Excess: 4 mmol/L — ABNORMAL HIGH (ref 0.0–2.0)
BICARBONATE: 27.6 mmol/L (ref 20.0–28.0)
Bicarbonate: 28.8 mmol/L — ABNORMAL HIGH (ref 20.0–28.0)
Bicarbonate: 26.9 mmol/L (ref 20.0–28.0)
Bicarbonate: 23.2 mmol/L (ref 20.0–28.0)
Bicarbonate: 21.7 mmol/L (ref 20.0–28.0)
Bicarbonate: 20.2 mmol/L (ref 20.0–28.0)
O2 SAT: 100 %
O2 SAT: 100 %
O2 SAT: 100 %
O2 SAT: 100 %
O2 Saturation: 100 %
O2 Saturation: 100 %
PCO2 ART: 42.7 mmHg (ref 32.0–48.0)
PCO2 ART: 33.9 mmHg (ref 32.0–48.0)
PCO2 ART: 32 mmHg (ref 32.0–48.0)
PCO2 ART: 30.4 mmHg — AB (ref 32.0–48.0)
PCO2 ART: 31.7 mmHg — AB (ref 32.0–48.0)
PCO2 ART: 37.9 mmHg (ref 32.0–48.0)
PH ART: 7.486 — AB (ref 7.350–7.450)
PO2 ART: 466 mmHg — AB (ref 83.0–108.0)
PO2 ART: 428 mmHg — AB (ref 83.0–108.0)
PO2 ART: 437 mmHg — AB (ref 83.0–108.0)
Patient temperature: 35.8
Patient temperature: 37.2
TCO2: 30 mmol/L (ref 22–32)
TCO2: 28 mmol/L (ref 22–32)
TCO2: 29 mmol/L (ref 22–32)
TCO2: 24 mmol/L (ref 22–32)
TCO2: 23 mmol/L (ref 22–32)
TCO2: 21 mmol/L — AB (ref 22–32)
pH, Arterial: 7.436 (ref 7.350–7.450)
pH, Arterial: 7.508 — ABNORMAL HIGH (ref 7.350–7.450)
pH, Arterial: 7.543 — ABNORMAL HIGH (ref 7.350–7.450)
pH, Arterial: 7.445 (ref 7.350–7.450)
pH, Arterial: 7.336 — ABNORMAL LOW (ref 7.350–7.450)
pO2, Arterial: 148 mmHg — ABNORMAL HIGH (ref 83.0–108.0)
pO2, Arterial: 187 mmHg — ABNORMAL HIGH (ref 83.0–108.0)
pO2, Arterial: 226 mmHg — ABNORMAL HIGH (ref 83.0–108.0)

## 2018-08-27 LAB — CBC
HCT: 43.1 % (ref 36.0–46.0)
HCT: 32.2 % — ABNORMAL LOW (ref 36.0–46.0)
HEMATOCRIT: 29.4 % — AB (ref 36.0–46.0)
HEMOGLOBIN: 9.6 g/dL — AB (ref 12.0–15.0)
Hemoglobin: 13.8 g/dL (ref 12.0–15.0)
Hemoglobin: 10.4 g/dL — ABNORMAL LOW (ref 12.0–15.0)
MCH: 27.9 pg (ref 26.0–34.0)
MCH: 28.2 pg (ref 26.0–34.0)
MCH: 28.4 pg (ref 26.0–34.0)
MCHC: 32 g/dL (ref 30.0–36.0)
MCHC: 32.7 g/dL (ref 30.0–36.0)
MCHC: 32.3 g/dL (ref 30.0–36.0)
MCV: 87.1 fL (ref 78.0–100.0)
MCV: 86.5 fL (ref 78.0–100.0)
MCV: 88 fL (ref 78.0–100.0)
PLATELETS: 112 10*3/uL — AB (ref 150–400)
Platelets: 150 10*3/uL (ref 150–400)
Platelets: 86 10*3/uL — ABNORMAL LOW (ref 150–400)
RBC: 4.95 MIL/uL (ref 3.87–5.11)
RBC: 3.4 MIL/uL — AB (ref 3.87–5.11)
RBC: 3.66 MIL/uL — AB (ref 3.87–5.11)
RDW: 13.1 % (ref 11.5–15.5)
RDW: 12.8 % (ref 11.5–15.5)
RDW: 13.1 % (ref 11.5–15.5)
WBC: 7.9 10*3/uL (ref 4.0–10.5)
WBC: 6.8 10*3/uL (ref 4.0–10.5)
WBC: 13.4 10*3/uL — AB (ref 4.0–10.5)

## 2018-08-27 LAB — POCT I-STAT 4, (NA,K, GLUC, HGB,HCT)
GLUCOSE: 131 mg/dL — AB (ref 70–99)
HEMATOCRIT: 25 % — AB (ref 36.0–46.0)
HEMOGLOBIN: 8.5 g/dL — AB (ref 12.0–15.0)
POTASSIUM: 3.4 mmol/L — AB (ref 3.5–5.1)
Sodium: 142 mmol/L (ref 135–145)

## 2018-08-27 LAB — BASIC METABOLIC PANEL
ANION GAP: 9 (ref 5–15)
BUN: 11 mg/dL (ref 6–20)
CALCIUM: 9.8 mg/dL (ref 8.9–10.3)
CO2: 27 mmol/L (ref 22–32)
CREATININE: 0.87 mg/dL (ref 0.44–1.00)
Chloride: 105 mmol/L (ref 98–111)
GFR calc Af Amer: >60 mL/min (ref >60)
GFR calc non Af Amer: >60 mL/min (ref >60)
Glucose, Bld: 104 mg/dL — ABNORMAL HIGH (ref 70–99)
Potassium: 3.7 mmol/L (ref 3.5–5.1)
Sodium: 141 mmol/L (ref 135–145)

## 2018-08-27 LAB — POCT I-STAT, CHEM 8
BUN: 11 mg/dL (ref 6–20)
BUN: 9 mg/dL (ref 6–20)
BUN: 8 mg/dL (ref 6–20)
BUN: 8 mg/dL (ref 6–20)
BUN: 8 mg/dL (ref 6–20)
CALCIUM ION: 1.25 mmol/L (ref 1.15–1.40)
CALCIUM ION: 1.04 mmol/L — AB (ref 1.15–1.40)
CHLORIDE: 106 mmol/L (ref 98–111)
CHLORIDE: 99 mmol/L (ref 98–111)
CREATININE: 0.5 mg/dL (ref 0.44–1.00)
CREATININE: 0.6 mg/dL (ref 0.44–1.00)
CREATININE: 0.7 mg/dL (ref 0.44–1.00)
Calcium, Ion: 1.3 mmol/L (ref 1.15–1.40)
Calcium, Ion: 1.13 mmol/L — ABNORMAL LOW (ref 1.15–1.40)
Calcium, Ion: 1.21 mmol/L (ref 1.15–1.40)
Chloride: 104 mmol/L (ref 98–111)
Chloride: 103 mmol/L (ref 98–111)
Chloride: 108 mmol/L (ref 98–111)
Creatinine, Ser: 0.7 mg/dL (ref 0.44–1.00)
Creatinine, Ser: 0.5 mg/dL (ref 0.44–1.00)
Glucose, Bld: 97 mg/dL (ref 70–99)
Glucose, Bld: 119 mg/dL — ABNORMAL HIGH (ref 70–99)
Glucose, Bld: 111 mg/dL — ABNORMAL HIGH (ref 70–99)
Glucose, Bld: 121 mg/dL — ABNORMAL HIGH (ref 70–99)
Glucose, Bld: 123 mg/dL — ABNORMAL HIGH (ref 70–99)
HCT: 24 % — ABNORMAL LOW (ref 36.0–46.0)
HEMATOCRIT: 37 % (ref 36.0–46.0)
HEMATOCRIT: 31 % — AB (ref 36.0–46.0)
HEMATOCRIT: 24 % — AB (ref 36.0–46.0)
HEMATOCRIT: 31 % — AB (ref 36.0–46.0)
HEMOGLOBIN: 8.2 g/dL — AB (ref 12.0–15.0)
Hemoglobin: 12.6 g/dL (ref 12.0–15.0)
Hemoglobin: 10.5 g/dL — ABNORMAL LOW (ref 12.0–15.0)
Hemoglobin: 8.2 g/dL — ABNORMAL LOW (ref 12.0–15.0)
Hemoglobin: 10.5 g/dL — ABNORMAL LOW (ref 12.0–15.0)
POTASSIUM: 3.8 mmol/L (ref 3.5–5.1)
Potassium: 3.8 mmol/L (ref 3.5–5.1)
Potassium: 3.5 mmol/L (ref 3.5–5.1)
Potassium: 4.1 mmol/L (ref 3.5–5.1)
Potassium: 4.4 mmol/L (ref 3.5–5.1)
SODIUM: 141 mmol/L (ref 135–145)
SODIUM: 139 mmol/L (ref 135–145)
SODIUM: 139 mmol/L (ref 135–145)
Sodium: 136 mmol/L (ref 135–145)
Sodium: 138 mmol/L (ref 135–145)
TCO2: 31 mmol/L (ref 22–32)
TCO2: 28 mmol/L (ref 22–32)
TCO2: 28 mmol/L (ref 22–32)
TCO2: 29 mmol/L (ref 22–32)
TCO2: 24 mmol/L (ref 22–32)

## 2018-08-27 LAB — PLATELET COUNT: PLATELETS: 125 10*3/uL — AB (ref 150–400)

## 2018-08-27 LAB — GLUCOSE, CAPILLARY
GLUCOSE-CAPILLARY: 131 mg/dL — AB (ref 70–99)
GLUCOSE-CAPILLARY: 128 mg/dL — AB (ref 70–99)
GLUCOSE-CAPILLARY: 105 mg/dL — AB (ref 70–99)
GLUCOSE-CAPILLARY: 133 mg/dL — AB (ref 70–99)
GLUCOSE-CAPILLARY: 103 mg/dL — AB (ref 70–99)
Glucose-Capillary: 101 mg/dL — ABNORMAL HIGH (ref 70–99)
Glucose-Capillary: 128 mg/dL — ABNORMAL HIGH (ref 70–99)
Glucose-Capillary: 113 mg/dL — ABNORMAL HIGH (ref 70–99)
Glucose-Capillary: 107 mg/dL — ABNORMAL HIGH (ref 70–99)

## 2018-08-27 LAB — HEPARIN LEVEL (UNFRACTIONATED): Heparin Unfractionated: 0.39 IU/mL (ref 0.30–0.70)

## 2018-08-27 LAB — MAGNESIUM: MAGNESIUM: 3.4 mg/dL — AB (ref 1.7–2.4)

## 2018-08-27 LAB — PROTIME-INR
INR: 1.49
PROTHROMBIN TIME: 17.9 s — AB (ref 11.4–15.2)

## 2018-08-27 LAB — HEMOGLOBIN AND HEMATOCRIT, BLOOD
HCT: 26.4 % — ABNORMAL LOW (ref 36.0–46.0)
Hemoglobin: 8.7 g/dL — ABNORMAL LOW (ref 12.0–15.0)

## 2018-08-27 LAB — CREATININE, SERUM: CREATININE: 0.75 mg/dL (ref 0.44–1.00)

## 2018-08-27 LAB — APTT: aPTT: 33 seconds (ref 24–36)

## 2018-08-27 SURGERY — OFF PUMP CORONARY ARTERY BYPASS GRAFTING (CABG)
Anesthesia: General | Site: Chest

## 2018-08-27 MED ORDER — SODIUM CHLORIDE 0.45 % IV SOLN
INTRAVENOUS | Status: DC | PRN
  Administered 2018-08-27: 12:00:00 via INTRAVENOUS

## 2018-08-27 MED ORDER — MORPHINE SULFATE (PF) 2 MG/ML IV SOLN
2.0000 mg | INTRAVENOUS | Status: DC | PRN
  Administered 2018-08-27: 4 mg via INTRAVENOUS
  Administered 2018-08-27: 2 mg via INTRAVENOUS
  Administered 2018-08-27: 4 mg via INTRAVENOUS
  Administered 2018-08-28: 2 mg via INTRAVENOUS
  Administered 2018-08-28 (×4): 4 mg via INTRAVENOUS
  Filled 2018-08-27: qty 1
  Filled 2018-08-27 (×6): qty 2
  Filled 2018-08-27 (×2): qty 1

## 2018-08-27 MED ORDER — ROCURONIUM BROMIDE 50 MG/5ML IV SOSY
PREFILLED_SYRINGE | INTRAVENOUS | Status: DC | PRN
  Administered 2018-08-27 (×3): 50 mg via INTRAVENOUS

## 2018-08-27 MED ORDER — PROPOFOL 10 MG/ML IV BOLUS
INTRAVENOUS | Status: DC | PRN
  Administered 2018-08-27: 50 mg via INTRAVENOUS
  Administered 2018-08-27: 70 mg via INTRAVENOUS
  Administered 2018-08-27: 60 mg via INTRAVENOUS
  Administered 2018-08-27: 70 mg via INTRAVENOUS
  Administered 2018-08-27: 80 mg via INTRAVENOUS
  Administered 2018-08-27: 70 mg via INTRAVENOUS

## 2018-08-27 MED ORDER — PROTAMINE SULFATE 10 MG/ML IV SOLN
INTRAVENOUS | Status: DC | PRN
  Administered 2018-08-27: 30 mg via INTRAVENOUS
  Administered 2018-08-27: 110 mg via INTRAVENOUS
  Administered 2018-08-27: 80 mg via INTRAVENOUS

## 2018-08-27 MED ORDER — SODIUM CHLORIDE 0.9 % IV SOLN
INTRAVENOUS | Status: DC
  Administered 2018-08-27: 19:00:00 via INTRAVENOUS

## 2018-08-27 MED ORDER — PHENYLEPHRINE HCL-NACL 20-0.9 MG/250ML-% IV SOLN
0.0000 ug/min | INTRAVENOUS | Status: DC
  Filled 2018-08-27: qty 250

## 2018-08-27 MED ORDER — NITROGLYCERIN 0.4 MG SL SUBL
SUBLINGUAL_TABLET | SUBLINGUAL | Status: AC
  Filled 2018-08-27: qty 1

## 2018-08-27 MED ORDER — PROPOFOL 10 MG/ML IV BOLUS
INTRAVENOUS | Status: AC
  Filled 2018-08-27: qty 20

## 2018-08-27 MED ORDER — DOCUSATE SODIUM 100 MG PO CAPS
200.0000 mg | ORAL_CAPSULE | Freq: Every day | ORAL | Status: DC
  Administered 2018-08-28: 200 mg via ORAL
  Filled 2018-08-27: qty 2

## 2018-08-27 MED ORDER — ROCURONIUM BROMIDE 50 MG/5ML IV SOSY
PREFILLED_SYRINGE | INTRAVENOUS | Status: AC
  Filled 2018-08-27: qty 5

## 2018-08-27 MED ORDER — INSULIN ASPART 100 UNIT/ML ~~LOC~~ SOLN
0.0000 [IU] | SUBCUTANEOUS | Status: DC
  Administered 2018-08-27: 2 [IU] via SUBCUTANEOUS

## 2018-08-27 MED ORDER — THROMBIN 20000 UNITS EX KIT
PACK | CUTANEOUS | Status: AC
  Filled 2018-08-27: qty 1

## 2018-08-27 MED ORDER — METOCLOPRAMIDE HCL 5 MG/ML IJ SOLN
10.0000 mg | Freq: Four times a day (QID) | INTRAMUSCULAR | Status: AC
  Administered 2018-08-27 – 2018-08-28 (×4): 10 mg via INTRAVENOUS
  Filled 2018-08-27 (×3): qty 2

## 2018-08-27 MED ORDER — NITROGLYCERIN IN D5W 200-5 MCG/ML-% IV SOLN: 0.0000 ug/min | INTRAVENOUS | Status: DC

## 2018-08-27 MED ORDER — NOREPINEPHRINE BITARTRATE 1 MG/ML IV SOLN
INTRAVENOUS | Status: DC | PRN
  Administered 2018-08-27: 2 ug/min via INTRAVENOUS

## 2018-08-27 MED ORDER — MIDAZOLAM HCL 10 MG/2ML IJ SOLN
INTRAMUSCULAR | Status: AC
  Filled 2018-08-27: qty 2

## 2018-08-27 MED ORDER — ACETAMINOPHEN 650 MG RE SUPP
650.0000 mg | Freq: Once | RECTAL | Status: AC
  Administered 2018-08-27: 650 mg via RECTAL

## 2018-08-27 MED ORDER — THROMBIN 20000 UNITS EX SOLR
CUTANEOUS | Status: DC | PRN
  Administered 2018-08-27: 20000 [IU] via TOPICAL

## 2018-08-27 MED ORDER — ONDANSETRON HCL 4 MG/2ML IJ SOLN
INTRAMUSCULAR | Status: AC
  Filled 2018-08-27: qty 2

## 2018-08-27 MED ORDER — ALBUMIN HUMAN 5 % IV SOLN
250.0000 mL | INTRAVENOUS | Status: DC | PRN
  Administered 2018-08-27: 12.5 g via INTRAVENOUS

## 2018-08-27 MED ORDER — METOPROLOL TARTRATE 25 MG/10 ML ORAL SUSPENSION: 12.5000 mg | Freq: Two times a day (BID) | ORAL | Status: DC

## 2018-08-27 MED ORDER — LIDOCAINE 2% (20 MG/ML) 5 ML SYRINGE
INTRAMUSCULAR | Status: AC
  Filled 2018-08-27: qty 5

## 2018-08-27 MED ORDER — ONDANSETRON HCL 4 MG/2ML IJ SOLN
INTRAMUSCULAR | Status: DC | PRN
  Administered 2018-08-27: 4 mg via INTRAVENOUS

## 2018-08-27 MED ORDER — VANCOMYCIN HCL IN DEXTROSE 1-5 GM/200ML-% IV SOLN
1000.0000 mg | Freq: Once | INTRAVENOUS | Status: AC
  Administered 2018-08-27: 1000 mg via INTRAVENOUS
  Filled 2018-08-27: qty 200

## 2018-08-27 MED ORDER — MIDAZOLAM HCL 2 MG/2ML IJ SOLN: 2.0000 mg | INTRAMUSCULAR | Status: DC | PRN

## 2018-08-27 MED ORDER — CHLORHEXIDINE GLUCONATE CLOTH 2 % EX PADS
6.0000 | MEDICATED_PAD | Freq: Every day | CUTANEOUS | Status: DC
  Administered 2018-08-27: 6 via TOPICAL

## 2018-08-27 MED ORDER — LACTATED RINGERS IV SOLN
INTRAVENOUS | Status: DC | PRN
  Administered 2018-08-27: 08:00:00 via INTRAVENOUS

## 2018-08-27 MED ORDER — POTASSIUM CHLORIDE 10 MEQ/50ML IV SOLN
10.0000 meq | INTRAVENOUS | Status: AC
  Administered 2018-08-27 (×3): 10 meq via INTRAVENOUS

## 2018-08-27 MED ORDER — METOPROLOL TARTRATE 12.5 MG HALF TABLET
12.5000 mg | ORAL_TABLET | Freq: Two times a day (BID) | ORAL | Status: DC
  Administered 2018-08-28 (×2): 12.5 mg via ORAL
  Filled 2018-08-27 (×2): qty 1

## 2018-08-27 MED ORDER — LACTATED RINGERS IV SOLN: INTRAVENOUS | Status: DC

## 2018-08-27 MED ORDER — TRAMADOL HCL 50 MG PO TABS: 50.0000 mg | ORAL_TABLET | ORAL | Status: DC | PRN

## 2018-08-27 MED ORDER — FENTANYL CITRATE (PF) 250 MCG/5ML IJ SOLN
INTRAMUSCULAR | Status: AC
  Filled 2018-08-27: qty 5

## 2018-08-27 MED ORDER — SODIUM CHLORIDE 0.9 % IV SOLN: 250.0000 mL | INTRAVENOUS | Status: DC

## 2018-08-27 MED ORDER — ONDANSETRON HCL 4 MG/2ML IJ SOLN
4.0000 mg | Freq: Four times a day (QID) | INTRAMUSCULAR | Status: DC | PRN
  Administered 2018-08-28 (×2): 4 mg via INTRAVENOUS
  Filled 2018-08-27 (×3): qty 2

## 2018-08-27 MED ORDER — FENTANYL CITRATE (PF) 250 MCG/5ML IJ SOLN
INTRAMUSCULAR | Status: DC | PRN
  Administered 2018-08-27: 250 ug via INTRAVENOUS
  Administered 2018-08-27: 150 ug via INTRAVENOUS
  Administered 2018-08-27: 100 ug via INTRAVENOUS
  Administered 2018-08-27 (×2): 50 ug via INTRAVENOUS
  Administered 2018-08-27: 100 ug via INTRAVENOUS
  Administered 2018-08-27: 150 ug via INTRAVENOUS
  Administered 2018-08-27: 250 ug via INTRAVENOUS

## 2018-08-27 MED ORDER — LIDOCAINE 2% (20 MG/ML) 5 ML SYRINGE
INTRAMUSCULAR | Status: DC | PRN
  Administered 2018-08-27: 60 mg via INTRAVENOUS

## 2018-08-27 MED ORDER — ORAL CARE MOUTH RINSE
15.0000 mL | Freq: Two times a day (BID) | OROMUCOSAL | Status: DC
  Administered 2018-08-27 – 2018-08-29 (×4): 15 mL via OROMUCOSAL

## 2018-08-27 MED ORDER — ACETAMINOPHEN 160 MG/5ML PO SOLN
1000.0000 mg | Freq: Four times a day (QID) | ORAL | Status: DC
  Administered 2018-08-28: 1000 mg
  Filled 2018-08-27: qty 40.6

## 2018-08-27 MED ORDER — HEMOSTATIC AGENTS (NO CHARGE) OPTIME
TOPICAL | Status: DC | PRN
  Administered 2018-08-27: 1 via TOPICAL

## 2018-08-27 MED ORDER — HEPARIN SODIUM (PORCINE) 1000 UNIT/ML IJ SOLN
INTRAMUSCULAR | Status: AC
  Filled 2018-08-27: qty 2

## 2018-08-27 MED ORDER — PHENYLEPHRINE 40 MCG/ML (10ML) SYRINGE FOR IV PUSH (FOR BLOOD PRESSURE SUPPORT)
PREFILLED_SYRINGE | INTRAVENOUS | Status: DC | PRN
  Administered 2018-08-27: 20 ug via INTRAVENOUS
  Administered 2018-08-27: 10 ug via INTRAVENOUS
  Administered 2018-08-27: 40 ug via INTRAVENOUS
  Administered 2018-08-27: 20 ug via INTRAVENOUS
  Administered 2018-08-27: 10 ug via INTRAVENOUS
  Administered 2018-08-27: 80 ug via INTRAVENOUS
  Administered 2018-08-27: 20 ug via INTRAVENOUS
  Administered 2018-08-27: 80 ug via INTRAVENOUS

## 2018-08-27 MED ORDER — LACTATED RINGERS IV SOLN: 500.0000 mL | Freq: Once | INTRAVENOUS | Status: DC | PRN

## 2018-08-27 MED ORDER — SODIUM CHLORIDE 0.9 % IV SOLN
INTRAVENOUS | Status: DC
  Filled 2018-08-27: qty 1

## 2018-08-27 MED ORDER — ALBUMIN HUMAN 5 % IV SOLN
INTRAVENOUS | Status: DC | PRN
  Administered 2018-08-27: 11:00:00 via INTRAVENOUS

## 2018-08-27 MED ORDER — MORPHINE SULFATE (PF) 2 MG/ML IV SOLN
1.0000 mg | INTRAVENOUS | Status: AC | PRN
  Administered 2018-08-27 (×4): 2 mg via INTRAVENOUS
  Administered 2018-08-27 (×2): 1 mg via INTRAVENOUS
  Filled 2018-08-27 (×4): qty 1

## 2018-08-27 MED ORDER — NOREPINEPHRINE 4 MG/250ML-% IV SOLN
0.0000 ug/min | INTRAVENOUS | Status: DC
  Filled 2018-08-27: qty 250

## 2018-08-27 MED ORDER — ASPIRIN EC 325 MG PO TBEC: 325.0000 mg | DELAYED_RELEASE_TABLET | Freq: Every day | ORAL | Status: DC

## 2018-08-27 MED ORDER — SODIUM CHLORIDE 0.9 % IV SOLN
1.5000 g | Freq: Two times a day (BID) | INTRAVENOUS | Status: AC
  Administered 2018-08-27 – 2018-08-29 (×4): 1.5 g via INTRAVENOUS
  Filled 2018-08-27 (×4): qty 1.5

## 2018-08-27 MED ORDER — 0.9 % SODIUM CHLORIDE (POUR BTL) OPTIME
TOPICAL | Status: DC | PRN
  Administered 2018-08-27: 3000 mL

## 2018-08-27 MED ORDER — LACTATED RINGERS IV SOLN
INTRAVENOUS | Status: DC | PRN
  Administered 2018-08-27 (×2): via INTRAVENOUS

## 2018-08-27 MED ORDER — ACETAMINOPHEN 160 MG/5ML PO SOLN: 650.0000 mg | Freq: Once | ORAL | Status: AC

## 2018-08-27 MED ORDER — PANTOPRAZOLE SODIUM 40 MG PO TBEC
40.0000 mg | DELAYED_RELEASE_TABLET | Freq: Every day | ORAL | Status: DC
  Administered 2018-08-28: 40 mg via ORAL
  Filled 2018-08-27: qty 1

## 2018-08-27 MED ORDER — ACETAMINOPHEN 500 MG PO TABS
1000.0000 mg | ORAL_TABLET | Freq: Four times a day (QID) | ORAL | Status: DC
  Administered 2018-08-27 – 2018-08-29 (×4): 1000 mg via ORAL
  Filled 2018-08-27 (×6): qty 2

## 2018-08-27 MED ORDER — PROTAMINE SULFATE 10 MG/ML IV SOLN
INTRAVENOUS | Status: AC
  Filled 2018-08-27: qty 25

## 2018-08-27 MED ORDER — OXYCODONE HCL 5 MG PO TABS: 5.0000 mg | ORAL_TABLET | ORAL | Status: DC | PRN

## 2018-08-27 MED ORDER — INSULIN REGULAR BOLUS VIA INFUSION
0.0000 [IU] | Freq: Three times a day (TID) | INTRAVENOUS | Status: DC
  Filled 2018-08-27: qty 10

## 2018-08-27 MED ORDER — HEPARIN SODIUM (PORCINE) 1000 UNIT/ML IJ SOLN
INTRAMUSCULAR | Status: DC | PRN
  Administered 2018-08-27 (×2): 9 mL via INTRAVENOUS

## 2018-08-27 MED ORDER — ASPIRIN 81 MG PO CHEW
324.0000 mg | CHEWABLE_TABLET | Freq: Every day | ORAL | Status: DC
  Administered 2018-08-28: 324 mg
  Filled 2018-08-27: qty 4

## 2018-08-27 MED ORDER — METOPROLOL TARTRATE 5 MG/5ML IV SOLN: 2.5000 mg | INTRAVENOUS | Status: DC | PRN

## 2018-08-27 MED ORDER — SODIUM CHLORIDE 0.9% FLUSH
10.0000 mL | Freq: Two times a day (BID) | INTRAVENOUS | Status: DC
  Administered 2018-08-27: 10 mL
  Administered 2018-08-28: 20 mL
  Administered 2018-08-28: 10 mL

## 2018-08-27 MED ORDER — CHLORHEXIDINE GLUCONATE 0.12 % MT SOLN
15.0000 mL | OROMUCOSAL | Status: AC
  Administered 2018-08-27: 15 mL via OROMUCOSAL
  Filled 2018-08-27: qty 15

## 2018-08-27 MED ORDER — EPHEDRINE 5 MG/ML INJ
INTRAVENOUS | Status: AC
  Filled 2018-08-27: qty 10

## 2018-08-27 MED ORDER — BISACODYL 5 MG PO TBEC
10.0000 mg | DELAYED_RELEASE_TABLET | Freq: Every day | ORAL | Status: DC
  Administered 2018-08-28: 10 mg via ORAL
  Filled 2018-08-27: qty 2

## 2018-08-27 MED ORDER — FAMOTIDINE IN NACL 20-0.9 MG/50ML-% IV SOLN
20.0000 mg | Freq: Two times a day (BID) | INTRAVENOUS | Status: AC
  Administered 2018-08-27: 20 mg via INTRAVENOUS

## 2018-08-27 MED ORDER — PLASMA-LYTE 148 IV SOLN
INTRAVENOUS | Status: DC | PRN
  Administered 2018-08-27: 500 mL via INTRAVASCULAR

## 2018-08-27 MED ORDER — BISACODYL 10 MG RE SUPP: 10.0000 mg | Freq: Every day | RECTAL | Status: DC

## 2018-08-27 MED ORDER — PHENYLEPHRINE 40 MCG/ML (10ML) SYRINGE FOR IV PUSH (FOR BLOOD PRESSURE SUPPORT)
PREFILLED_SYRINGE | INTRAVENOUS | Status: AC
  Filled 2018-08-27: qty 10

## 2018-08-27 MED ORDER — POTASSIUM CHLORIDE 10 MEQ/50ML IV SOLN
10.0000 meq | Freq: Once | INTRAVENOUS | Status: AC
  Administered 2018-08-27: 10 meq via INTRAVENOUS

## 2018-08-27 MED ORDER — SODIUM CHLORIDE 0.9% FLUSH: 3.0000 mL | INTRAVENOUS | Status: DC | PRN

## 2018-08-27 MED ORDER — SODIUM CHLORIDE 0.9% FLUSH
10.0000 mL | INTRAVENOUS | Status: DC | PRN
  Administered 2018-08-27: 20 mL
  Filled 2018-08-27: qty 40

## 2018-08-27 MED ORDER — DEXMEDETOMIDINE HCL IN NACL 200 MCG/50ML IV SOLN
0.0000 ug/kg/h | INTRAVENOUS | Status: DC
  Filled 2018-08-27: qty 50

## 2018-08-27 MED ORDER — HEMOSTATIC AGENTS (NO CHARGE) OPTIME
TOPICAL | Status: DC | PRN
  Administered 2018-08-27: 3 via TOPICAL

## 2018-08-27 MED ORDER — MIDAZOLAM HCL 5 MG/5ML IJ SOLN
INTRAMUSCULAR | Status: DC | PRN
  Administered 2018-08-27 (×2): 2 mg via INTRAVENOUS
  Administered 2018-08-27: 3 mg via INTRAVENOUS
  Administered 2018-08-27: 2 mg via INTRAVENOUS
  Administered 2018-08-27: 1 mg via INTRAVENOUS
  Administered 2018-08-27: 3 mg via INTRAVENOUS

## 2018-08-27 MED ORDER — NITROGLYCERIN IN D5W 200-5 MCG/ML-% IV SOLN
INTRAVENOUS | Status: DC | PRN
  Administered 2018-08-27: 10 ug/min via INTRAVENOUS

## 2018-08-27 MED ORDER — MAGNESIUM SULFATE 4 GM/100ML IV SOLN
4.0000 g | Freq: Once | INTRAVENOUS | Status: AC
  Administered 2018-08-27: 4 g via INTRAVENOUS
  Filled 2018-08-27: qty 100

## 2018-08-27 MED ORDER — SODIUM CHLORIDE 0.9% FLUSH
3.0000 mL | Freq: Two times a day (BID) | INTRAVENOUS | Status: DC
  Administered 2018-08-28: 3 mL via INTRAVENOUS

## 2018-08-27 SURGICAL SUPPLY — 81 items
BAG DECANTER FOR FLEXI CONT (MISCELLANEOUS) ×4 IMPLANT
BANDAGE ACE 4X5 VEL STRL LF (GAUZE/BANDAGES/DRESSINGS) IMPLANT
BANDAGE ACE 6X5 VEL STRL LF (GAUZE/BANDAGES/DRESSINGS) IMPLANT
BLADE STERNUM SYSTEM 6 (BLADE) ×4 IMPLANT
BLOWER MISTER CAL-MED (MISCELLANEOUS) ×4 IMPLANT
BNDG GAUZE ELAST 4 BULKY (GAUZE/BANDAGES/DRESSINGS) IMPLANT
CANISTER SUCT 3000ML PPV (MISCELLANEOUS) ×4 IMPLANT
CATH ROBINSON RED A/P 18FR (CATHETERS) ×8 IMPLANT
CATH THORACIC 28FR (CATHETERS) ×4 IMPLANT
CATH THORACIC 36FR (CATHETERS) ×4 IMPLANT
CATH THORACIC 36FR RT ANG (CATHETERS) ×4 IMPLANT
COVER WAND RF STERILE (DRAPES) IMPLANT
CRADLE DONUT ADULT HEAD (MISCELLANEOUS) ×4 IMPLANT
DRAPE CARDIOVASCULAR INCISE (DRAPES) ×2
DRAPE SLUSH/WARMER DISC (DRAPES) ×4 IMPLANT
DRAPE SRG 135X102X78XABS (DRAPES) ×2 IMPLANT
DRSG AQUACEL AG ADV 3.5X14 (GAUZE/BANDAGES/DRESSINGS) ×4 IMPLANT
DRSG COVADERM 4X14 (GAUZE/BANDAGES/DRESSINGS) ×4 IMPLANT
ELECT CAUTERY BLADE 6.4 (BLADE) ×4 IMPLANT
ELECT REM PT RETURN 9FT ADLT (ELECTROSURGICAL) ×8
ELECTRODE REM PT RTRN 9FT ADLT (ELECTROSURGICAL) ×4 IMPLANT
FELT TEFLON 1X6 (MISCELLANEOUS) ×4 IMPLANT
GAUZE SPONGE 4X4 12PLY STRL (GAUZE/BANDAGES/DRESSINGS) ×8 IMPLANT
GAUZE SPONGE 4X4 12PLY STRL LF (GAUZE/BANDAGES/DRESSINGS) ×4 IMPLANT
GLOVE BIO SURGEON STRL SZ 6 (GLOVE) IMPLANT
GLOVE BIO SURGEON STRL SZ 6.5 (GLOVE) ×9 IMPLANT
GLOVE BIO SURGEON STRL SZ7 (GLOVE) ×4 IMPLANT
GLOVE BIO SURGEON STRL SZ7.5 (GLOVE) ×4 IMPLANT
GLOVE BIO SURGEONS STRL SZ 6.5 (GLOVE) ×3
GLOVE BIOGEL PI IND STRL 6 (GLOVE) IMPLANT
GLOVE BIOGEL PI IND STRL 6.5 (GLOVE) ×4 IMPLANT
GLOVE BIOGEL PI IND STRL 7.0 (GLOVE) IMPLANT
GLOVE BIOGEL PI INDICATOR 6 (GLOVE)
GLOVE BIOGEL PI INDICATOR 6.5 (GLOVE) ×4
GLOVE BIOGEL PI INDICATOR 7.0 (GLOVE)
GLOVE EUDERMIC 7 POWDERFREE (GLOVE) ×8 IMPLANT
GOWN STRL REUS W/ TWL LRG LVL3 (GOWN DISPOSABLE) ×10 IMPLANT
GOWN STRL REUS W/ TWL XL LVL3 (GOWN DISPOSABLE) ×2 IMPLANT
GOWN STRL REUS W/TWL LRG LVL3 (GOWN DISPOSABLE) ×10
GOWN STRL REUS W/TWL XL LVL3 (GOWN DISPOSABLE) ×2
HEMOSTAT POWDER SURGIFOAM 1G (HEMOSTASIS) ×12 IMPLANT
HEMOSTAT SURGICEL 2X14 (HEMOSTASIS) ×4 IMPLANT
KIT BASIN OR (CUSTOM PROCEDURE TRAY) ×4 IMPLANT
KIT CATH CPB BARTLE (MISCELLANEOUS) IMPLANT
KIT SUCTION CATH 14FR (SUCTIONS) ×4 IMPLANT
KIT TURNOVER KIT B (KITS) ×4 IMPLANT
KIT VASOVIEW HEMOPRO VH 3000 (KITS) ×4 IMPLANT
NS IRRIG 1000ML POUR BTL (IV SOLUTION) ×12 IMPLANT
OFFPUMP STABILIZER SUV (MISCELLANEOUS) IMPLANT
PACK E OPEN HEART (SUTURE) ×4 IMPLANT
PACK OPEN HEART (CUSTOM PROCEDURE TRAY) ×4 IMPLANT
PAD ARMBOARD 7.5X6 YLW CONV (MISCELLANEOUS) ×8 IMPLANT
PENCIL BUTTON HOLSTER BLD 10FT (ELECTRODE) IMPLANT
SET CARDIOPLEGIA MPS 5001102 (MISCELLANEOUS) ×4 IMPLANT
SPONGE LAP 18X18 X RAY DECT (DISPOSABLE) IMPLANT
SPONGE LAP 4X18 RFD (DISPOSABLE) ×4 IMPLANT
SUT BONE WAX W31G (SUTURE) ×4 IMPLANT
SUT MNCRL AB 4-0 PS2 18 (SUTURE) IMPLANT
SUT PROLENE 3 0 SH1 36 (SUTURE) IMPLANT
SUT PROLENE 6 0 C 1 30 (SUTURE) ×8 IMPLANT
SUT PROLENE 7 0 BV1 MDA (SUTURE) IMPLANT
SUT STEEL 6MS V (SUTURE) IMPLANT
SUT STEEL STERNAL CCS#1 18IN (SUTURE) IMPLANT
SUT STEEL SZ 6 DBL 3X14 BALL (SUTURE) IMPLANT
SUT VIC AB 1 CTX 36 (SUTURE) ×4
SUT VIC AB 1 CTX36XBRD ANBCTR (SUTURE) ×4 IMPLANT
SUT VIC AB 2-0 CT1 27 (SUTURE)
SUT VIC AB 2-0 CT1 TAPERPNT 27 (SUTURE) IMPLANT
SUT VIC AB 2-0 CTX 27 (SUTURE) IMPLANT
SUT VIC AB 3-0 SH 27 (SUTURE)
SUT VIC AB 3-0 SH 27X BRD (SUTURE) IMPLANT
SUT VIC AB 3-0 X1 27 (SUTURE) IMPLANT
SYSTEM SAHARA CHEST DRAIN ATS (WOUND CARE) ×4 IMPLANT
TAPE CLOTH SURG 4X10 WHT LF (GAUZE/BANDAGES/DRESSINGS) ×4 IMPLANT
TAPES RETRACTO (MISCELLANEOUS) ×4 IMPLANT
TOWEL GREEN STERILE (TOWEL DISPOSABLE) ×4 IMPLANT
TOWEL GREEN STERILE FF (TOWEL DISPOSABLE) ×4 IMPLANT
TRAY FOLEY SLVR 16FR TEMP STAT (SET/KITS/TRAYS/PACK) ×4 IMPLANT
TUBING INSUFFLATION (TUBING) ×4 IMPLANT
UNDERPAD 30X30 (UNDERPADS AND DIAPERS) ×4 IMPLANT
WATER STERILE IRR 1000ML POUR (IV SOLUTION) ×4 IMPLANT

## 2018-08-27 NOTE — Transfer of Care (Signed)
Immediate Anesthesia Transfer of Care Note  Patient: BETTYLEE FEIG  Procedure(s) Performed: CORONARY ARTERY BYPASS GRAFTING (CABG) x 1 using LEFT INTERNAL MAMMARY ARTERY (N/A Chest) TRANSESOPHAGEAL ECHOCARDIOGRAM (TEE) (N/A )  Patient Location: ICU  Anesthesia Type:General  Level of Consciousness: sedated, unresponsive and Patient remains intubated per anesthesia plan  Airway & Oxygen Therapy: Patient remains intubated per anesthesia plan and Patient placed on Ventilator (see vital sign flow sheet for setting)  Post-op Assessment: Report given to RN and Post -op Vital signs reviewed and stable  Post vital signs: Reviewed and stable  Last Vitals:  Vitals Value Taken Time  BP    Temp    Pulse    Resp    SpO2      Last Pain:  Vitals:   08/26/18 2100  TempSrc:   PainSc: 0-No pain         Complications: No apparent anesthesia complications

## 2018-08-27 NOTE — Anesthesia Procedure Notes (Signed)
Central Venous Catheter Insertion Performed by: Catalina Gravel, MD, anesthesiologist Start/End10/05/2018 6:55 AM, 08/27/2018 7:05 AM Patient location: Pre-op. Preanesthetic checklist: patient identified, IV checked, site marked, risks and benefits discussed, surgical consent, monitors and equipment checked, pre-op evaluation, timeout performed and anesthesia consent Position: Trendelenburg Lidocaine 1% used for infiltration and patient sedated Hand hygiene performed , maximum sterile barriers used  and Seldinger technique used Catheter size: 8.5 Fr Total catheter length 10. Central line was placed.Sheath introducer Swan type:thermodilution PA Cath depth:50 Procedure performed using ultrasound guided technique. Ultrasound Notes:anatomy identified, needle tip was noted to be adjacent to the nerve/plexus identified, no ultrasound evidence of intravascular and/or intraneural injection and image(s) printed for medical record Attempts: 1 Following insertion, line sutured, dressing applied and Biopatch. Post procedure assessment: blood return through all ports, free fluid flow and no air  Patient tolerated the procedure well with no immediate complications.

## 2018-08-27 NOTE — Progress Notes (Signed)
  Echocardiogram Echocardiogram Transesophageal has been performed.  Bobbye Charleston 08/27/2018, 8:22 AM

## 2018-08-27 NOTE — Progress Notes (Signed)
Patient is transported to the OR. Report given at the bedside. Heart monitor removed, cleaned and put at the box. CCMD notified.

## 2018-08-27 NOTE — Progress Notes (Signed)
RT attempted ABG X 2. Unable to obtain.

## 2018-08-27 NOTE — Op Note (Signed)
CARDIOVASCULAR SURGERY OPERATIVE NOTE  08/27/2018  Surgeon:  Gaye Pollack, MD  First Assistant: Ellwood Handler, PA-C   Preoperative Diagnosis:  Severe single-vessel coronary artery disease with in-stent restenosis of LAD.  Postoperative Diagnosis:  Same   Procedure:  1. Median Sternotomy 2. Extracorporeal circulation 3.   Coronary artery bypass grafting x 1   Left internal mammary graft to the LAD    Anesthesia:  General Endotracheal   Clinical History/Surgical Indication:  Patient is a 47 year old woman with a history of hypertension, previous smoking, family history of heart disease, and coronary artery disease who underwent stenting of the mid LAD and 09/2016 for 95% stenosis with unstable angina.  She did well until July 2019 when she presented with in-stent restenosis in the proximal to mid LAD which was successfully treated with angioplasty and drug-eluting stent placement with an additional overlapping stent placed distally due to a suspected edge dissection.  She has been continued on aspirin 81 mg and Plavix 75 mg daily.  She presents with a 3-week history of recurrent exertional chest discomfort.  She works out frequently and decide to do her own stress test.  She walked on a treadmill at a incline for about 30 minutes and then started having chest discomfort as well as discomfort in her left arm radiating up into the chest with some nausea.  This was similar to her previous symptoms and prompted her presentation.  Electrocardiogram showed sinus bradycardia with an old septal infarct but no new changes.  Cardiac catheterization today showed 95% proximal to mid LAD stenosis within the previously placed stents.  Left ventricular ejection fraction was normal at 65%.  She had some chest pain during the catheterization was significantly elevated blood pressure response.  This 47 year old  woman has had a second episode of in-stent restenosis in the proximal to mid LAD, most recently in July 2019 which was treated with drug-eluting stents.  I think the best option at this point is to proceed with coronary bypass graft surgery with a left internal mammary graft to the LAD, off pump is possible. I discussed the operative procedure with the patient  including alternatives, benefits and risks; including but not limited to bleeding, blood transfusion, infection, stroke, myocardial infarction, graft failure, heart block requiring a permanent pacemaker, organ dysfunction, and death.  [name] understands and agrees to proceed.    Preparation:  The patient was seen in the preoperative holding area and the correct patient, correct operation were confirmed with the patient after reviewing the medical record and catheterization. The consent was signed by me. Preoperative antibiotics were given. A pulmonary arterial line and radial arterial line were placed by the anesthesia team. The patient was taken back to the operating room and positioned supine on the operating room table. After being placed under general endotracheal anesthesia by the anesthesia team a foley catheter was placed. The neck, chest, abdomen, and both legs were prepped with betadine soap and solution and draped in the usual sterile manner. A surgical time-out was taken and the correct patient and operative procedure were confirmed with the nursing and anesthesia staff.   Cardiopulmonary Bypass:  A median sternotomy was performed. The pericardium was opened in the midline. Right ventricular function appeared normal. The ascending aorta was of normal size and had no palpable plaque. There were no contraindications to aortic cannulation or cross-clamping. The patient was fully systemically heparinized and the ACT was maintained > 400 sec. The proximal aortic arch was cannulated with a  71 F aortic cannula for arterial inflow.  Venous cannulation was performed via the right atrial appendage using a two-staged venous cannula. An antegrade cardioplegia/vent cannula was inserted into the mid-ascending aorta. Aortic occlusion was performed with a single cross-clamp. Systemic cooling to 32 degrees Centigrade and topical cooling of the heart with iced saline were used. Hyperkalemic antegrade cold blood cardioplegia was used to induce diastolic arrest and was then given at about 20 minute intervals throughout the period of arrest to maintain myocardial temperature at or below 10 degrees centigrade. A temperature probe was inserted into the interventricular septum and an insulating pad was placed in the pericardium.   Left internal mammary harvest:  The left side of the sternum was retracted using the Rultract retractor. The left internal mammary artery was harvested as a pedicle graft. All side branches were clipped. It was a small-sized vessel of good quality with excellent blood flow. It was ligated distally and divided. It was sprayed with topical papaverine solution to prevent vasospasm.   Coronary arteries:  The coronary arteries were examined.   LAD:  Medium caliber vessel in the mid portion beyond the stents but then tapered to a small vessel distally.   I did not feel that the combination of small LIMA and small LAD was ideal for off-pump CABG. I thought the best revascularization would be with the anastomosis more proximal on the LAD which would allow shortening of the LIMA to where the vessel caliber and blood flow were best. This could only be performed on-pump to allow the heart be be retracted enough while maintaining hemodynamic stability.   Grafts:  1. LIMA to the LAD: 1.75 mm. It was sewn end to side using 8-0 prolene continuous suture.     Completion:  The patient was rewarmed to 37 degrees Centigrade. The clamp was removed from the LIMA pedicle and there was rapid warming of the septum and return of  ventricular fibrillation. The crossclamp was removed with a time of 23 minutes. There was spontaneous return of sinus rhythm. The distal and proximal anastomoses were checked for hemostasis. The position of the grafts was satisfactory. Two temporary epicardial pacing wires were placed on the right atrium and two on the right ventricle. The patient was weaned from CPB without difficulty on no inotropes. CPB time was 36 minutes. Cardiac output was 4 LPM. TEE showed normal LV systolic function. Heparin was fully reversed with protamine and the aortic and venous cannulas removed. Hemostasis was achieved. Mediastinal and left pleural drainage tubes were placed. The sternum was closed with #6 stainless steel wires. The fascia was closed with continuous # 1 vicryl suture. The subcutaneous tissue was closed with 2-0 vicryl continuous suture. The skin was closed with 3-0 vicryl subcuticular suture. All sponge, needle, and instrument counts were reported correct at the end of the case. Dry sterile dressings were placed over the incisions and around the chest tubes which were connected to pleurevac suction. The patient was then transported to the surgical intensive care unit in  stable condition.

## 2018-08-27 NOTE — Anesthesia Procedure Notes (Addendum)
Arterial Line Insertion Start/End10/05/2018 7:20 AM, 08/27/2018 7:30 AM Performed by: Catalina Gravel, MD, anesthesiologist  Patient location: Pre-op. Preanesthetic checklist: patient identified, IV checked, site marked, risks and benefits discussed, surgical consent, monitors and equipment checked, pre-op evaluation, timeout performed and anesthesia consent Lidocaine 1% used for infiltration Right, brachial was placed Catheter size: 20 Fr Hand hygiene performed  and maximum sterile barriers used   Attempts: 1 Procedure performed using ultrasound guided technique. Ultrasound Notes:anatomy identified, needle tip was noted to be adjacent to the nerve/plexus identified, no ultrasound evidence of intravascular and/or intraneural injection and image(s) printed for medical record Following insertion, dressing applied, Biopatch and line sutured. Post procedure assessment: normal and unchanged

## 2018-08-27 NOTE — Brief Op Note (Signed)
08/22/2018 - 08/27/2018  12:44 PM  PATIENT:  Kim Villarreal  47 y.o. female  PRE-OPERATIVE DIAGNOSIS:  CAD  POST-OPERATIVE DIAGNOSIS:  CAD  PROCEDURE:  Procedure(s):  CORONARY ARTERY BYPASS GRAFTING x 1 -LIMA to LAD  TRANSESOPHAGEAL ECHOCARDIOGRAM (TEE) (N/A)  SURGEON:  Surgeon(s) and Role:    * Bartle, Fernande Boyden, MD - Primary  PHYSICIAN ASSISTANT: Jalie Eiland PA-C  ANESTHESIA:   general  EBL:  800 mL   BLOOD ADMINISTERED: CELLSAVER  DRAINS: Left Pleural Chest Tubes, Mediastinal Chest Drains   LOCAL MEDICATIONS USED:  NONE  SPECIMEN:  No Specimen  DISPOSITION OF SPECIMEN:  N/A  COUNTS:  YES  TOURNIQUET:  * No tourniquets in log *  DICTATION: .Dragon Dictation  PLAN OF CARE: Admit to inpatient   PATIENT DISPOSITION:  ICU - intubated and hemodynamically stable.   Delay start of Pharmacological VTE agent (>24hrs) due to surgical blood loss or risk of bleeding: yes

## 2018-08-27 NOTE — Progress Notes (Signed)
[facility] BRIEF SICU PROGRESS NOTE  Day of Surgery  S/P Procedure(s) (LRB): CORONARY ARTERY BYPASS GRAFTING (CABG) x 1 using LEFT INTERNAL MAMMARY ARTERY (N/A) TRANSESOPHAGEAL ECHOCARDIOGRAM (TEE) (N/A)   Extubated uneventfully NSR w/ stable BP Breathing comfortably w/ O2 sats 99-100% Chest tube output low UOP 65-75 mL/hr Labs okay  Plan: Continue routine early postop  Rexene Alberts, MD 08/27/2018 6:57 PM

## 2018-08-27 NOTE — Anesthesia Postprocedure Evaluation (Signed)
Anesthesia Post Note  Patient: Kim Villarreal  Procedure(s) Performed: CORONARY ARTERY BYPASS GRAFTING (CABG) x 1 using LEFT INTERNAL MAMMARY ARTERY (N/A Chest) TRANSESOPHAGEAL ECHOCARDIOGRAM (TEE) (N/A )     Patient location during evaluation: SICU Anesthesia Type: General Level of consciousness: sedated and patient remains intubated per anesthesia plan Pain management: pain level controlled Vital Signs Assessment: post-procedure vital signs reviewed and stable Respiratory status: patient remains intubated per anesthesia plan Cardiovascular status: stable Postop Assessment: no apparent nausea or vomiting Anesthetic complications: no    Last Vitals:  Vitals:   08/27/18 1500 08/27/18 1512  BP: 112/67   Pulse: 81 76  Resp: 19 18  Temp: 37 C 37.1 C  SpO2: 100% 100%    Last Pain:  Vitals:   08/27/18 1305  TempSrc: Core  PainSc:                  Catalina Gravel

## 2018-08-27 NOTE — Anesthesia Procedure Notes (Signed)
Procedure Name: Intubation Date/Time: 08/27/2018 7:54 AM Performed by: Orlie Dakin, CRNA Pre-anesthesia Checklist: Emergency Drugs available, Suction available, Patient identified and Patient being monitored Patient Re-evaluated:Patient Re-evaluated prior to induction Oxygen Delivery Method: Circle system utilized Preoxygenation: Pre-oxygenation with 100% oxygen Induction Type: IV induction Ventilation: Mask ventilation without difficulty Laryngoscope Size: Miller and 3 Grade View: Grade I Tube type: Subglottic suction tube Tube size: 7.5 mm Number of attempts: 1 Airway Equipment and Method: Stylet Placement Confirmation: ETT inserted through vocal cords under direct vision,  positive ETCO2 and breath sounds checked- equal and bilateral Secured at: 23 cm Tube secured with: Tape Dental Injury: Teeth and Oropharynx as per pre-operative assessment

## 2018-08-27 NOTE — Progress Notes (Signed)
OR just call, Heparin drip is turn off

## 2018-08-27 NOTE — Procedures (Signed)
Extubation Procedure Note  Patient Details:   Name: Kim Villarreal DOB: 1971-03-05 MRN: [id number]   Airway Documentation:    Vent end date: 08/27/18 Vent end time: 1522   Evaluation  O2 sats: stable throughout Complications: No apparent complications Patient did tolerate procedure well. Bilateral Breath Sounds: Clear, Diminished   Yes   Patient had a VC of 1.2L and a NIF of -30. Patient was extubated to a 4L [place]. Cuff leak was heard. No stridor was noted. RN was at the bedside with the RT during extubation.  Tiburcio Bash 08/27/2018, 3:27 PM

## 2018-08-27 NOTE — Anesthesia Procedure Notes (Signed)
Central Venous Catheter Insertion Performed by: Catalina Gravel, MD, anesthesiologist Start/End10/05/2018 7:05 AM, 08/27/2018 7:15 AM Patient location: Pre-op. Preanesthetic checklist: patient identified, IV checked, site marked, risks and benefits discussed, surgical consent, monitors and equipment checked, pre-op evaluation, timeout performed and anesthesia consent Hand hygiene performed  and maximum sterile barriers used  Total catheter length 100. PA cath was placed.Swan type:thermodilution PA Cath depth:40 Procedure performed without using ultrasound guided technique. Attempts: 1 (Required TEE intraop for successful placement) Patient tolerated the procedure well with no immediate complications.

## 2018-08-28 ENCOUNTER — Encounter (HOSPITAL_COMMUNITY): Payer: Self-pay | Admitting: Surgery

## 2018-08-28 ENCOUNTER — Inpatient Hospital Stay (HOSPITAL_COMMUNITY)

## 2018-08-28 LAB — CBC
HCT: 29.4 % — ABNORMAL LOW (ref 36.0–46.0)
HCT: 32.1 % — ABNORMAL LOW (ref 36.0–46.0)
HEMOGLOBIN: 9.8 g/dL — AB (ref 12.0–15.0)
Hemoglobin: 9.4 g/dL — ABNORMAL LOW (ref 12.0–15.0)
MCH: 28.3 pg (ref 26.0–34.0)
MCH: 26.9 pg (ref 26.0–34.0)
MCHC: 32 g/dL (ref 30.0–36.0)
MCHC: 30.5 g/dL (ref 30.0–36.0)
MCV: 88.6 fL (ref 80.0–100.0)
MCV: 88.2 fL (ref 80.0–100.0)
PLATELETS: 101 10*3/uL — AB (ref 150–400)
Platelets: 105 10*3/uL — ABNORMAL LOW (ref 150–400)
RBC: 3.32 MIL/uL — ABNORMAL LOW (ref 3.87–5.11)
RBC: 3.64 MIL/uL — AB (ref 3.87–5.11)
RDW: 13.2 % (ref 11.5–15.5)
RDW: 13.4 % (ref 11.5–15.5)
WBC: 8.3 10*3/uL (ref 4.0–10.5)
WBC: 10 10*3/uL (ref 4.0–10.5)

## 2018-08-28 LAB — CREATININE, SERUM: Creatinine, Ser: 0.83 mg/dL (ref 0.44–1.00)

## 2018-08-28 LAB — POCT I-STAT, CHEM 8
BUN: 11 mg/dL (ref 6–20)
Calcium, Ion: 1.25 mmol/L (ref 1.15–1.40)
Chloride: 98 mmol/L (ref 98–111)
Creatinine, Ser: 0.7 mg/dL (ref 0.44–1.00)
Glucose, Bld: 112 mg/dL — ABNORMAL HIGH (ref 70–99)
HEMATOCRIT: 27 % — AB (ref 36.0–46.0)
HEMOGLOBIN: 9.2 g/dL — AB (ref 12.0–15.0)
Potassium: 4 mmol/L (ref 3.5–5.1)
Sodium: 136 mmol/L (ref 135–145)
TCO2: 27 mmol/L (ref 22–32)

## 2018-08-28 LAB — MAGNESIUM
MAGNESIUM: 2.2 mg/dL (ref 1.7–2.4)
Magnesium: 2.3 mg/dL (ref 1.7–2.4)

## 2018-08-28 LAB — BASIC METABOLIC PANEL
Anion gap: 6 (ref 5–15)
BUN: 10 mg/dL (ref 6–20)
CALCIUM: 8.2 mg/dL — AB (ref 8.9–10.3)
CO2: 22 mmol/L (ref 22–32)
CREATININE: 0.75 mg/dL (ref 0.44–1.00)
Chloride: 108 mmol/L (ref 98–111)
GFR calc Af Amer: >60 mL/min (ref >60)
Glucose, Bld: 114 mg/dL — ABNORMAL HIGH (ref 70–99)
Potassium: 4.5 mmol/L (ref 3.5–5.1)
SODIUM: 136 mmol/L (ref 135–145)

## 2018-08-28 LAB — GLUCOSE, CAPILLARY
GLUCOSE-CAPILLARY: 113 mg/dL — AB (ref 70–99)
Glucose-Capillary: 100 mg/dL — ABNORMAL HIGH (ref 70–99)

## 2018-08-28 MED ORDER — ENOXAPARIN SODIUM 40 MG/0.4ML ~~LOC~~ SOLN
40.0000 mg | Freq: Every day | SUBCUTANEOUS | Status: DC
  Administered 2018-08-28: 40 mg via SUBCUTANEOUS
  Filled 2018-08-28: qty 0.4

## 2018-08-28 MED ORDER — KETOROLAC TROMETHAMINE 15 MG/ML IJ SOLN
15.0000 mg | Freq: Four times a day (QID) | INTRAMUSCULAR | Status: DC | PRN
  Administered 2018-08-28 – 2018-08-30 (×7): 15 mg via INTRAVENOUS
  Filled 2018-08-28 (×6): qty 1

## 2018-08-28 MED ORDER — TRAMADOL HCL 50 MG PO TABS
50.0000 mg | ORAL_TABLET | ORAL | Status: DC | PRN
  Administered 2018-08-28: 50 mg via ORAL
  Filled 2018-08-28: qty 1

## 2018-08-28 MED ORDER — FUROSEMIDE 10 MG/ML IJ SOLN
40.0000 mg | Freq: Once | INTRAMUSCULAR | Status: AC
  Administered 2018-08-28: 40 mg via INTRAVENOUS
  Filled 2018-08-28: qty 4

## 2018-08-28 MED ORDER — METOCLOPRAMIDE HCL 5 MG/ML IJ SOLN
10.0000 mg | Freq: Four times a day (QID) | INTRAMUSCULAR | Status: DC
  Administered 2018-08-28 (×3): 10 mg via INTRAVENOUS
  Filled 2018-08-28 (×4): qty 2

## 2018-08-28 NOTE — Progress Notes (Signed)
Patient ID: CERINITY ZYNDA, female   DOB: 05/27/1971, 47 y.o.   MRN: [id number] EVENING ROUNDS NOTE :     [address]       [place] [address]             [phone]                 1 Day Post-Op Procedure(s) (LRB): CORONARY ARTERY BYPASS GRAFTING (CABG) x 1 using LEFT INTERNAL MAMMARY ARTERY (N/A) TRANSESOPHAGEAL ECHOCARDIOGRAM (TEE) (N/A)  Total Length of Stay:  LOS: 6 days  BP (!) 124/49   Pulse 67   Temp 98.1 F (36.7 C) (Oral)   Resp 15   Ht 5' 6.5" (1.689 m)   Wt 67.5 kg   LMP 05/11/2018 (Exact Date)   SpO2 95%   BMI 23.66 kg/m   .Intake/Output      10/07 0701 - 10/08 0700 10/08 0701 - 10/09 0700   P.O.  170   I.V. (mL/kg) 3720 (55.1) 38.5 (0.6)   Blood 330    IV Piggyback 1255.6 100   Total Intake(mL/kg) 5305.6 (78.6) 308.5 (4.6)   Urine (mL/kg/hr) 1790 (1.1) 1050 (1.4)   Blood 800    Chest Tube 420 40   Total Output 3010 1090   Net +2295.6 -781.5          . sodium chloride Stopped (08/28/18 0755)  . sodium chloride    . sodium chloride 20 mL/hr at 08/27/18 1830  . cefUROXime (ZINACEF)  IV 1.5 g (08/28/18 1503)     Lab Results  Component Value Date   WBC 8.3 08/28/2018   HGB 9.2 (L) 08/28/2018   HCT 27.0 (L) 08/28/2018   PLT 101 (L) 08/28/2018   GLUCOSE 112 (H) 08/28/2018   CHOL 135 03/09/2017   TRIG 56 03/09/2017   HDL 49 03/09/2017   LDLCALC 75 03/09/2017   ALT 38 08/26/2018   AST 35 08/26/2018   NA 136 08/28/2018   K 4.0 08/28/2018   CL 98 08/28/2018   CREATININE 0.70 08/28/2018   BUN 11 08/28/2018   CO2 22 08/28/2018   INR 1.49 08/27/2018   HGBA1C 5.5 08/26/2018   Stable postop day one    Grace Isaac MD  Beeper [phone] Office [phone] 08/28/2018 6:11 PM

## 2018-08-28 NOTE — Progress Notes (Signed)
1 Day Post-Op Procedure(s) (LRB): CORONARY ARTERY BYPASS GRAFTING (CABG) x 1 using LEFT INTERNAL MAMMARY ARTERY (N/A) TRANSESOPHAGEAL ECHOCARDIOGRAM (TEE) (N/A) Subjective: Sore chest   Objective: Vital signs in last 24 hours: Temp:  [96.3 F (35.7 C)-99.3 F (37.4 C)] 98.8 F (37.1 C) (10/08 0700) Pulse Rate:  [72-88] 72 (10/08 0700) Cardiac Rhythm: Atrial paced (10/08 0400) Resp:  [12-22] 16 (10/08 0700) BP: (95-137)/(53-85) 130/85 (10/08 0744) SpO2:  [95 %-100 %] 97 % (10/08 0700) Arterial Line BP: (101-147)/(44-68) 125/49 (10/08 0700) FiO2 (%):  [40 %-50 %] 40 % (10/07 1446) Weight:  [67.5 kg] 67.5 kg (10/08 0600)  Hemodynamic parameters for last 24 hours: PAP: (13-35)/(2-14) 35/14 CO:  [2.9 L/min-5 L/min] 4.2 L/min CI:  [1.7 L/min/m2-2.9 L/min/m2] 2.4 L/min/m2  Intake/Output from previous day: 10/07 0701 - 10/08 0700 In: 5305.6 [I.V.:3720; Blood:330; IV Piggyback:1255.6] Out: 3010 [Urine:1790; Blood:800; Chest Tube:420] Intake/Output this shift: No intake/output data recorded.  General appearance: alert and cooperative Neurologic: intact Heart: regular rate and rhythm, S1, S2 normal, no murmur, click, rub or gallop Lungs: clear to auscultation bilaterally Extremities: edema mild Wound: dressing dry  Lab Results: Recent Labs    08/27/18 1805 08/28/18 0325  WBC 13.4* 8.3  HGB 10.4* 9.4*  HCT 32.2* 29.4*  PLT 112* 101*   BMET:  Recent Labs    08/27/18 0344  08/27/18 1803 08/27/18 1805 08/28/18 0325  NA 141   < > 138  --  136  K 3.7   < > 4.4  --  4.5  CL 105   < > 108  --  108  CO2 27  --   --   --  22  GLUCOSE 104*   < > 123*  --  114*  BUN 11   < > 8  --  10  CREATININE 0.87   < > 0.70 0.75 0.75  CALCIUM 9.8  --   --   --  8.2*   < > = values in this interval not displayed.    PT/INR:  Recent Labs    08/27/18 1218  LABPROT 17.9*  INR 1.49   ABG    Component Value Date/Time   PHART 7.336 (L) 08/27/2018 1623   HCO3 20.2 08/27/2018 1623    TCO2 24 08/27/2018 1803   ACIDBASEDEF 5.0 (H) 08/27/2018 1623   O2SAT 100.0 08/27/2018 1623   CBG (last 3)  Recent Labs    08/27/18 2320 08/28/18 0326 08/28/18 0738  GLUCAP 103* 113* 100*   CXR: left basilar atelectasis  ECG: sinus, non-specific changes  Assessment/Plan: S/P Procedure(s) (LRB): CORONARY ARTERY BYPASS GRAFTING (CABG) x 1 using LEFT INTERNAL MAMMARY ARTERY (N/A) TRANSESOPHAGEAL ECHOCARDIOGRAM (TEE) (N/A)  POD 1 Hemodynamically stable in sinus rhythm Continue Lopressor DC chest tubes, swan, arterial line. Diurese IS, mobilize.   LOS: 6 days    Kim Villarreal 08/28/2018

## 2018-08-29 ENCOUNTER — Inpatient Hospital Stay (HOSPITAL_COMMUNITY)

## 2018-08-29 LAB — BASIC METABOLIC PANEL
ANION GAP: 11 (ref 5–15)
BUN: 12 mg/dL (ref 6–20)
CALCIUM: 9 mg/dL (ref 8.9–10.3)
CO2: 25 mmol/L (ref 22–32)
Chloride: 103 mmol/L (ref 98–111)
Creatinine, Ser: 0.78 mg/dL (ref 0.44–1.00)
Glucose, Bld: 117 mg/dL — ABNORMAL HIGH (ref 70–99)
Potassium: 3.8 mmol/L (ref 3.5–5.1)
Sodium: 139 mmol/L (ref 135–145)

## 2018-08-29 LAB — CBC
HCT: 32 % — ABNORMAL LOW (ref 36.0–46.0)
Hemoglobin: 10.1 g/dL — ABNORMAL LOW (ref 12.0–15.0)
MCH: 27.7 pg (ref 26.0–34.0)
MCHC: 31.6 g/dL (ref 30.0–36.0)
MCV: 87.7 fL (ref 80.0–100.0)
NRBC: 0 % (ref 0.0–0.2)
PLATELETS: 96 10*3/uL — AB (ref 150–400)
RBC: 3.65 MIL/uL — AB (ref 3.87–5.11)
RDW: 13.3 % (ref 11.5–15.5)
WBC: 8.6 10*3/uL (ref 4.0–10.5)

## 2018-08-29 MED ORDER — SODIUM CHLORIDE 0.9 % IV SOLN: 250.0000 mL | INTRAVENOUS | Status: DC | PRN

## 2018-08-29 MED ORDER — PANTOPRAZOLE SODIUM 40 MG PO TBEC
40.0000 mg | DELAYED_RELEASE_TABLET | Freq: Every day | ORAL | Status: DC
  Administered 2018-08-29 – 2018-08-31 (×3): 40 mg via ORAL
  Filled 2018-08-29 (×3): qty 1

## 2018-08-29 MED ORDER — DOCUSATE SODIUM 100 MG PO CAPS
200.0000 mg | ORAL_CAPSULE | Freq: Every day | ORAL | Status: DC
  Administered 2018-08-30: 200 mg via ORAL
  Filled 2018-08-29 (×3): qty 2

## 2018-08-29 MED ORDER — ACETAMINOPHEN 325 MG PO TABS
650.0000 mg | ORAL_TABLET | Freq: Four times a day (QID) | ORAL | Status: DC | PRN
  Administered 2018-08-29 – 2018-08-31 (×7): 650 mg via ORAL
  Filled 2018-08-29 (×7): qty 2

## 2018-08-29 MED ORDER — SODIUM CHLORIDE 0.9% FLUSH
3.0000 mL | Freq: Two times a day (BID) | INTRAVENOUS | Status: DC
  Administered 2018-08-29 – 2018-08-31 (×5): 3 mL via INTRAVENOUS

## 2018-08-29 MED ORDER — ASPIRIN EC 325 MG PO TBEC
325.0000 mg | DELAYED_RELEASE_TABLET | Freq: Every day | ORAL | Status: DC
  Administered 2018-08-29 – 2018-08-31 (×3): 325 mg via ORAL
  Filled 2018-08-29 (×3): qty 1

## 2018-08-29 MED ORDER — METOPROLOL TARTRATE 12.5 MG HALF TABLET
12.5000 mg | ORAL_TABLET | Freq: Two times a day (BID) | ORAL | Status: DC
  Administered 2018-08-29: 12.5 mg via ORAL
  Filled 2018-08-29: qty 1

## 2018-08-29 MED ORDER — ONDANSETRON HCL 4 MG/2ML IJ SOLN
4.0000 mg | Freq: Four times a day (QID) | INTRAMUSCULAR | Status: DC | PRN
  Administered 2018-08-29: 4 mg via INTRAVENOUS
  Filled 2018-08-29: qty 2

## 2018-08-29 MED ORDER — POTASSIUM CHLORIDE CRYS ER 20 MEQ PO TBCR
20.0000 meq | EXTENDED_RELEASE_TABLET | Freq: Three times a day (TID) | ORAL | Status: DC
  Filled 2018-08-29: qty 1

## 2018-08-29 MED ORDER — SODIUM CHLORIDE 0.9% FLUSH: 3.0000 mL | INTRAVENOUS | Status: DC | PRN

## 2018-08-29 MED ORDER — FUROSEMIDE 40 MG PO TABS
40.0000 mg | ORAL_TABLET | Freq: Every day | ORAL | Status: AC
  Administered 2018-08-29 – 2018-08-30 (×2): 40 mg via ORAL
  Filled 2018-08-29 (×2): qty 1

## 2018-08-29 MED ORDER — POTASSIUM CHLORIDE 20 MEQ/15ML (10%) PO SOLN
20.0000 meq | Freq: Three times a day (TID) | ORAL | Status: DC
  Administered 2018-08-29: 20 meq via ORAL
  Filled 2018-08-29 (×3): qty 15

## 2018-08-29 MED ORDER — MOVING RIGHT ALONG BOOK
Freq: Once | Status: AC
  Administered 2018-08-29: 09:00:00
  Filled 2018-08-29: qty 1

## 2018-08-29 MED ORDER — ONDANSETRON HCL 4 MG PO TABS: 4.0000 mg | ORAL_TABLET | Freq: Four times a day (QID) | ORAL | Status: DC | PRN

## 2018-08-29 MED ORDER — LOSARTAN POTASSIUM 25 MG PO TABS
25.0000 mg | ORAL_TABLET | Freq: Every day | ORAL | Status: DC
  Administered 2018-08-29: 25 mg via ORAL
  Filled 2018-08-29: qty 1

## 2018-08-29 MED ORDER — METOPROLOL TARTRATE 25 MG PO TABS
25.0000 mg | ORAL_TABLET | Freq: Two times a day (BID) | ORAL | Status: DC
  Administered 2018-08-29 – 2018-08-31 (×4): 25 mg via ORAL
  Filled 2018-08-29 (×4): qty 1

## 2018-08-29 MED FILL — Magnesium Sulfate Inj 50%: INTRAMUSCULAR | Qty: 10 | Status: AC

## 2018-08-29 MED FILL — Heparin Sodium (Porcine) Inj 1000 Unit/ML: INTRAMUSCULAR | Qty: 30 | Status: AC

## 2018-08-29 MED FILL — Potassium Chloride Inj 2 mEq/ML: INTRAVENOUS | Qty: 40 | Status: AC

## 2018-08-29 MED FILL — Mannitol IV Soln 20%: INTRAVENOUS | Qty: 500 | Status: AC

## 2018-08-29 MED FILL — Lidocaine HCl(Cardiac) IV PF Soln Pref Syr 100 MG/5ML (2%): INTRAVENOUS | Qty: 5 | Status: AC

## 2018-08-29 MED FILL — Sodium Bicarbonate IV Soln 8.4%: INTRAVENOUS | Qty: 50 | Status: AC

## 2018-08-29 MED FILL — Electrolyte-R (PH 7.4) Solution: INTRAVENOUS | Qty: 3000 | Status: AC

## 2018-08-29 NOTE — Discharge Summary (Signed)
Physician Discharge Summary       [address]       [place] [address]             [phone]    Patient ID: Kim Villarreal MRN: [id number] DOB/AGE: 1971/09/23 47 y.o.  Admit date: 08/22/2018 Discharge date: 08/31/2018  Admission Diagnoses: 1. Unstable angina ([place]) 2. Coronary stent restenosis  Discharge Diagnoses:  1. S/P CABG x 1 2. 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 Thrombocytopenia 4. History of anemia 5. History of hypertension 6. History of anxiety 7. History of childhood asthma 8. History of chronic low back pain 9. History of bursitis 10. History of tendonitis 11. History of remote tobacco abuse   Procedure (s):  LEFT HEART CATH AND CORONARY ANGIOGRAPHY by Dr. Fletcher Anon on 08/22/2018:  Conclusion     Prox LAD to Mid LAD lesion is 95% stenosed.  The left ventricular systolic function is normal.  LV end diastolic pressure is normal.  The left ventricular ejection fraction is greater than 65% by visual estimate.   1.  Severe one-vessel coronary artery disease due to recurrent in-stent restenosis in the mid LAD.  No other obstructive disease. 2.  Normal LV systolic function and normal left ventricular end-diastolic pressure.  Recommendations: This is a difficult situation overall.  This is the second time for in-stent restenosis in the LAD in spite of using 2 different drug-eluting stents.  The vessel was stented just 3 months ago.  I do not think we have good options for getting good long-term results with stents or repeat balloon angioplasty.  I am going to obtain a surgical opinion for off-pump LIMA to LAD.  The patient unfortunately started having chest pain during cardiac catheterization with significantly elevated blood pressure.  I am going to admit the patient to telemetry.  She has been on Plavix which I am going to discontinue in preparation for surgery.  Start heparin drip 8 hours after sheath pull without bolus.       1. Median Sternotomy 2. Extracorporeal circulation 3.   Coronary artery bypass grafting x 1   Left internal mammary graft to the LAD by Dr. Cyndia Bent on 08/27/2018.  History of Presenting Illness: Patient is a 47 year old woman with a history of hypertension, previous smoking, family history of heart disease, and coronary artery disease who underwent stenting of the mid LAD and 09/2016 for 95% stenosis with unstable angina.  She did well until July 2019 when she presented with in-stent restenosis in the proximal to mid LAD which was successfully treated with angioplasty and drug-eluting stent placement with an additional overlapping stent placed distally due to a suspected edge dissection.  She has been continued on aspirin 81 mg and Plavix 75 mg daily.  She presents with a 3-week history of recurrent exertional chest discomfort.  She works out frequently and decide to do her own stress test.  She walked on a treadmill at a incline for about 30 minutes and then started having chest discomfort as well as discomfort in her left arm radiating up into the chest with some nausea.  This was similar to her previous symptoms and prompted her presentation.  Electrocardiogram showed sinus bradycardia with an old septal infarct but no new changes.  Cardiac catheterization today showed 95% proximal to mid LAD stenosis within the previously placed stents.  Left ventricular ejection fraction was normal at 65%.  She had some chest pain during the catheterization was significantly elevated blood pressure response. This  48 year old woman has had a second episode of in-stent restenosis in the proximal to mid LAD, most recently in July 2019 which was treated with drug-eluting stents.  I think the best option at this point is to proceed with coronary bypass graft surgery with a left internal mammary graft to the LAD.  She has been on Plavix and aspirin.  Dr. Cyndia Bent checked a P2Y12 level. Her P2Y12 platelet inhibition was  177 so Plavix working for her and she should wait until Monday for surgery if possible.   The Plavix has been discontinued.  They said that Dr. Fletcher Anon was referring her to [facility] for a LIMA to LAD via a small left thoracotomy if they wanted to pursue that. They are going to think about that. Ultimately, they decided to pursue surgery with Dr. Cyndia Bent. Pre operative carotid duplex US showed no significant right internal carotid artery stenosis and a 40-59% left internal carotid artery stenosis. Patient underwent a CABG x 1 on 08/27/2018.  Brief Hospital Course:  The patient was extubated the afternoon of surgery without difficulty. She remained afebrile and hemodynamically stable. Gordy Councilman, a line, chest tubes, and foley were removed early in the post operative course. Lopressor was started and titrated accordingly. She was volume over loaded and diuresed. She had a history of anemia and had it post op. She did not require a post op transfusion. Last H and H was 10.1 and 32. She also had thrombocytopenia. Her last platelet count was 96,000. She had post op nausea, vomiting, sweating. Her symptoms resolved once Toradol was stopped. She was weaned off the insulin drip.  The patient's glucose remained well controlled.The patient's HGA1C pre op was  5.5. The patient was felt surgically stable for transfer from the ICU to PCTU for further convalescence on 08/30/2018. She was hypertensive so Losartan was resumed. She continues to progress with cardiac rehab. She was ambulating on room air. She has been tolerating a diet and has had a bowel movement. Epicardial pacing wires were removed on 08/31/2018. Chest tube sutures will be removed in the office after discharge. As discussed with Dr. Cyndia Bent, the patient is felt surgically stable for discharge today.   Latest Vital Signs: Blood pressure 123/70, pulse 76, temperature 98.5 F (36.9 C), temperature source Oral, resp. rate 15, height 5' 6.5" (1.689 m), weight 62.9 kg,  last menstrual period 05/11/2018, SpO2 96 %.  Physical Exam: Cardiovascular: RRR Pulmonary: Slightly diminished left base Abdomen: Soft, non tender, bowel sounds present. Extremities: Trace lower extremity edema. Wound: Clean and dry.  No erythema or signs of infection.  Discharge Condition: Stable and discharged to home.  Recent laboratory studies:  Lab Results  Component Value Date   WBC 8.6 08/29/2018   HGB 10.1 (L) 08/29/2018   HCT 32.0 (L) 08/29/2018   MCV 87.7 08/29/2018   PLT 96 (L) 08/29/2018   Lab Results  Component Value Date   NA 139 08/29/2018   K 3.8 08/29/2018   CL 103 08/29/2018   CO2 25 08/29/2018   CREATININE 0.78 08/29/2018   GLUCOSE 117 (H) 08/29/2018      Diagnostic Studies: Dg Chest 2 View  Result Date: 08/26/2018 CLINICAL DATA:  Preop exam. EXAM: CHEST - 2 VIEW COMPARISON:  None. FINDINGS: The cardiomediastinal contours are normal. Coronary stent visualized. The lungs are clear. Pulmonary vasculature is normal. No consolidation, pleural effusion, or pneumothorax. No acute osseous abnormalities are seen. IMPRESSION: Coronary stent.  Otherwise negative radiographs of the chest. Electronically Signed  By: Keith Rake M.D.   On: 08/26/2018 23:07   Dg Chest Port 1 View  Result Date: 08/29/2018 CLINICAL DATA:  Status post CABG surgery. EXAM: PORTABLE CHEST 1 VIEW COMPARISON:  08/28/2018 and older studies. FINDINGS: Since the prior exam, all lines and tubes have been removed with the exception of the right internal jugular introducer sheath. Cardiac silhouette is normal in size. There is no mediastinal widening. Left coronary artery stent is stable. There is persistent left lung base opacity consistent with a combination of a small effusion and atelectasis. Remainder of the lungs is clear. No pneumothorax. IMPRESSION: 1. No acute findings or evidence of an operative complication. 2. Persistent left lung base opacity consistent with a combination of  atelectasis and a small effusion. 3. No pulmonary edema, mediastinal widening or pneumothorax. 4. Remaining right internal jugular introducer sheath is well positioned. Electronically Signed   By: Lajean Manes M.D.   On: 08/29/2018 11:39   Dg Chest Port 1 View  Result Date: 08/28/2018 CLINICAL DATA:  Post cardiac surgery. EXAM: PORTABLE CHEST 1 VIEW COMPARISON:  08/27/2018 FINDINGS: Post extubation and removal of enteric catheter. The remaining support apparatus is stable. Cardiomediastinal silhouette is normal. Mediastinal contours appear intact. No radiographically apparent pneumothorax. Possible left lower lobe atelectasis. Osseous structures are without acute abnormality. Soft tissues are grossly normal. IMPRESSION: Post extubation and enteric catheter removal. Otherwise stable support apparatus. Possible left lower lobe atelectasis. Electronically Signed   By: Fidela Salisbury M.D.   On: 08/28/2018 09:33   Dg Chest Port 1 View  Result Date: 08/27/2018 CLINICAL DATA:  Status post CABG today EXAM: PORTABLE CHEST 1 VIEW COMPARISON:  Chest x-ray of August 26, 2018 FINDINGS: The right lung is well-expanded and clear. On the left there is mild volume loss. There is no pneumothorax nor large pleural effusion. The heart and pulmonary vascularity are normal. The Swan-Ganz catheter tip projects in the proximal main pulmonary outflow tract. The endotracheal tube tip projects approximately 2 cm above the carina. The esophagogastric tube tip and proximal port project below the GE junction. 2 chest tubes and a mediastinal drain are in reasonable position on the left. IMPRESSION: Postsurgical changes. No CHF or pneumothorax. Probable left basilar atelectasis. The support tubes are in reasonable position. Electronically Signed   By: David  Martinique M.D.   On: 08/27/2018 12:51         Discharge Medications: Allergies as of 08/31/2018      Reactions   Codeine Nausea And Vomiting   Contrast Media [iodinated  Diagnostic Agents] Swelling   The patient had mild lip swelling at the end of the case without rash or respiratory distress. This might be due to dye reaction. 2017      Medication List    STOP taking these medications   clopidogrel 75 MG tablet Commonly known as:  PLAVIX   diphenhydrAMINE 50 MG tablet Commonly known as:  BENADRYL   nitroGLYCERIN 0.4 MG SL tablet Commonly known as:  NITROSTAT   predniSONE 50 MG tablet Commonly known as:  DELTASONE     TAKE these medications   acetaminophen 325 MG tablet Commonly known as:  TYLENOL Take 2 tablets (650 mg total) by mouth every 6 (six) hours as needed for mild pain.   ALPRAZolam 0.25 MG tablet Commonly known as:  XANAX Take 1 tablet (0.25 mg total) by mouth daily as needed for anxiety.   aspirin 325 MG EC tablet Take 1 tablet (325 mg total) by mouth daily. What  changed:    medication strength  how much to take   FISH OIL PO Take 1 capsule by mouth daily.   furosemide 40 MG tablet Commonly known as:  LASIX Take 1 tablet (40 mg total) by mouth daily. For 3 days then stop.   losartan 50 MG tablet Commonly known as:  COZAAR Take 50 mg by mouth daily.   MACRODANTIN 25 MG capsule Generic drug:  nitrofurantoin Take 25 mg by mouth daily as needed (for UTI prevention).   metoprolol tartrate 25 MG tablet Commonly known as:  LOPRESSOR Take 1 tablet (25 mg total) by mouth 2 (two) times daily.   potassium chloride SA 20 MEQ tablet Commonly known as:  K-DUR,KLOR-CON Take 1 tablet (20 mEq total) by mouth daily. For 3 days then stop.   PROBIOTIC PO Take 1 capsule by mouth daily.   rosuvastatin 5 MG tablet Commonly known as:  CRESTOR Take 1 tablet (5 mg total) by mouth daily.   Vitamin D3 5000 units Caps Take 5,000 Units by mouth daily.      The patient has been discharged on:   1.Beta Blocker:  Yes [  x ]                              No   [   ]                              If No, reason:  2.Ace  Inhibitor/ARB: Yes [  x ]                                     No  [    ]                                     If No, reason:  3.Statin:   Yes [ x  ]                  No  [   ]                  If No, reason:  4.Ecasa:  Yes  [ x  ]                  No   [   ]                  If No, reason:  Follow Up Appointments: Follow-up Information    Gaye Pollack, MD. Go on 10/01/2018.   Specialty:  Cardiothoracic Surgery Why:  PA/LAT CXR to be taken (at [facility] which is in the same building as Dr. Vivi Martens office) on 10/01/2018 at 1:00 pm ;Appointment time is at 1:30 pm Contact information: [place] [address] [phone]        Rise Mu, PA-C. Go on 09/17/2018.   Specialties:  Physician Assistant, Cardiology, Radiology Why:  Appointment time is at 10:00 am Contact information: [place] [address] [phone]        Nurse. Go on 09/06/2018.   Why:  Appointment is with nurse only to have chest tube sutures removed. Appointment time is  at 10:00 am Contact information: [address] [place] [address]          Signed: Sharalyn Ink [facility] 08/31/2018, 9:55 AM

## 2018-08-29 NOTE — Discharge Instructions (Signed)
Discharge Instructions:  1. You may shower, please wash incisions daily with soap and water and keep dry.  If you wish to cover wounds with dressing you may do so but please keep clean and change daily.  No tub baths or swimming until incisions have completely healed.  If your incisions become red or develop any drainage please call our office at [phone]  2. No Driving until cleared by TCTS office and you are no longer using narcotic pain medications  3. Monitor your weight daily.. Please use the same scale and weigh at same time... If you gain 5-10 lbs in 48 hours with associated lower extremity swelling, please contact our office at [phone]  4. Fever of 101.5 for at least 24 hours with no source, please contact our office at [phone]  5. Activity- up as tolerated, please walk at least 3 times per day.  Avoid strenuous activity, no lifting, pushing, or pulling with your arms over 8-10 lbs for a minimum of 6 weeks  6. If any questions or concerns arise, please do not hesitate to contact our office at [phone]

## 2018-08-29 NOTE — Progress Notes (Signed)
Patient ID: Kim Villarreal, female   DOB: 03/15/1971, 47 y.o.   MRN: [id number] TCTS Evening Rounds:  Hemodynamically stable but tending to get hypertensive. Will use some hydralazine prn and resume Cozaar in am.  Diuresing well  Ambulated.

## 2018-08-29 NOTE — Progress Notes (Signed)
2 Days Post-Op Procedure(s) (LRB): CORONARY ARTERY BYPASS GRAFTING (CABG) x 1 using LEFT INTERNAL MAMMARY ARTERY (N/A) TRANSESOPHAGEAL ECHOCARDIOGRAM (TEE) (N/A) Subjective: Feels better. Had BM.  Objective: Vital signs in last 24 hours: Temp:  [97.7 F (36.5 C)-98.5 F (36.9 C)] 98.5 F (36.9 C) (10/09 0808) Pulse Rate:  [63-87] 82 (10/09 0800) Cardiac Rhythm: Normal sinus rhythm (10/09 0800) Resp:  [12-21] 17 (10/09 0800) BP: (109-150)/(45-66) 144/58 (10/09 0800) SpO2:  [92 %-99 %] 99 % (10/09 0800) Weight:  [65 kg] 65 kg (10/09 0500)  Hemodynamic parameters for last 24 hours:    Intake/Output from previous day: 10/08 0701 - 10/09 0700 In: 529.4 [P.O.:290; I.V.:38.5; IV Piggyback:200.9] Out: 2370 [Urine:2330; Chest Tube:40] Intake/Output this shift: Total I/O In: -  Out: 90 [Urine:90]  General appearance: alert and cooperative Neurologic: intact Heart: regular rate and rhythm, S1, S2 normal, no murmur, click, rub or gallop Lungs: clear to auscultation bilaterally Extremities: edema mild Wound: dressing dry  Lab Results: Recent Labs    08/28/18 1654 08/28/18 1704 08/29/18 0540  WBC 10.0  --  8.6  HGB 9.8* 9.2* 10.1*  HCT 32.1* 27.0* 32.0*  PLT 105*  --  96*   BMET:  Recent Labs    08/28/18 0325  08/28/18 1704 08/29/18 0540  NA 136  --  136 139  K 4.5  --  4.0 3.8  CL 108  --  98 103  CO2 22  --   --  25  GLUCOSE 114*  --  112* 117*  BUN 10  --  11 12  CREATININE 0.75   < > 0.70 0.78  CALCIUM 8.2*  --   --  9.0   < > = values in this interval not displayed.    PT/INR:  Recent Labs    08/27/18 1218  LABPROT 17.9*  INR 1.49   ABG    Component Value Date/Time   PHART 7.336 (L) 08/27/2018 1623   HCO3 20.2 08/27/2018 1623   TCO2 27 08/28/2018 1704   ACIDBASEDEF 5.0 (H) 08/27/2018 1623   O2SAT 100.0 08/27/2018 1623   CBG (last 3)  Recent Labs    08/27/18 2320 08/28/18 0326 08/28/18 0738  GLUCAP 103* 113* 100*   CXR: mild left base  atelectasis  Assessment/Plan: S/P Procedure(s) (LRB): CORONARY ARTERY BYPASS GRAFTING (CABG) x 1 using LEFT INTERNAL MAMMARY ARTERY (N/A) TRANSESOPHAGEAL ECHOCARDIOGRAM (TEE) (N/A)  POD 2 Hemodynamically stable in sinus rhythm. Continue Lopressor.  Mild volume excess: continue lasix and Kdur for another couple doses.  Continue IS, ambulation  Transfer to 4E.   LOS: 7 days    Gaye Pollack 08/29/2018

## 2018-08-30 MED ORDER — LOSARTAN POTASSIUM 50 MG PO TABS
50.0000 mg | ORAL_TABLET | Freq: Every day | ORAL | Status: DC
  Administered 2018-08-30 – 2018-08-31 (×2): 50 mg via ORAL
  Filled 2018-08-30 (×2): qty 1

## 2018-08-30 NOTE — Progress Notes (Signed)
3 Days Post-Op Procedure(s) (LRB): CORONARY ARTERY BYPASS GRAFTING (CABG) x 1 using LEFT INTERNAL MAMMARY ARTERY (N/A) TRANSESOPHAGEAL ECHOCARDIOGRAM (TEE) (N/A) Subjective: No specific complaints  Objective: Vital signs in last 24 hours: Temp:  [97.8 F (36.6 C)-98.5 F (36.9 C)] 98.4 F (36.9 C) (10/10 0620) Pulse Rate:  [74-87] 83 (10/09 1600) Cardiac Rhythm: Normal sinus rhythm (10/10 0400) Resp:  [16-27] 23 (10/10 0743) BP: (143-181)/(58-90) 163/68 (10/10 0743) SpO2:  [96 %-99 %] 98 % (10/09 2335) Weight:  [64.3 kg] 64.3 kg (10/10 0500)  Hemodynamic parameters for last 24 hours:    Intake/Output from previous day: 10/09 0701 - 10/10 0700 In: 360 [P.O.:360] Out: 90 [Urine:90] Intake/Output this shift: No intake/output data recorded.  General appearance: alert and cooperative Neurologic: intact Heart: regular rate and rhythm, S1, S2 normal, no murmur, click, rub or gallop Lungs: clear to auscultation bilaterally Extremities: edema mild Wound: dressing dry  Lab Results: Recent Labs    08/28/18 1654 08/28/18 1704 08/29/18 0540  WBC 10.0  --  8.6  HGB 9.8* 9.2* 10.1*  HCT 32.1* 27.0* 32.0*  PLT 105*  --  96*   BMET:  Recent Labs    08/28/18 0325  08/28/18 1704 08/29/18 0540  NA 136  --  136 139  K 4.5  --  4.0 3.8  CL 108  --  98 103  CO2 22  --   --  25  GLUCOSE 114*  --  112* 117*  BUN 10  --  11 12  CREATININE 0.75   < > 0.70 0.78  CALCIUM 8.2*  --   --  9.0   < > = values in this interval not displayed.    PT/INR:  Recent Labs    08/27/18 1218  LABPROT 17.9*  INR 1.49   ABG    Component Value Date/Time   PHART 7.336 (L) 08/27/2018 1623   HCO3 20.2 08/27/2018 1623   TCO2 27 08/28/2018 1704   ACIDBASEDEF 5.0 (H) 08/27/2018 1623   O2SAT 100.0 08/27/2018 1623   CBG (last 3)  Recent Labs    08/27/18 2320 08/28/18 0326 08/28/18 0738  GLUCAP 103* 113* 100*    Assessment/Plan: S/P Procedure(s) (LRB): CORONARY ARTERY BYPASS GRAFTING  (CABG) x 1 using LEFT INTERNAL MAMMARY ARTERY (N/A) TRANSESOPHAGEAL ECHOCARDIOGRAM (TEE) (N/A)  POD 3  Doing well overall, ambulating, working on IS HTN: SBP 160's this am. Will resume Cozaar 50 in addition to Lopressor 25 bid.  Mild volume excess. Wt is about 4 lbs over preop. Continue Lasix and Kdur.  DC pacing wires tomorrow Home Saturday if no changes. Awaiting bed on 4E.  LOS: 8 days    Gaye Pollack 08/30/2018

## 2018-08-30 NOTE — Progress Notes (Signed)
Pt received from [facility]. VSS. CHG complete. Pt oriented to room and unit. Telemetry applied. Will continue to monitor.  Clyde Canterbury, RN

## 2018-08-30 NOTE — Progress Notes (Signed)
Attempted to call report to 4E. 

## 2018-08-30 NOTE — Plan of Care (Signed)
  Problem: Health Behavior/Discharge Planning: Goal: Ability to safely manage health-related needs after discharge will improve Outcome: Progressing   Problem: Health Behavior/Discharge Planning: Goal: Ability to manage health-related needs will improve Outcome: Progressing   Problem: Clinical Measurements: Goal: Ability to maintain clinical measurements within normal limits will improve Outcome: Progressing Goal: Will remain free from infection Outcome: Progressing Goal: Diagnostic test results will improve Outcome: Progressing Goal: Cardiovascular complication will be avoided Outcome: Progressing   Problem: Activity: Goal: Risk for activity intolerance will decrease Outcome: Progressing   Problem: Elimination: Goal: Will not experience complications related to bowel motility Outcome: Progressing Goal: Will not experience complications related to urinary retention Outcome: Progressing

## 2018-08-30 NOTE — Plan of Care (Signed)
Transferred to 5H46 via monitor and wheelchair. RN to receive in room

## 2018-08-31 ENCOUNTER — Other Ambulatory Visit: Payer: Self-pay | Admitting: Physician Assistant

## 2018-08-31 MED ORDER — METOPROLOL TARTRATE 25 MG PO TABS: 25.0000 mg | ORAL_TABLET | Freq: Two times a day (BID) | ORAL | 1 refills | Status: DC

## 2018-08-31 MED ORDER — FUROSEMIDE 40 MG PO TABS
40.0000 mg | ORAL_TABLET | Freq: Every day | ORAL | Status: DC
  Administered 2018-08-31: 40 mg via ORAL
  Filled 2018-08-31: qty 1

## 2018-08-31 MED ORDER — POTASSIUM CHLORIDE CRYS ER 20 MEQ PO TBCR
20.0000 meq | EXTENDED_RELEASE_TABLET | Freq: Every day | ORAL | Status: DC
  Filled 2018-08-31: qty 1

## 2018-08-31 MED ORDER — ASPIRIN 325 MG PO TBEC: 325.0000 mg | DELAYED_RELEASE_TABLET | Freq: Every day | ORAL | 0 refills | Status: DC

## 2018-08-31 MED ORDER — FUROSEMIDE 40 MG PO TABS: 40.0000 mg | ORAL_TABLET | Freq: Every day | ORAL | 0 refills | Status: DC

## 2018-08-31 MED ORDER — ACETAMINOPHEN 325 MG PO TABS: 650.0000 mg | ORAL_TABLET | Freq: Four times a day (QID) | ORAL | Status: DC | PRN

## 2018-08-31 MED ORDER — POTASSIUM CHLORIDE CRYS ER 20 MEQ PO TBCR: 20.0000 meq | EXTENDED_RELEASE_TABLET | Freq: Every day | ORAL | 0 refills | Status: DC

## 2018-08-31 NOTE — Care Management Note (Signed)
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   Expected Discharge Date:  08/31/18               Expected Discharge Plan:  Home/Self Care  In-House Referral:  NA  Discharge planning Services  CM Consult  Post Acute Care Choice:  NA Choice offered to:  NA  DME Arranged:    DME Agency:     HH Arranged:    HH Agency:     Status of Service:  Completed, signed off  If discussed at [place] of Stay Meetings, dates discussed:    Discharge Disposition: home/self care   Additional Comments:  Dawayne Patricia, RN 08/31/2018, 10:58 AM

## 2018-08-31 NOTE — Progress Notes (Signed)
CARDIAC REHAB PHASE I   PRE:  Rate/Rhythm: 70 SR with occ PVC    BP: sitting 167/72    SaO2: 94 RA  MODE:  Ambulation: 400 ft   POST:  Rate/Rhythm: 85 SR    BP: sitting 184/91     SaO2: 100 RA  Pt moving well. Able to get out of bed independently and walk with supervision assist. C/o nausea and pain but overall doing well. BP elevated but has not taken her meds yet. To recliner, fatigued. Ed completed with pt and husband. Good reception. Discussed sternal precautions, daily routine, ex gl, diet, and CRPII. Will send referral to [place].  [place], ACSM 08/31/2018 10:51 AM

## 2018-08-31 NOTE — Progress Notes (Signed)
Kim Villarreal to be D/C'd Home per MD order. Discussed with the patient and all questions fully answered.    IV catheter discontinued intact. Site without signs and symptoms of complications. Dressing and pressure applied.  An After Visit Summary was printed and given to the patient.  Patient escorted via [place], and D/C home via private auto.  Cyndra Numbers  08/31/2018 2:47 PM

## 2018-08-31 NOTE — Progress Notes (Signed)
Pacing wires removed per order. Patient instructed on bedrest and vital signs initiated.

## 2018-08-31 NOTE — Progress Notes (Signed)
CARDIOLOGY RECOMMENDATIONS:  Discharge is anticipated in the next 48 hours. Recommendations for medications and follow up:  Discharge Medications: Continue medications as they are currently listed in the [facility]  Follow Up: The patient's Primary Cardiologist is Dr. Rod Can   Follow up in the office in 2 week(s). We will schedule.  Signed,  Sanda Klein, MD  8:52 AM 08/31/2018  [facility]

## 2018-08-31 NOTE — Progress Notes (Addendum)
      [address]       [place] [address]             [phone]        4 Days Post-Op Procedure(s) (LRB): CORONARY ARTERY BYPASS GRAFTING (CABG) x 1 using LEFT INTERNAL MAMMARY ARTERY (N/A) TRANSESOPHAGEAL ECHOCARDIOGRAM (TEE) (N/A)  Subjective: Patient feeling much better this am. She wants to go home.  Objective: Vital signs in last 24 hours: Temp:  [98.1 F (36.7 C)-98.7 F (37.1 C)] 98.5 F (36.9 C) (10/11 0557) Pulse Rate:  [76-80] 76 (10/10 2040) Cardiac Rhythm: Normal sinus rhythm (10/10 1900) Resp:  [15-26] 15 (10/11 0557) BP: (123-170)/(68-91) 123/70 (10/11 0557) SpO2:  [96 %-97 %] 96 % (10/11 0557) Weight:  [62.9 kg] 62.9 kg (10/11 0557)  Pre op weight 62.4 kg Current Weight  08/31/18 62.9 kg        Intake/Output from previous day: 10/10 0701 - 10/11 0700 In: 219 [P.O.:840; I.V.:3] Out: 600 [Urine:600]   Physical Exam:  Cardiovascular: RRR Pulmonary: Slightly diminished left base Abdomen: Soft, non tender, bowel sounds present. Extremities: Trace lower extremity edema. Wound: Clean and dry.  No erythema or signs of infection.  Lab Results: CBC: Recent Labs    08/28/18 1654 08/28/18 1704 08/29/18 0540  WBC 10.0  --  8.6  HGB 9.8* 9.2* 10.1*  HCT 32.1* 27.0* 32.0*  PLT 105*  --  96*   BMET:  Recent Labs    08/28/18 1704 08/29/18 0540  NA 136 139  K 4.0 3.8  CL 98 103  CO2  --  25  GLUCOSE 112* 117*  BUN 11 12  CREATININE 0.70 0.78  CALCIUM  --  9.0    PT/INR:  Lab Results  Component Value Date   INR 1.49 08/27/2018   INR 0.93 10/03/2016   ABG:  INR: Will add last result for INR, ABG once components are confirmed Will add last 4 CBG results once components are confirmed  Assessment/Plan:  1. CV - SR in the 80's. On Lopressor 25 mg bid and Losartan 50 mg daily. 2.  Pulmonary - On room air. Encourage incentive spirometer 3. Volume Overload - On Lasix 40 mg daily 4.  Acute blood loss anemia - Last H  and H 10.1 and 32. 5. Thrombocytopenia-platelets yesterday 96,000 6. Remove EPW 7. Patient previously had nausea, vomiting, sweating and all symptoms have resolved once Toradol stopped. Patient is fairly certain her symptoms were form Toradol 8. Chest tube sutures will remain until after discharge 9. Hope to discharge later today, at patient requests  Sharalyn Ink [facility] 08/31/2018,7:12 AM [phone]   Chart reviewed, patient examined, agree with above. Feels much better since not taking Toradol. Using Tylenol with good pain relief. She looks good and has not had any arrhythmias. Will remove pacing wires this am and send home later today.

## 2018-09-06 ENCOUNTER — Ambulatory Visit (INDEPENDENT_AMBULATORY_CARE_PROVIDER_SITE_OTHER): Payer: Self-pay

## 2018-09-06 DIAGNOSIS — I2510 Atherosclerotic heart disease of native coronary artery without angina pectoris: Secondary | ICD-10-CM

## 2018-09-06 DIAGNOSIS — Z4802 Encounter for removal of sutures: Secondary | ICD-10-CM

## 2018-09-06 NOTE — Progress Notes (Signed)
Removed 3 sutures from chest tube sites. No signs of infection and patient tolerated well. 

## 2018-09-15 NOTE — Progress Notes (Signed)
Cardiology Office Note Date:  09/17/2018  Patient ID:  Alveena, Taira 1971-02-16, MRN [id number] PCP:  Derinda Late, MD  Cardiologist:  Dr. Fletcher Anon, MD    Chief Complaint: Hospital follow up  History of Present Illness: SHALENA EZZELL is a 47 y.o. female with history of CAD s/p PCI/DES to the mid LAD in 2017 s/p PCI/DES to the LAD s/p PCI/DES to the LAD due to ISR as below in 05/2018 s/p 1-vessel CABG on 08/27/2018 with a LIMA to LAD, prior tobacco abuse quitting ~ 10 years prior, family history of CAD, HTN, mitral regurgitation, and migraine disorder who presents for hospital follow up after 1-vessel CABG in 08/2018.   Patient underwent Myoview in 07/2016 for chest pain that noted a small region of fixed perfusion defect in the mid to apical anteroseptal region consistent with breast attenuation artifact, overall low risk. She continued to note chest pain and underwent LHC in 09/2016 that showed 95% stenosis in the mid LAD which was successfully treated with PCI/DES. She was noted to have severe myalgias with Lipitor, though seemed to be tolerating Crestor. She was seen in 08/2017, noting intermittent exertional chest tightness and was prescribed Toprol; however she did not take this given that her symptoms had resolved without intervention. She was seen on 05/22/2018 noting a 3 week history of intermittent episodes of chest tightness radiating to both arms, though mostly the right arm. Initially, this was noted after an intense boot camp, though began to occur with less intense activities as well. These symptoms were similar to her pain prior to her above PCI in 2017. She underwent diagnostic LHC on 05/23/2018 that showed severe one-vessel CAD due to ISR in the proximal to mid LAD. No other obstructive disease was noted with the rest of the coronary arteries looking normal. LVEDP was normal. She underwent successful PCI/DES to the LAD for ISR. An additional overlapping stent was placed distally  due to suspected edge dissection. She was seen again in 07/2018 noting return of angina x 3 weeks similar to her prior presentation. She underwent repeat LHC on 08/22/2018 that showed severe one-vessel CAD with proximal to mid LAD stenosis of 95% due to ISR in the mid LAD. Otherwise, there was no obstructive disease with normal LVSF and LVEDP. Given this was the 2nd time for ISR in the LAD despite using 2 different drug-eluting stents, with the most recent stent being placed just 3 months prior, CABG was recommended. P2y12 was 177, indicating Plavix was working for her. She underwent successful one-vessel CABG on 08/27/2018 with a LIMA to LAD. Post-operative coarse she was diuresed and was noted to have a mild anemia that did not require transfusion with a discharge HGB of 10.1. She was also noted to have a mild thrombocytopenia with a discharge PLT count of 96. Pre-operative workup showed a 34-19% LICA stenosis.   Patient comes in doing well today.  She has not had any further symptoms concerning for angina.  She continues to advance activities as tolerated.  She is anxious to get back to work and states she will be going back to work on her own accord prior to seeing surgery in follow-up.  Patient did not follow surgical guidelines and drove to her appointment today.  She walked 1 mile on 10/27 at a 22-minute pace with prior pace being 17 minutes with walking.  She indicates she will not participate in cardiac rehab as she is "more advanced."  She is compliant with aspirin,  Lopressor, and Crestor.  She requests a refill of Xanax which she takes 0.25 mg as needed for anxiety/RLS.  She sees cardiovascular surgery in 2 weeks for follow-up.  No falls since discharge.   Past Medical History:  Diagnosis Date  . Anemia    "as a child"  . Anxiety   . Bursitis   . Childhood asthma   . Chronic lower back pain   . Coronary artery disease    a. 09/2016: cath showing 95% stenosis of the mid-LAD. Xience 2.5x3mm DES  placed, remaining left and right coronary tree angiographically normal, lip swelling noted at the end of the case without rash or respiratory distress, treated with IV Solu-Medrol and Benadryl.  Marland Kitchen History of nuclear stress test    a. 07/2016: no sig ischemia, nl wall motion, EF 72%, small defect along mid to apical anteroseptal wall c/w breast attenuation artifact, low risk study  . Hypertension   . Migraine    "it was a bout; beginning of 2017; for ~ 1 month; went away" (05/23/2018)  . Mitral regurgitation    a. echo 08/2016: vigorous EF 65-70%, nl wall motion, nl LV diastolic fxn, mild MR  . Tendonitis     Past Surgical History:  Procedure Laterality Date  . CARDIAC CATHETERIZATION N/A 10/05/2016   Procedure: Left Heart Cath and Coronary Angiography;  Surgeon: Wellington Hampshire, MD;  Location: [facility];  Service: Cardiovascular;  Laterality: N/A;  . CARDIAC CATHETERIZATION N/A 10/05/2016   Procedure: Coronary Stent Intervention;  Surgeon: Wellington Hampshire, MD;  Location: [facility];  Service: Cardiovascular;  Laterality: N/A;  . CESAREAN SECTION  2003; 2005  . CORONARY ANGIOPLASTY WITH STENT PLACEMENT  05/23/2018   "2 stents"  . CORONARY ARTERY BYPASS GRAFT N/A 08/27/2018   Procedure: CORONARY ARTERY BYPASS GRAFTING (CABG) x 1 using LEFT INTERNAL MAMMARY ARTERY;  Surgeon: Gaye Pollack, MD;  Location: [facility];  Service: Open Heart Surgery;  Laterality: N/A;  . CORONARY STENT INTERVENTION N/A 05/23/2018   Procedure: CORONARY STENT INTERVENTION;  Surgeon: Wellington Hampshire, MD;  Location: [facility];  Service: Cardiovascular;  Laterality: N/A;  mid LAD  . ECTOPIC PREGNANCY SURGERY  1991  . LEFT HEART CATH AND CORONARY ANGIOGRAPHY N/A 05/23/2018   Procedure: LEFT HEART CATH AND CORONARY ANGIOGRAPHY;  Surgeon: Wellington Hampshire, MD;  Location: [facility];  Service: Cardiovascular;  Laterality: N/A;  . LEFT HEART CATH AND CORONARY ANGIOGRAPHY N/A 08/22/2018    Procedure: LEFT HEART CATH AND CORONARY ANGIOGRAPHY;  Surgeon: Wellington Hampshire, MD;  Location: [facility];  Service: Cardiovascular;  Laterality: N/A;  . TEE WITHOUT CARDIOVERSION N/A 08/27/2018   Procedure: TRANSESOPHAGEAL ECHOCARDIOGRAM (TEE);  Surgeon: Gaye Pollack, MD;  Location: [facility];  Service: Open Heart Surgery;  Laterality: N/A;    Current Meds  Medication Sig  . ALPRAZolam (XANAX) 0.25 MG tablet Take 1 tablet (0.25 mg total) by mouth daily as needed for anxiety.  Marland Kitchen aspirin EC 325 MG EC tablet Take 1 tablet (325 mg total) by mouth daily.  Marland Kitchen ibuprofen (ADVIL,MOTRIN) 100 MG tablet Take 100 mg by mouth 2 (two) times daily.  Marland Kitchen losartan (COZAAR) 50 MG tablet Take 50 mg by mouth daily.   . metoprolol tartrate (LOPRESSOR) 25 MG tablet TAKE 1 TABLET BY MOUTH TWICE DAILY  . nitrofurantoin (MACRODANTIN) 25 MG capsule Take 25 mg by mouth daily as needed (for UTI prevention).   . Omega-3 Fatty  Acids (FISH OIL PO) Take 1 capsule by mouth daily.   . Probiotic Product (PROBIOTIC PO) Take 1 capsule by mouth daily.  . rosuvastatin (CRESTOR) 5 MG tablet Take 1 tablet (5 mg total) by mouth daily.    Allergies:   Codeine and Contrast media [iodinated diagnostic agents]   Social History:  The patient  reports that she quit smoking about 12 years ago. Her smoking use included cigarettes. She has a 11.25 pack-year smoking history. She has never used smokeless tobacco. She reports that she drinks alcohol. She reports that she does not use drugs.   Family History:  The patient's family history includes Heart disease in her mother.  ROS:   Review of Systems  Constitutional: Positive for malaise/fatigue. Negative for chills, diaphoresis, fever and weight loss.  HENT: Negative for congestion.   Eyes: Negative for discharge and redness.  Respiratory: Negative for cough, hemoptysis, sputum production, shortness of breath and wheezing.   Cardiovascular: Negative for chest pain, palpitations,  orthopnea, claudication, leg swelling and PND.  Gastrointestinal: Negative for abdominal pain, blood in stool, heartburn, melena, nausea and vomiting.  Genitourinary: Negative for hematuria.  Musculoskeletal: Positive for neck pain. Negative for falls and myalgias.  Skin: Negative for rash.  Neurological: Negative for dizziness, tingling, tremors, sensory change, speech change, focal weakness, loss of consciousness and weakness.  Endo/Heme/Allergies: Does not bruise/bleed easily.  Psychiatric/Behavioral: Negative for substance abuse. The patient is not nervous/anxious.   All other systems reviewed and are negative.    PHYSICAL EXAM:  VS:  BP 110/60 (BP Location: Left Arm, Patient Position: Sitting, Cuff Size: Normal)   Pulse 65   Ht 5' 6.5" (1.689 m)   Wt 139 lb 4 oz (63.2 kg)   BMI 22.14 kg/m  BMI: Body mass index is 22.14 kg/m.  Physical Exam  Constitutional: She is oriented to person, place, and time. She appears well-developed and well-nourished.  HENT:  Head: Normocephalic and atraumatic.  Eyes: Right eye exhibits no discharge. Left eye exhibits no discharge.  Neck: Normal range of motion. No JVD present.  Cardiovascular: Normal rate, regular rhythm, S1 normal, S2 normal and normal heart sounds. Exam reveals no distant heart sounds, no friction rub, no midsystolic click and no opening snap.  No murmur heard. Pulses:      Posterior tibial pulses are 2+ on the right side, and 2+ on the left side.  Well-healing median anterior chest wall surgical scar  Pulmonary/Chest: Effort normal and breath sounds normal. No respiratory distress. She has no decreased breath sounds. She has no wheezes. She has no rales. She exhibits no tenderness.  Abdominal: Soft. She exhibits no distension. There is no tenderness.  Musculoskeletal: She exhibits no edema.  Neurological: She is alert and oriented to person, place, and time.  Skin: Skin is warm and dry. No cyanosis. Nails show no clubbing.    Psychiatric: She has a normal mood and affect. Her speech is normal and behavior is normal. Judgment and thought content normal.     EKG:  Was ordered and interpreted by me today. Shows NSR, 68 bpm, rare PVC, left axis deviation, nonspecific st/t changes  Recent Labs: 08/26/2018: ALT 38 08/28/2018: Magnesium 2.2 08/29/2018: BUN 12; Creatinine, Ser 0.78; Hemoglobin 10.1; Platelets 96; Potassium 3.8; Sodium 139  No results found for requested labs within last 8760 hours.   Estimated Creatinine Clearance: 83.9 mL/min (by C-G formula based on SCr of 0.78 mg/dL).   Wt Readings from Last 3 Encounters:  09/17/18 139 lb  4 oz (63.2 kg)  08/31/18 138 lb 11.2 oz (62.9 kg)  08/17/18 139 lb 12 oz (63.4 kg)     Other studies reviewed: Additional studies/records reviewed today include: summarized above  ASSESSMENT AND PLAN:  1. CAD involving the native coronary arteries status post one-vessel CABG without angina: She is doing well post bypass without symptoms concerning for angina.  She continues to advance activities as tolerated.  Advised patient to follow surgical guidelines.  Continue aspirin 325 mg daily, Crestor 5 mg daily, and Lopressor as below.  She is not interested in participating in cardiac rehab.  Aggressive risk factor modification and secondary prevention.  Follow-up with surgery as directed.  2. Hyperlipidemia: Check lipid and direct LDL.  Continue Crestor 5 mg daily.  Goal LDL less than 70.  Most recent LDL from 02/2017 of 75.  3. Hypertension: Blood pressure is well controlled today at 110/60.  Decrease Lopressor to 12.5 mg twice daily secondary to fatigue.  Continue losartan 50 mg daily.  4. RLS: Refilled Xanax x1.  Further refills will need to come from PCP.  Check CBC, iron, and ferritin.  5. Anxiety: Refilled Xanax as above.  Disposition: F/u with Dr. Fletcher Anon or an APP in 3 months  Current medicines are reviewed at length with the patient today.  The patient did not have any  concerns regarding medicines.  Signed, Christell Faith, PA-C 09/17/2018 10:15 AM     [place] [address] [place], [place] [address] [phone]

## 2018-09-17 ENCOUNTER — Ambulatory Visit (INDEPENDENT_AMBULATORY_CARE_PROVIDER_SITE_OTHER): Admitting: Physician Assistant

## 2018-09-17 ENCOUNTER — Encounter: Payer: Self-pay | Admitting: Physician Assistant

## 2018-09-17 VITALS — BP 110/60 | HR 65 | Ht 66.5 in | Wt 139.2 lb

## 2018-09-17 DIAGNOSIS — I2510 Atherosclerotic heart disease of native coronary artery without angina pectoris: Secondary | ICD-10-CM | POA: Diagnosis not present

## 2018-09-17 DIAGNOSIS — Z951 Presence of aortocoronary bypass graft: Secondary | ICD-10-CM

## 2018-09-17 DIAGNOSIS — I10 Essential (primary) hypertension: Secondary | ICD-10-CM

## 2018-09-17 DIAGNOSIS — F419 Anxiety disorder, unspecified: Secondary | ICD-10-CM

## 2018-09-17 DIAGNOSIS — G2581 Restless legs syndrome: Secondary | ICD-10-CM

## 2018-09-17 DIAGNOSIS — E782 Mixed hyperlipidemia: Secondary | ICD-10-CM | POA: Diagnosis not present

## 2018-09-17 MED ORDER — ALPRAZOLAM 0.25 MG PO TABS: 0.2500 mg | ORAL_TABLET | Freq: Every day | ORAL | 0 refills | Status: DC | PRN

## 2018-09-17 MED ORDER — METOPROLOL TARTRATE 25 MG PO TABS: 12.5000 mg | ORAL_TABLET | Freq: Two times a day (BID) | ORAL | 2 refills | Status: DC

## 2018-09-17 NOTE — Patient Instructions (Signed)
Medication Instructions:  Your physician has recommended you make the following change in your medication:  1- DECREASE Metoprolol tartrate to 12.5 mg (1/2 tablet) by mouth two times a day.  If you need a refill on your cardiac medications before your next appointment, please call your pharmacy.   Lab work: Your physician recommends that you return for lab work in: TODAY - DIRECT LDL, LIPID, CMET, CBC, FERRITIN, IRON.  If you have labs (blood work) drawn today and your tests are completely normal, you will receive your results only by: Marland Kitchen MyChart Message (if you have MyChart) OR . A paper copy in the mail If you have any lab test that is abnormal or we need to change your treatment, we will call you to review the results.  Testing/Procedures: none  Follow-Up: At [facility], you and your health needs are our priority.  As part of our continuing mission to provide you with exceptional heart care, we have created designated Provider Care Teams.  These Care Teams include your primary Cardiologist (physician) and Advanced Practice Providers (APPs -  Physician Assistants and Nurse Practitioners) who all work together to provide you with the care you need, when you need it. You will need a follow up appointment in 3 months.  You may see DR Fletcher Anon or one of the following Advanced Practice Providers on your designated Care Team:   Murray Hodgkins, NP Christell Faith, PA-C . Marrianne Mood, PA-C

## 2018-09-18 LAB — COMPREHENSIVE METABOLIC PANEL
ALBUMIN: 4.3 g/dL (ref 3.5–5.5)
ALK PHOS: 77 IU/L (ref 39–117)
ALT: 19 IU/L (ref 0–32)
AST: 16 IU/L (ref 0–40)
Albumin/Globulin Ratio: 2 (ref 1.2–2.2)
BILIRUBIN TOTAL: 0.4 mg/dL (ref 0.0–1.2)
BUN / CREAT RATIO: 15 (ref 9–23)
BUN: 12 mg/dL (ref 6–24)
CHLORIDE: 103 mmol/L (ref 96–106)
CO2: 23 mmol/L (ref 20–29)
CREATININE: 0.81 mg/dL (ref 0.57–1.00)
Calcium: 10 mg/dL (ref 8.7–10.2)
GFR calc Af Amer: 101 mL/min/{1.73_m2} (ref >59)
GFR calc non Af Amer: 87 mL/min/{1.73_m2} (ref >59)
GLUCOSE: 86 mg/dL (ref 65–99)
Globulin, Total: 2.2 g/dL (ref 1.5–4.5)
Potassium: 4.8 mmol/L (ref 3.5–5.2)
Sodium: 141 mmol/L (ref 134–144)
TOTAL PROTEIN: 6.5 g/dL (ref 6.0–8.5)

## 2018-09-18 LAB — CBC WITH DIFFERENTIAL/PLATELET
BASOS ABS: 0 10*3/uL (ref 0.0–0.2)
Basos: 1 %
EOS (ABSOLUTE): 0.2 10*3/uL (ref 0.0–0.4)
Eos: 4 %
HEMOGLOBIN: 11.5 g/dL (ref 11.1–15.9)
Hematocrit: 37.1 % (ref 34.0–46.6)
IMMATURE GRANS (ABS): 0 10*3/uL (ref 0.0–0.1)
Immature Granulocytes: 0 %
LYMPHS: 26 %
Lymphocytes Absolute: 1.4 10*3/uL (ref 0.7–3.1)
MCH: 26 pg — ABNORMAL LOW (ref 26.6–33.0)
MCHC: 31 g/dL — ABNORMAL LOW (ref 31.5–35.7)
MCV: 84 fL (ref 79–97)
MONOCYTES: 7 %
Monocytes Absolute: 0.4 10*3/uL (ref 0.1–0.9)
Neutrophils Absolute: 3.4 10*3/uL (ref 1.4–7.0)
Neutrophils: 62 %
Platelets: 286 10*3/uL (ref 150–450)
RBC: 4.43 x10E6/uL (ref 3.77–5.28)
RDW: 13.5 % (ref 12.3–15.4)
WBC: 5.5 10*3/uL (ref 3.4–10.8)

## 2018-09-18 LAB — FERRITIN: Ferritin: 35 ng/mL (ref 15–150)

## 2018-09-18 LAB — LIPID PANEL
CHOL/HDL RATIO: 2.9 ratio (ref 0.0–4.4)
Cholesterol, Total: 137 mg/dL (ref 100–199)
HDL: 47 mg/dL (ref >39)
LDL Calculated: 61 mg/dL (ref 0–99)
Triglycerides: 145 mg/dL (ref 0–149)
VLDL Cholesterol Cal: 29 mg/dL (ref 5–40)

## 2018-09-18 LAB — LDL CHOLESTEROL, DIRECT: LDL DIRECT: 61 mg/dL (ref 0–99)

## 2018-09-18 LAB — IRON: Iron: 36 ug/dL (ref 27–159)

## 2018-09-26 ENCOUNTER — Ambulatory Visit: Admitting: Nurse Practitioner

## 2018-09-28 ENCOUNTER — Other Ambulatory Visit: Payer: Self-pay | Admitting: Surgery

## 2018-09-28 DIAGNOSIS — Z951 Presence of aortocoronary bypass graft: Secondary | ICD-10-CM

## 2018-09-28 NOTE — Progress Notes (Signed)
cxr 

## 2018-10-01 ENCOUNTER — Ambulatory Visit
Admission: RE | Admit: 2018-10-01 | Discharge: 2018-10-01 | Disposition: A | Discharge status: Home / Self Care | Source: Ambulatory Visit | Attending: Surgery | Admitting: Surgery

## 2018-10-01 ENCOUNTER — Encounter: Payer: Self-pay | Admitting: Physician Assistant

## 2018-10-01 ENCOUNTER — Other Ambulatory Visit: Payer: Self-pay

## 2018-10-01 ENCOUNTER — Ambulatory Visit (INDEPENDENT_AMBULATORY_CARE_PROVIDER_SITE_OTHER): Payer: Self-pay | Admitting: Physician Assistant

## 2018-10-01 VITALS — BP 152/70 | HR 74 | Resp 18 | Ht 66.5 in | Wt 143.2 lb

## 2018-10-01 DIAGNOSIS — Z951 Presence of aortocoronary bypass graft: Secondary | ICD-10-CM

## 2018-10-01 NOTE — Patient Instructions (Signed)
You may return to driving an automobile as long as you are no longer requiring oral narcotic pain relievers during the daytime.  It would be wise to start driving only short distances during the daylight and gradually increase from there as you feel comfortable.  Make every effort to stay physically active, get some type of exercise on a regular basis, and stick to a "heart healthy diet".  The long term benefits for regular exercise and a healthy diet are critically important to your overall health and wellbeing.   

## 2018-10-01 NOTE — Progress Notes (Addendum)
[address]       [place] [address]             [phone]      Kim Villarreal is a 47 y.o. female patient s/p CABG  X 1. She had a unremarkable post-op period in the hospital. She presents today for her routine 4 week follow-up appointment.   .  1. S/P CABG x 1    Past Medical History:  Diagnosis Date  . Anemia    "as a child"  . Anxiety   . Bursitis   . Childhood asthma   . Chronic lower back pain   . Coronary artery disease    a. 09/2016: cath showing 95% stenosis of the mid-LAD. Xience 2.5x42mm DES placed, remaining left and right coronary tree angiographically normal, lip swelling noted at the end of the case without rash or respiratory distress, treated with IV Solu-Medrol and Benadryl.  Marland Kitchen History of nuclear stress test    a. 07/2016: no sig ischemia, nl wall motion, EF 72%, small defect along mid to apical anteroseptal wall c/w breast attenuation artifact, low risk study  . Hypertension   . Migraine    "it was a bout; beginning of 2017; for ~ 1 month; went away" (05/23/2018)  . Mitral regurgitation    a. echo 08/2016: vigorous EF 65-70%, nl wall motion, nl LV diastolic fxn, mild MR  . Tendonitis    No past surgical history pertinent negatives on file. Scheduled Meds: Current Outpatient Medications on File Prior to Visit  Medication Sig Dispense Refill  . ALPRAZolam (XANAX) 0.25 MG tablet Take 1 tablet (0.25 mg total) by mouth daily as needed for anxiety. 30 tablet 0  . ibuprofen (ADVIL,MOTRIN) 100 MG tablet Take 100 mg by mouth 2 (two) times daily.    Marland Kitchen losartan (COZAAR) 50 MG tablet Take 50 mg by mouth daily.     . metoprolol tartrate (LOPRESSOR) 25 MG tablet Take 0.5 tablets (12.5 mg total) by mouth 2 (two) times daily. 90 tablet 2  . nitrofurantoin (MACRODANTIN) 25 MG capsule Take 25 mg by mouth daily as needed (for UTI prevention).     . Omega-3 Fatty Acids (FISH OIL PO) Take 1 capsule by mouth daily.     . Probiotic Product (PROBIOTIC PO)  Take 1 capsule by mouth daily.    . rosuvastatin (CRESTOR) 5 MG tablet Take 1 tablet (5 mg total) by mouth daily. 90 tablet 3   No current facility-administered medications on file prior to visit.     Allergies  Allergen Reactions  . Codeine Nausea And Vomiting  . Contrast Media [Iodinated Diagnostic Agents] Swelling    The patient had mild lip swelling at the end of the case without rash or respiratory distress. This might be due to dye reaction. 2017   Active Problems:   * No active hospital problems. *  Blood pressure (!) 152/70, pulse 74, resp. rate 18, height 5' 6.5" (1.689 m), weight 143 lb 3.2 oz (65 kg), SpO2 97 %.  Subjective  Kim Villarreal is a 47 year old female status post coronary bypass grafting x1.  She presents today for her routine 4-week follow-up visit.  Overall, she is doing quite well.     Objective  Cor: NSR, no murmur Pulm: CTA bilaterally and in all fields Abd: no tenderness Wound: healing well without drainage. Clean and dry. Chest tube sutures have been removed Ext: no edema  Study Result   CLINICAL DATA:  CABG  EXAM: CHEST - 2 VIEW  COMPARISON:  08/29/2018  FINDINGS: Improved aeration left lung base with mild residual atelectasis and small effusion. Right lung clear. No heart failure or pneumothorax. Postop CABG. Left coronary stent.  IMPRESSION: Interval improvement in left lower lobe atelectasis and effusion.   Electronically Signed   By: Franchot Gallo M.D.   On: 10/01/2018 13:41     Assessment & Plan  She is ambulating without shortness of breath.  Chest x-ray was reviewed with the patient.  She is no longer taking prescribed pain medicine.  She is still taking Motrin which she claims is the only medication for pain that she can tolerate.  I did educate her on nonsteroidal anti-inflammatory medications and the risks associated in the perioperative timeframe.  I suggested trying topical creams for her shoulder and neck pain in  addition to heat therapy.  I also offered some stretches for the neck that could potentially be helpful.  She is not interested in cardiac rehab because she feels she knows a lot of the things that were reviewed during that program.  She has been through it once before after her cardiac catheterization.  She knows to only increase her weight by 2 pounds a week for resistance training.  I informed her that a stationary bike and utilizing the elliptical without the arm movements is appropriate for cardiovascular exercise.  Of course, walking is always recommended.  She has reminded her that it takes 6 weeks for the sternum to heal and to be cautious of certain movements that may strain the chest muscles.  I informed her not do push-ups, chest flies, or anything that will fire the muscles attached to her sternotomy incision.  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  She also complained of some left-sided pain and numbness which is right over where the internal mammary artery was harvested.  This pain and sensitivity should go away within a few weeks.  She is to call our office if this pain gets worse or does not resolve.  She already followed up with cardiology and has another appointment in a few weeks.  She does not need to follow-up with our office unless new issues arise.  No medication changes were made during this appointment.  Her blood pressure was slightly elevated, however she has been taking it at home and it has been within normal range.    Elgie Collard 10/01/2018

## 2018-11-06 ENCOUNTER — Ambulatory Visit: Attending: Student | Admitting: Physical Therapy

## 2018-11-06 DIAGNOSIS — M62838 Other muscle spasm: Secondary | ICD-10-CM | POA: Diagnosis present

## 2018-11-06 DIAGNOSIS — M542 Cervicalgia: Secondary | ICD-10-CM | POA: Diagnosis not present

## 2018-11-06 DIAGNOSIS — M6281 Muscle weakness (generalized): Secondary | ICD-10-CM | POA: Insufficient documentation

## 2018-11-06 NOTE — Therapy (Signed)
[facility] [address], [place], [address] Phone: [phone]   Fax:  [phone]  Physical Therapy Evaluation  Patient Details  Name: Kim Villarreal MRN: [id number] Date of Birth: [birth year] Referring Provider (PT): Lendon Colonel, [place]   Encounter Date: 11/06/2018  PT End of Session - 11/07/18 0651    Visit Number  1    Number of Visits  12    Date for PT Re-Evaluation  12/19/18    Authorization Type  [employer]    Authorization Time Period  current cert period: 66/29/4765 - 12/19/2018 (latest PN: IE 11/06/18)    Authorization - Visit Number  1    Authorization - Number of Visits  10    PT Start Time  1035    PT Stop Time  1135    PT Time Calculation (min)  60 min    Activity Tolerance  Patient tolerated treatment well    Behavior During Therapy  [facility] for tasks assessed/performed       Past Medical History:  Diagnosis Date  . Anemia    "as a child"  . Anxiety   . Bursitis   . Childhood asthma   . Chronic lower back pain   . Coronary artery disease    a. 09/2016: cath showing 95% stenosis of the mid-LAD. Xience 2.5x76mm DES placed, remaining left and right coronary tree angiographically normal, lip swelling noted at the end of the case without rash or respiratory distress, treated with IV Solu-Medrol and Benadryl.  Marland Kitchen History of nuclear stress test    a. 07/2016: no sig ischemia, nl wall motion, EF 72%, small defect along mid to apical anteroseptal wall c/w breast attenuation artifact, low risk study  . Hypertension   . Migraine    "it was a bout; beginning of 2017; for ~ 1 month; went away" (05/23/2018)  . Mitral regurgitation    a. echo 08/2016: vigorous EF 65-70%, nl wall motion, nl LV diastolic fxn, mild MR  . Tendonitis     Past Surgical History:  Procedure Laterality Date  . CARDIAC CATHETERIZATION N/A 10/05/2016   Procedure: Left Heart Cath and Coronary Angiography;  Surgeon:  Wellington Hampshire, MD;  Location: [facility];  Service: Cardiovascular;  Laterality: N/A;  . CARDIAC CATHETERIZATION N/A 10/05/2016   Procedure: Coronary Stent Intervention;  Surgeon: Wellington Hampshire, MD;  Location: [facility];  Service: Cardiovascular;  Laterality: N/A;  . CESAREAN SECTION  2003; 2005  . CORONARY ANGIOPLASTY WITH STENT PLACEMENT  05/23/2018   "2 stents"  . CORONARY ARTERY BYPASS GRAFT N/A 08/27/2018   Procedure: CORONARY ARTERY BYPASS GRAFTING (CABG) x 1 using LEFT INTERNAL MAMMARY ARTERY;  Surgeon: Gaye Pollack, MD;  Location: [facility];  Service: Open Heart Surgery;  Laterality: N/A;  . CORONARY STENT INTERVENTION N/A 05/23/2018   Procedure: CORONARY STENT INTERVENTION;  Surgeon: Wellington Hampshire, MD;  Location: [facility];  Service: Cardiovascular;  Laterality: N/A;  mid LAD  . ECTOPIC PREGNANCY SURGERY  1991  . LEFT HEART CATH AND CORONARY ANGIOGRAPHY N/A 05/23/2018   Procedure: LEFT HEART CATH AND CORONARY ANGIOGRAPHY;  Surgeon: Wellington Hampshire, MD;  Location: [facility];  Service: Cardiovascular;  Laterality: N/A;  . LEFT HEART CATH AND CORONARY ANGIOGRAPHY N/A 08/22/2018   Procedure: LEFT HEART CATH AND CORONARY ANGIOGRAPHY;  Surgeon: Wellington Hampshire, MD;  Location: [facility];  Service: Cardiovascular;  Laterality: N/A;  . TEE WITHOUT CARDIOVERSION N/A 08/27/2018   Procedure: TRANSESOPHAGEAL ECHOCARDIOGRAM (TEE);  Surgeon: Gaye Pollack, MD;  Location: [facility];  Service: Open Heart Surgery;  Laterality: N/A;    There were no vitals filed for this visit.     Subjective Assessment - 11/07/18 0645    Subjective  Patient states condition started when CABG x 1 on 08/27/2018. She had no prior neck pain. 2 years ago she started having heart symptoms (Heart symptoms: working out would get numbness in middle fingers bilaterally across both arms and chest. Burping and nausea, tightness, pain under arm pits) and had a 95% LDA and a stent was  placed and cleared up all of her symptoms. Started having symptoms again in June 2019. Did a cath and found 95% block in that stent. Put in 2 stents (within the first one, and on the end to block the tear). Blocked again at 95% within 2 months. Had CABG x 1 after staying at [place] for 10 days. Went home after 4 days. The minute she woke up from surgery her neck hurt and it has not stopped hurting since. Recently she saw her  family doctor who thought it sounded muscular and gave her a muscle relaxer. It has not been hurting nearly as much since then (about 1 week). Pain has been cut in half  This was Monday last week. In order to cope she has tried not to move left arm as much, however last Monday, she tried to use her right hand to reach down to pull out a plug in the wall and had severe shooting pain that prompted her to see her PCP about it. She states that lifting anything with any weight causes a lot of pain. The pain wakes her up at night when she rolls over. She reports full range of motion of the cervical spine and shoulders. She reports she gets increased pain the day after exercising, not during, but this is not her usual response to exercise. 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 She worked hard to lose 20# over last year but has gained 12# back since her CABG and is feeling like this neck pain is severely "ruining her life" and she doesn't have the motivation to do anything. She no longer carries nitroglycerin since the CABG.    Pertinent History  Patient is a 47 y.o. female who presents to outpatient physical therapy with a referral for medical diagnosis of musculoskeletal neck pain; s/p CABG x 1, coronary artery disease involving native coronary artery of native heart without angina pectoris. This patient's chief complaints consist of left sided neck pain that radiates from base of scull to lateral shoulder, and occasionally to right base of skull with onset  with CABG surgery on 08/27/2018 (harvest site left mammary artery), leading to the following functional deficits: difficulty performing usual workouts, light aerobic exercise, reaching, using left arm for anything, shrugging shoulders, working, reduced quality of life.Relevant past medical history and comorbidities include history of 95% occlusion of LDA x 2 with failure of stent placement leading to CABG x 1, former smoker, chronic lower back pain, anxiety, mitral regurgitation, anxiety, history of headaches. Imaging: chest radiograph report from 08/29/2018: "IMPRESSION: Interval improvement in left lower lobe atelectasis and effusion"Response to previously administered skilled services: none for this problem, declined cardiac rehab following CABG after poor experience following stent placement years ago (exercise was too easy).  Limitations  Lifting;Walking;House hold activities;Other (comment)    difficulty performing usual workouts, light aerobic exercise, reaching, using left arm for anything, shrugging shoulders, working, reduced quality of life.   Diagnostic tests  Imaging: chest radiograph report from 08/29/2018: "IMPRESSION: Interval improvement in left lower lobe atelectasis and effusion"    Patient Stated Goals  Return to previous level of actiity pain free    Currently in Pain?  Yes    Pain Score  2     Pain Location  Neck    Pain Orientation  Left    Pain Descriptors / Indicators  Shooting;Sharp;Aching;Tightness    Pain Type  Acute pain    Pain Onset  More than a month ago    Pain Frequency  Constant    Aggravating Factors   using left arm, rolling over in bed, after any sort of exercise it would make it worse instead of loosening it up, taking a deep breath, lifting anything.     Pain Relieving Factors  motrin (heat does not help)     Effect of Pain on Daily Activities  "ruining her life" because she doesn't want to do anything. Everything makes it hurt. She has gained 12# and has lost a  lot of muscle tone after losing 20# last year. She feels like she has no motivation. It is hard to follow her healthy meal plan if she is unable to work out. She feels so lazy.      SUBJECTIVE: HISTORY:  Patient is a 47 y.o. female who presents to outpatient physical therapy with a referral for medical diagnosis of musculoskeletal neck pain; s/p CABG x 1, coronary artery disease involving native coronary artery of native heart without angina pectoris. This patient's chief complaints consist of left sided neck pain that radiates from base of scull to lateral shoulder, and occasionally to right base of skull with onset with CABG surgery on 08/27/2018 (harvest site left mammary artery), leading to the following functional deficits: difficulty performing usual workouts, light aerobic exercise, reaching, using left arm for anything, shrugging shoulders, working, reduced quality of life. Relevant past medical history and comorbidities include history of 95% occlusion of LDA x 2 with failure of stent placement leading to CABG x 1, former smoker, chronic lower back pain, anxiety, mitral regurgitation, anxiety, history of headaches.  Imaging: chest radiograph report from 08/29/2018: "IMPRESSION: Interval improvement in left lower lobe atelectasis and effusion" Response to previously administered skilled services: none for this problem, declined cardiac rehab following CABG after poor experience following stent placement years ago (exercise was too easy).  Patient states condition started when CABG x 1 on 08/27/2018. She had no prior neck pain. 2 years ago she started having heart symptoms (Heart symptoms: working out would get numbness in middle fingers bilaterally across both arms and chest. Burping and nausea, tightness, pain under arm pits) and had a 95% LDA and a stent was placed and cleared up all of her symptoms. Started having symptoms again in June 2019. Did a cath and found 95% block in that stent. Put in 2  stents (within the first one, and on the end to block the tear). Blocked again at 95% within 2 months. Had CABG x 1 after staying at [place] for 10 days. Went home after 4 days. The minute she woke up from surgery her neck hurt and it has not stopped hurting since. Recently she saw her  family doctor who thought it sounded muscular and gave her a muscle relaxer. It  has not been hurting nearly as much since then (about 1 week). Pain has been cut in half  This was Monday last week. In order to cope she has tried not to move left arm as much, however last Monday, she tried to use her right hand to reach down to pull out a plug in the wall and had severe shooting pain that prompted her to see her PCP about it. She states that lifting anything with any weight causes a lot of pain. The pain wakes her up at night when she rolls over. She reports full range of motion of the cervical spine and shoulders. She reports she gets increased pain the day after exercising, not during, but this is not her usual response to exercise. 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 She worked hard to lose 20# over last year but has gained 12# back since her CABG and is feeling like this neck pain is severely "ruining her life" and she doesn't have the motivation to do anything. She no longer carries nitroglycerin since the CABG.   FUNCTION Patient-reported Outcome Measure: FOTO = not completed at initial eval.  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  Hobbies: like to work out (HIIT workouts and strength training; 4-5 days a week, 1 hour each time), kayaking,  PLOF: HITT workouts and strength training 4-5 x per week for 1 hour at a time. Able to complete all activities and work without discomfort.  Current Level of Function: "ruining her life" because she doesn't want to do anything. Everything makes it hurt. She has gained 12# and has lost a lot of muscle tone after losing 20# last year.  She feels like she has no motivation. It is hard to follow her healthy meal plan if she is unable to work out. She feels so lazy.   PAIN Location: left back of head and down to the shoulder, right side pain in back of head at times. Always present. Stops at upper trap and just below CT junction. More varied since Monday. .not like a headache.  Nature: sharp and shooting, achy, tightness.  Pain Scale:  Patient reports current pain as 2/10, at best 2/10, at worst 10/10 (10 days ago), and during functional activity 10/10. Paresthesia: denies. Aggravating factors: using left arm, rolling over in bed, after any sort of exercise it would make it worse instead of loosening it up, taking a deep breath, lifting anything.  Easing factors: motrin (heat does not help)   SPECIAL SCREENING QUESTIONS Dizziness, double vision, difficulty swallowing, difficulty speaking, fainting spells or blackouts, facial numbness, or nausea: dizzy on and off (thinks it is to do with PMS, has not changed since surgery).  Recent illness or fever: denies any other.  Recent unexplained weight loss: denies. Night pain/sweats: denies Recent bowel or bladder function abnormality: denies. Current symptoms/concerns not elsewhere described: denies Denies long term use of steroids.  Denies history of spinal surgery.     [facility] PT Assessment - 11/07/18 0001      Assessment   Medical Diagnosis  musculoskeletal neck pain; s/p CABG x 1, coronary artery disease involving native coronary artery of native heart without angina pectoris    Referring Provider (PT)  Lendon Colonel, PA    Onset Date/Surgical Date  08/27/18    Hand Dominance  Right    Prior Therapy  none for this problem, declined cardiac rehab following CABG after poor  experience following stent placement years ago (exercise was too easy).      Precautions   Precautions  Sternal    Precaution Comments  for 3 months after surgery, no lifting or pulling with arms  held away from body, able to progressively lift 2 pounds at a time at thsi point      Restrictions   Weight Bearing Restrictions  No      Balance Screen   Has the patient fallen in the past 6 months  No    Has the patient had a decrease in activity level because of a fear of falling?   Yes      [place] residence   patient reports no limitations with getting around her home     Prior Function   Level of Independence  Independent      Cognition   Overall Cognitive Status  Within Functional Limits for tasks assessed      Observation/Other Assessments   Observations  see note from 11/06/2018 for latest objective data       OBJECTIVE: OBSERVATION/INSPECTION: Patient presents with good posture.  NEUROLOGICAL: Dermatomes: BUE WNL to light touch. .  SPINE MOTION Cervical Spine AROM:  *Indicates pain - Flexion: = 65. - Extension: = 60. - Rotation: R= 60, L = 55. - Side Flexion: R= 45, L = 40.  PERIPHERAL JOINT MOTION (AROM/PROM in degrees):  *Indicates pain Shoulder: B = WNL  Elbow: B = WNL  STRENGTH:  *Indicates pain Scapular elevation: bilateral 5/5 painful release Shoulder 5/5 except the following:   - Flexion:  L = 4/5 concordant pain limiting - External rotation:  L = 3/5 concordant pain! limiting. Elbow - Flexion: L = 4/5 concordant pain limiting..  Grip WNL, R > L endurance.    REPEATED MOTIONS TESTING: - Seated cervical retraction x 10 during = ERP each rep; after = no effect - seated cervical retraction to extension: during = produced thoracic pain on right side ; after = no worse.  - seated left cervical sidebending with self overpressure: during = decreasing ; after = no better/felt it upon release  SPECIAL TESTS: Cervical axial compression and distraction = negative Spurling's B = negative.   ACCESSORY MOTION:  - Cervical spine mobility WFL, mildly hypomobile at lower cervical spine.  PALPATION: - Tender to  point palpation at posterior left mastoid process with radiation to familiar regions with pressure maintained.  - Not TTP over left mid or distal upper trap, levator scap, or nearby cervical paraspinals or suboccipitals.  Objective measurements completed on examination: See above findings.    TREATMENT:   Therapeutic exercise: to centralize symptoms and improve ROM and strength required for successful completion of functional activities.  - supine chin tuck with cuing for self palpation of SCM for biofeedback to keep SCM relaxed and improve preferential activation of deep neck flexors, 5 second hold x 20.  - Education on diagnosis, prognosis, POC, anatomy and physiology of current condition.   Manual therapy: to reduce pain and tissue tension, improve range of motion, neuromodulation, in order to promote improved ability to complete functional activities. - STM with trigger point release to posterior cervical spine musculature, focusing on muscles attaching near the left mastoid process.  - upglides, CPAs, and rotational joint mobilizations along the cervical spine, focusing on upper cervical spine. Grades II-III.   HOME EXERCISE PROGRAM Access Code: [email or web address]  URL: [email or web address]  Date: 11/06/2018  Prepared by: Rosita Kea  Exercises  Supine Chin Tuck - 20 reps - 5 second hold - 3 Sets - 1x daily - 7x weekly   Patient response to treatment:  Pt tolerated treatment well. Pt was able to complete all exercises with minimal to no lasting increase in pain or discomfort. Pt required cuing for proper technique and to facilitate improved neuromuscular control, strength, range of motion, and functional ability. She reported minimal within session change in symptoms with manual or exercises but felt encouraged to be starting an active intervention for her limitations.      PT Education - 11/07/18 [phone]    Education Details  - Education on diagnosis, prognosis, POC, anatomy  and physiology of current condition.- Education on HEP including handout  cuing for exercise form/.purpose    Person(s) Educated  Patient    Methods  Explanation;Demonstration;Tactile cues;Verbal cues;Handout    Comprehension  Verbalized understanding;Returned demonstration       PT Short Term Goals - 11/07/18 0657      PT SHORT TERM GOAL #1   Title  Be independent with initial home exercise program for self-management of symptoms.    Baseline  Initial HEP provided 11/06/2018;    Time  2    Period  Weeks    Status  New    Target Date  11/21/18        PT Long Term Goals - 11/07/18 0347      PT LONG TERM GOAL #1   Title  Have full left shoulder, scapula, and cervical spine AROM with no compensations or increase in pain in all planes except intermittent end range discomfort to allow patient to complete valued activities with less difficulty.     Baseline  see objective exam    Time  6    Period  Weeks    Status  New    Target Date  12/19/18      PT LONG TERM GOAL #2   Title  Complete community, work and/or recreational activities without limitation due to current condition.     Baseline  see subjective exam    Time  6    Period  Weeks    Status  New    Target Date  12/19/18      PT LONG TERM GOAL #3   Title  Improve left shoulder and scapula strength without pain to 5/5 for improved ability to allow patient to complete valued functional tasks such as reaching overhead and working out with less difficulty.     Baseline  see objective exam    Time  6    Period  Weeks    Status  New    Target Date  12/19/18      PT LONG TERM GOAL #4   Title  Be independent with a long-term home exercise program for self-management of symptoms.     Baseline  Initial HEP provided 11/06/2018    Time  6    Period  Weeks    Status  New    Target Date  12/19/18      PT LONG TERM GOAL #5   Title  Demonstrate improved FOTO score by 10 units to demonstrate improvement in overall condition and  self-reported functional ability.     Baseline  to be measured at visit 2    Time  6    Period  Weeks    Status  New    Target Date  12/19/18  Plan - 11/07/18 0653    Clinical Impression Statement  Patient is a 47 y.o. female referred to outpatient physical therapy with a medical diagnosis of musculoskeletal neck pain; s/p CABG x 1, coronary artery disease involving native coronary artery of native heart without angina pectoris who presents with signs and symptoms consistent with left sided cervical spine pain limiting her usual activities and self care including ADLs, IADLs, reaching, lifting, fitness program, work activities, sleeping, and negatively affecting her quality of life and motivation.    History and Personal Factors relevant to plan of care:   history of 95% occlusion of LDA x 2 with failure of stent placement leading to CABG x 1, former smoker, chronic lower back pain, anxiety, mitral regurgitation, anxiety, history of headaches.     Clinical Presentation  Evolving    Clinical Presentation due to:  not improving as expected    Clinical Decision Making  Moderate    Rehab Potential  Good    Clinical Impairments Affecting Rehab Potential  (+) motivation, baseline fitness level and general health; (-) cardiac history, sternal precautions, unexpected condition behavior    PT Frequency  2x / week    PT Duration  6 weeks    PT Treatment/Interventions  ADLs/Self Care Home Management;Aquatic Therapy;Moist Heat;Cryotherapy;Therapeutic activities;Therapeutic exercise;Neuromuscular re-education;Patient/family education;Manual techniques;Passive range of motion;Dry needling;Spinal Manipulations;Joint Manipulations;Other (comment)   joint mobilizatoins grades I-V, therapeutic pain education   PT Next Visit Plan  Assess response to HEP, progress exercise as tolerated, initiate graded exercise and pain education, manual therapy.     PT Home Exercise Plan  [employer] Access Code:  [email or web address]    Recommended Other Services  possible mental health referral due to expression of feelings of low motivativation that are affecting her functionally.     Consulted and Agree with Plan of Care  Patient       Patient will benefit from skilled therapeutic intervention in order to improve the following deficits and impairments:  Decreased endurance, Increased muscle spasms, Cardiopulmonary status limiting activity, Decreased range of motion, Impaired perceived functional ability, Pain, Impaired UE functional use, Impaired flexibility, Decreased activity tolerance  Visit Diagnosis: Cervicalgia - Plan: PT plan of care cert/re-cert  Other muscle spasm - Plan: PT plan of care cert/re-cert  Muscle weakness (generalized) - Plan: PT plan of care cert/re-cert     Problem List Patient Active Problem List   Diagnosis Date Noted  . 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 Coronary stent restenosis 08/24/2018  . Essential hypertension 10/06/2016  . CAD (coronary artery disease), native coronary artery 10/06/2016  . Status post coronary artery stent placement   . Unstable angina ([place])     Nancy Nordmann, PT, DPT 11/07/2018, 9:59 AM  [facility] [address], [place], [address] Phone: [phone]   Fax:  [phone]  Name: KATERYN MARASIGAN MRN: [id number] Date of Birth: 22-Jul-1971

## 2018-11-07 ENCOUNTER — Encounter: Payer: Self-pay | Admitting: Physical Therapy

## 2018-11-12 ENCOUNTER — Encounter: Payer: Self-pay | Admitting: Physical Therapy

## 2018-11-12 ENCOUNTER — Ambulatory Visit: Admitting: Physical Therapy

## 2018-11-12 DIAGNOSIS — M542 Cervicalgia: Secondary | ICD-10-CM | POA: Diagnosis not present

## 2018-11-12 DIAGNOSIS — M62838 Other muscle spasm: Secondary | ICD-10-CM

## 2018-11-12 DIAGNOSIS — M6281 Muscle weakness (generalized): Secondary | ICD-10-CM

## 2018-11-12 NOTE — Therapy (Signed)
[facility] [address] [place], [address] Phone: [phone]   Fax:  [phone]  Physical Therapy Treatment  Patient Details  Name: Kim Villarreal MRN: [id number] Date of Birth: 11-25-1970 Referring Provider (PT): Lendon Colonel, [place]   Encounter Date: 11/12/2018  PT End of Session - 11/12/18 1619    Visit Number  2    Number of Visits  12    Date for PT Re-Evaluation  12/19/18    Authorization Type  [employer]    Authorization Time Period  current cert period: 13/24/4010 - 12/19/2018 (latest PN: IE 11/06/18)    Authorization - Visit Number  2    Authorization - Number of Visits  10    PT Start Time  1430    PT Stop Time  1515    PT Time Calculation (min)  45 min    Activity Tolerance  Patient tolerated treatment well;Patient limited by pain    Behavior During Therapy  [facility] for tasks assessed/performed       Past Medical History:  Diagnosis Date  . Anemia    "as a child"  . Anxiety   . Bursitis   . Childhood asthma   . Chronic lower back pain   . Coronary artery disease    a. 09/2016: cath showing 95% stenosis of the mid-LAD. Xience 2.5x53mm DES placed, remaining left and right coronary tree angiographically normal, lip swelling noted at the end of the case without rash or respiratory distress, treated with IV Solu-Medrol and Benadryl.  Marland Kitchen History of nuclear stress test    a. 07/2016: no sig ischemia, nl wall motion, EF 72%, small defect along mid to apical anteroseptal wall c/w breast attenuation artifact, low risk study  . Hypertension   . Migraine    "it was a bout; beginning of 2017; for ~ 1 month; went away" (05/23/2018)  . Mitral regurgitation    a. echo 08/2016: vigorous EF 65-70%, nl wall motion, nl LV diastolic fxn, mild MR  . Tendonitis     Past Surgical History:  Procedure Laterality Date  . CARDIAC CATHETERIZATION N/A 10/05/2016   Procedure: Left Heart Cath and Coronary  Angiography;  Surgeon: Wellington Hampshire, MD;  Location: [facility];  Service: Cardiovascular;  Laterality: N/A;  . CARDIAC CATHETERIZATION N/A 10/05/2016   Procedure: Coronary Stent Intervention;  Surgeon: Wellington Hampshire, MD;  Location: [facility];  Service: Cardiovascular;  Laterality: N/A;  . CESAREAN SECTION  2003; 2005  . CORONARY ANGIOPLASTY WITH STENT PLACEMENT  05/23/2018   "2 stents"  . CORONARY ARTERY BYPASS GRAFT N/A 08/27/2018   Procedure: CORONARY ARTERY BYPASS GRAFTING (CABG) x 1 using LEFT INTERNAL MAMMARY ARTERY;  Surgeon: Gaye Pollack, MD;  Location: [facility];  Service: Open Heart Surgery;  Laterality: N/A;  . CORONARY STENT INTERVENTION N/A 05/23/2018   Procedure: CORONARY STENT INTERVENTION;  Surgeon: Wellington Hampshire, MD;  Location: [facility];  Service: Cardiovascular;  Laterality: N/A;  mid LAD  . ECTOPIC PREGNANCY SURGERY  1991  . LEFT HEART CATH AND CORONARY ANGIOGRAPHY N/A 05/23/2018   Procedure: LEFT HEART CATH AND CORONARY ANGIOGRAPHY;  Surgeon: Wellington Hampshire, MD;  Location: [facility];  Service: Cardiovascular;  Laterality: N/A;  . LEFT HEART CATH AND CORONARY ANGIOGRAPHY N/A 08/22/2018   Procedure: LEFT HEART CATH AND CORONARY ANGIOGRAPHY;  Surgeon: Wellington Hampshire, MD;  Location: [facility];  Service: Cardiovascular;  Laterality: N/A;  . TEE WITHOUT CARDIOVERSION N/A 08/27/2018   Procedure: TRANSESOPHAGEAL ECHOCARDIOGRAM (TEE);  Surgeon: Gaye Pollack, MD;  Location: [facility];  Service: Open Heart Surgery;  Laterality: N/A;    There were no vitals filed for this visit.  Subjective Assessment - 11/12/18 1431    Subjective  Patient reports she is feeling about the same overall since last session. She continues to only have pain when she moves and uses her left arm.  She had a mild increase in soreness following last session. She has been doing her neck exercise from the HEP daily. it is not bothering her and she has no  questions. She reports her freind just got in a MVA and needs a new car, so that is on her mind but her freind is okay at this point.  Later she reports she did a dance workout at home after her last PT visit but minimized left arm movement, resulting in overall body soreness consistent with DOMS. She reports her cardiologist reccomended she keep her HR below 140 bpm for now.     Pertinent History  Patient is a 47 y.o. female who presents to outpatient physical therapy with a referral for medical diagnosis of musculoskeletal neck pain; s/p CABG x 1, coronary artery disease involving native coronary artery of native heart without angina pectoris. This patient's chief complaints consist of left sided neck pain that radiates from base of scull to lateral shoulder, and occasionally to right base of skull with onset with CABG surgery on 08/27/2018 (harvest site left mammary artery), leading to the following functional deficits: difficulty performing usual workouts, light aerobic exercise, reaching, using left arm for anything, shrugging shoulders, working, reduced quality of life.Relevant past medical history and comorbidities include history of 95% occlusion of LDA x 2 with failure of stent placement leading to CABG x 1, former smoker, chronic lower back pain, anxiety, mitral regurgitation, anxiety, history of headaches. Imaging: chest radiograph report from 08/29/2018: "IMPRESSION: Interval improvement in left lower lobe atelectasis and effusion"Response to previously administered skilled services: none for this problem, declined cardiac rehab following CABG after poor experience following stent placement years ago (exercise was too easy).    Limitations  Lifting;Walking;House hold activities;Other (comment)    difficulty performing usual workouts, light aerobic exercise, reaching, using left arm for anything, shrugging shoulders, working, reduced quality of life.   Diagnostic tests  Imaging: chest radiograph report  from 08/29/2018: "IMPRESSION: Interval improvement in left lower lobe atelectasis and effusion"    Patient Stated Goals  Return to previous level of actiity pain free    Currently in Pain?  No/denies    Pain Onset  More than a month ago       TREATMENT:   Therapeutic exercise: to centralize symptoms and improve ROM and strength required for successful completion of functional activities.  - Upper body ergometer seated with no added resistance to encourage joint nutrition, warm tissue, induce analgesic effect of aerobic exercise, improve muscular strength and endurance,  and prepare for remainder of session. X 5 min durng subjective exam. HR up to 101 BPM (according to apple watch).  - standing chin tuck with cervical retraction and cuing to decrease overall neck muscle activation. X 10 - Standing rows with scapular retraction for improved postural and shoulder girdle strengthening and mobility. Required instruction for technique and cuing to retract, posteriorly tilt, and depress scapulae. Red theraband, x15 reviews for HEP.  - standing right upper trap stretch with  self-overpressure, 30 sec hold x 1 - standing right levator scap stretch with self-overpressure, 30 sec hold x 1 - standing upper cervical spine flexion/suboccipital stretch with self-overpressure, one hand at chin creating fulcrum, second hand over back of head to increase nodding force, 30 sec hold x 1 - Education on diagnosis, prognosis, POC, anatomy and physiology of current condition.  - Education on HEP including handout, red theraband, and encouragement to start regular aerobic exercise.   Manual therapy: to reduce pain and tissue tension, improve range of motion, neuromodulation, in order to promote improved ability to complete functional activities. - CPA grade II-III to upper thoracic and cervical spine to tolerance to assess source of pain. Strong reproduction of pain at right inferior portion of C2 and with pressure to  the surrounding musculature. - Palpation with accessory overpressure along left clavicle and upper ribs with occasional reproduction of pain.  - STM with trigger point release L UT, and bilat suboccipitals  (3) 36mm .30 needles placed along the R UT; with pincer grasp technique points with the patient positioned in (1) prone (2) supine, and (1) along R supoccipitals with patient in prone to decrease increased muscular spasms and trigger. Good local twitch response noted. Patient was educated on risks and benefits of therapy and verbally consents to PT. Performed by Shelton Silvas, DPT  HOME EXERCISE PROGRAM Access Code: [email or web address]  URL: [email or web address]  Date: 11/12/2018  Prepared by: Rosita Kea   Exercises  Supine Chin Tuck - 20 reps - 5 second hold - 3 Sets - 1x daily - 7x weekly  Seated Cervical Retraction - 10-15 reps - 5 second hold - 3 Sets - 1x daily - 7x weekly  Seated Upper Trapezius Stretch - 1-3 reps - 30 second hold - 1x daily - 3x weekly  Seated Levator Scapulae Stretch - 1-3 reps - 30 second hold - 1x daily - 3x weekly  Sub-Occipital Cervical Stretch - 1-3 reps - 30 second hold - 1x daily - 3x weekly  Standing Bilateral Low Shoulder Row with Anchored Resistance - 10-15 reps - 1 second hold - 3 Sets - 1x daily - 3x weekly   Patient response to treatment:  Pt tolerated treatment well. She had reproduction of symptoms with palpation along left upper trap and suboccipital muscles. She felt reproduction of pain with needling to the left upper trap, especially the distal portion. She reported increased soreness but relaxed feeling after needling with overall decrease in painful sensation with exercise, although her pain was not completely abolished. She had some mild bruising at the needling sites but within expected limits. Pt was able to complete all exercises with minimal to no lasting increase in pain or discomfort. She has difficulty relaxing accessory muscles  when performing exercises. Pt required cuing for proper technique and to facilitate improved neuromuscular control, strength, range of motion, and functional ability.   PT Education - 11/12/18 1618    Education Details  exercise form/purpose. Patient was educated on risks and benefits of dry needling therapy and verbally consents to dry needling. - Education on HEP including handout, red theraband, and encouragement to start regular aerobic exercise.      Person(s) Educated  Patient    Methods  Explanation;Demonstration;Verbal cues;Tactile cues;Handout    Comprehension  Verbalized understanding;Returned demonstration       PT Short Term Goals - 11/12/18 1621      PT SHORT TERM GOAL #1   Title  Be independent with initial home exercise program for self-management  of symptoms.    Baseline  Initial HEP provided 11/06/2018;    Time  2    Period  Weeks    Status  Achieved    Target Date  11/21/17        PT Long Term Goals - 11/07/18 6789      PT LONG TERM GOAL #1   Title  Have full left shoulder, scapula, and cervical spine AROM with no compensations or increase in pain in all planes except intermittent end range discomfort to allow patient to complete valued activities with less difficulty.     Baseline  see objective exam    Time  6    Period  Weeks    Status  New    Target Date  12/19/18      PT LONG TERM GOAL #2   Title  Complete community, work and/or recreational activities without limitation due to current condition.     Baseline  see subjective exam    Time  6    Period  Weeks    Status  New    Target Date  12/19/18      PT LONG TERM GOAL #3   Title  Improve left shoulder and scapula strength without pain to 5/5 for improved ability to allow patient to complete valued functional tasks such as reaching overhead and working out with less difficulty.     Baseline  see objective exam    Time  6    Period  Weeks    Status  New    Target Date  12/19/18      PT LONG TERM  GOAL #4   Title  Be independent with a long-term home exercise program for self-management of symptoms.     Baseline  Initial HEP provided 11/06/2018    Time  6    Period  Weeks    Status  New    Target Date  12/19/18      PT LONG TERM GOAL #5   Title  Demonstrate improved FOTO score by 10 units to demonstrate improvement in overall condition and self-reported functional ability.     Baseline  to be measured at visit 2    Time  6    Period  Weeks    Status  New    Target Date  12/19/18            Plan - 11/12/18 1620    Clinical Impression Statement  Pt tolerated treatment well. She had reproduction of symptoms with palpation along left upper trap and suboccipital muscles. She felt reproduction of pain with needling to the left upper trap, especially the distal portion. She reported increased soreness but relaxed feeling after needling with overall decrease in painful sensation with exercise, although her pain was not completely abolished. She had some mild bruising at the needling sites but within expected limits. Pt was able to complete all exercises with minimal to no lasting increase in pain or discomfort. She has difficulty relaxing accessory muscles when performing exercises. Pt required cuing for proper technique and to facilitate improved neuromuscular control, strength, range of motion, and functional ability.     Rehab Potential  Good    Clinical Impairments Affecting Rehab Potential  (+) motivation, baseline fitness level and general health; (-) cardiac history, sternal precautions, unexpected condition behavior    PT Frequency  2x / week    PT Duration  6 weeks    PT Treatment/Interventions  ADLs/Self Care Home Management;Aquatic Therapy;Moist  Heat;Cryotherapy;Therapeutic activities;Therapeutic exercise;Neuromuscular re-education;Patient/family education;Manual techniques;Passive range of motion;Dry needling;Spinal Manipulations;Joint Manipulations;Other (comment)   joint  mobilizatoins grades I-V, therapeutic pain education   PT Next Visit Plan  Assess response to HEP, progress exercise as tolerated, initiate graded exercise and pain education, manual therapy targeting left UT. Consider repeating dry needling.      PT Home Exercise Plan  [employer] Access Code: [email or web address]    Consulted and Agree with Plan of Care  Patient       Patient will benefit from skilled therapeutic intervention in order to improve the following deficits and impairments:  Decreased endurance, Increased muscle spasms, Cardiopulmonary status limiting activity, Decreased range of motion, Impaired perceived functional ability, Pain, Impaired UE functional use, Impaired flexibility, Decreased activity tolerance  Visit Diagnosis: Cervicalgia  Other muscle spasm  Muscle weakness (generalized)     Problem List Patient Active Problem List   Diagnosis Date Noted  . 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 Coronary stent restenosis 08/24/2018  . Essential hypertension 10/06/2016  . CAD (coronary artery disease), native coronary artery 10/06/2016  . Status post coronary artery stent placement   . Unstable angina ([place])     Nancy Nordmann, PT, DPT 11/12/2018, 4:22 PM  [facility] [address] [place], [address] Phone: [phone]   Fax:  [phone]  Name: Kim Villarreal MRN: [id number] Date of Birth: 04-22-1971

## 2018-11-15 ENCOUNTER — Ambulatory Visit: Admitting: Physical Therapy

## 2018-11-15 DIAGNOSIS — M62838 Other muscle spasm: Secondary | ICD-10-CM

## 2018-11-15 DIAGNOSIS — M542 Cervicalgia: Secondary | ICD-10-CM

## 2018-11-15 DIAGNOSIS — M6281 Muscle weakness (generalized): Secondary | ICD-10-CM

## 2018-11-15 NOTE — Therapy (Signed)
[facility] [address], [place], [address] Phone: [phone]   Fax:  [phone]  Physical Therapy Treatment  Patient Details  Name: Kim Villarreal MRN: [id number] Date of Birth: [birth year] Referring Provider (PT): Lendon Colonel, [place]   Encounter Date: 11/15/2018  PT End of Session - 11/15/18 0952    Visit Number  3    Number of Visits  12    Date for PT Re-Evaluation  12/19/18    Authorization Type  [employer]    Authorization Time Period  current cert period: 51/76/1607 - 12/19/2018 (latest PN: IE 11/06/18)    Authorization - Visit Number  3    Authorization - Number of Visits  10    PT Start Time  [phone]    PT Stop Time  1035    PT Time Calculation (min)  48 min    Activity Tolerance  Patient tolerated treatment well;Patient limited by pain    Behavior During Therapy  [facility] for tasks assessed/performed       Past Medical History:  Diagnosis Date  . Anemia    "as a child"  . Anxiety   . Bursitis   . Childhood asthma   . Chronic lower back pain   . Coronary artery disease    a. 09/2016: cath showing 95% stenosis of the mid-LAD. Xience 2.5x17mm DES placed, remaining left and right coronary tree angiographically normal, lip swelling noted at the end of the case without rash or respiratory distress, treated with IV Solu-Medrol and Benadryl.  Marland Kitchen History of nuclear stress test    a. 07/2016: no sig ischemia, nl wall motion, EF 72%, small defect along mid to apical anteroseptal wall c/w breast attenuation artifact, low risk study  . Hypertension   . Migraine    "it was a bout; beginning of 2017; for ~ 1 month; went away" (05/23/2018)  . Mitral regurgitation    a. echo 08/2016: vigorous EF 65-70%, nl wall motion, nl LV diastolic fxn, mild MR  . Tendonitis     Past Surgical History:  Procedure Laterality Date  . CARDIAC CATHETERIZATION N/A 10/05/2016   Procedure: Left Heart Cath and Coronary  Angiography;  Surgeon: Wellington Hampshire, MD;  Location: [place];  Service: Cardiovascular;  Laterality: N/A;  . CARDIAC CATHETERIZATION N/A 10/05/2016   Procedure: Coronary Stent Intervention;  Surgeon: Wellington Hampshire, MD;  Location: [place] [facility];  Service: Cardiovascular;  Laterality: N/A;  . CESAREAN SECTION  2003; 2005  . CORONARY ANGIOPLASTY WITH STENT PLACEMENT  05/23/2018   "2 stents"  . CORONARY ARTERY BYPASS GRAFT N/A 08/27/2018   Procedure: CORONARY ARTERY BYPASS GRAFTING (CABG) x 1 using LEFT INTERNAL MAMMARY ARTERY;  Surgeon: Gaye Pollack, MD;  Location: [facility];  Service: Open Heart Surgery;  Laterality: N/A;  . CORONARY STENT INTERVENTION N/A 05/23/2018   Procedure: CORONARY STENT INTERVENTION;  Surgeon: Wellington Hampshire, MD;  Location: [facility];  Service: Cardiovascular;  Laterality: N/A;  mid LAD  . ECTOPIC PREGNANCY SURGERY  1991  . LEFT HEART CATH AND CORONARY ANGIOGRAPHY N/A 05/23/2018   Procedure: LEFT HEART CATH AND CORONARY ANGIOGRAPHY;  Surgeon: Wellington Hampshire, MD;  Location: [facility];  Service: Cardiovascular;  Laterality: N/A;  . LEFT HEART CATH AND CORONARY ANGIOGRAPHY N/A 08/22/2018   Procedure: LEFT HEART CATH AND CORONARY ANGIOGRAPHY;  Surgeon: Wellington Hampshire, MD;  Location: [facility];  Service: Cardiovascular;  Laterality: N/A;  . TEE WITHOUT CARDIOVERSION N/A 08/27/2018   Procedure: TRANSESOPHAGEAL ECHOCARDIOGRAM (TEE);  Surgeon: Gaye Pollack, MD;  Location: [facility];  Service: Open Heart Surgery;  Laterality: N/A;    There were no vitals filed for this visit.  Subjective Assessment - 11/15/18 0949    Subjective  Patient reports she is feeling pretty sore today after forgetting to take muscle relaxants last night. She has pain from the left suboccipital region to the upper thoracic spine on the left rated at 3/10. She states she was very busy yesterday cooking and eating with freinds and is pretty tired this morning.  She states her pain was pretty good yesterday.  She felt some typical soreness in the region follwoing her last treatment session. She did not have as many of her usual symptoms. The soreness lasted about 24 hours.  She has not done her HEP yesterday due to it being Christmas and being very busy.  Pt reported she did have the "ear aches" after she went to bed after last session and a little last night as well.  It does not affect hearing at all. It is about the same bilaterally, possible a "tad" worse on the left. Notes she has awoken with some tingling in the hands as well with her arm outstretched. Reported history of intermittant hand paresthesia that was waking her several times a night that has gotten better but was present prior to heart surgery.     Pertinent History  Patient is a 47 y.o. female who presents to outpatient physical therapy with a referral for medical diagnosis of musculoskeletal neck pain; s/p CABG x 1, coronary artery disease involving native coronary artery of native heart without angina pectoris. This patient's chief complaints consist of left sided neck pain that radiates from base of scull to lateral shoulder, and occasionally to right base of skull with onset with CABG surgery on 08/27/2018 (harvest site left mammary artery), leading to the following functional deficits: difficulty performing usual workouts, light aerobic exercise, reaching, using left arm for anything, shrugging shoulders, working, reduced quality of life.Relevant past medical history and comorbidities include history of 95% occlusion of LDA x 2 with failure of stent placement leading to CABG x 1, former smoker, chronic lower back pain, anxiety, mitral regurgitation, anxiety, history of headaches. Imaging: chest radiograph report from 08/29/2018: "IMPRESSION: Interval improvement in left lower lobe atelectasis and effusion"Response to previously administered skilled services: none for this problem, declined cardiac rehab  following CABG after poor experience following stent placement years ago (exercise was too easy).    Limitations  Lifting;Walking;House hold activities;Other (comment)    difficulty performing usual workouts, light aerobic exercise, reaching, using left arm for anything, shrugging shoulders, working, reduced quality of life.   Diagnostic tests  Imaging: chest radiograph report from 08/29/2018: "IMPRESSION: Interval improvement in left lower lobe atelectasis and effusion"    Patient Stated Goals  Return to previous level of actiity pain free    Currently in Pain?  Yes    Pain Score  3     Pain Location  Neck    Pain Orientation  Left    Pain Descriptors / Indicators  Shooting;Aching;Tightness;Sharp    Pain Type  Acute pain    Pain Onset  More than a month ago       TREATMENT:  Therapeutic exercise:to centralize symptoms and improve ROM and strength required for successful completion of functional activities.  - Upper body ergometerseated with no  added resistance toencourage joint nutrition, warm tissue, induce analgesic effect of aerobic exercise, improve muscular strength and endurance, and prepare for remainder of session. X 5 min durng subjective exam.  - supine chin tuck for deep neck flexor activation, 10 second hold over 3 min with extra time for cuing and practice to prevent SCM activation.  - supine bilateral SHLD horizontal abduction with scapular retraction for improved postural and shoulder girdle strengthening and mobility. Required instruction for technique and cuing to retract, posteriorly tilt, and depress scapulae as well as keep elbows extended. x15 green theraband.  - Supine diagonal 2 shoulder flexion pattern with scapular retraction for improved postural and shoulder girdle strengthening and mobility. Required instruction for technique and cuing to retract, posteriorly tilt, and depress scapulae.  x 15 ech side green theraband.  -  Seated lat pull, with isolated scapular  depression/elevation x 15 at 20#. Mild discomfort during elevation.    - Standing rows with scapular retraction for improved postural and shoulder girdle strengthening and mobility. Required instruction for technique and cuing to retract, posteriorly tilt, and depress scapulae.15#, x15.   - seated chest press with plus, x 15 at 5#. After attempting serratus press on wall and in quadruped. Quadruped caused reproduced pain for the entire motion, wall caused pain only with press, seated tolerable discomfort with press but also in anterior left shoulder.  - isometric cervical spine retraction against ball on wall, 5 second holds x 10.   -  Sidelying open book (thoracic rotation) to improve thoracic, shoulder girdle, and upper trunk mobility. Required instruction for technique and cuing to achieve end range as tolerated, hold time, and breathing technique. 5 second holds. X 10 each side. Initially some end range discomfort/stretch in upper sternum. Left side initially provoked some mild left ear discomfort, left concordant discomfort, left harvest site discomfort - all improved with repetition.  - Education on HEP including handout  Manual therapy:to reduce pain and tissue tension, improve range of motion, neuromodulation, in order to promote improved ability to complete functional activities. - STM with trigger point release L UT, and L levator (2) 14mm .30 needles placed along the L UT; with pincer grasp technique points with the patient positioned in (1) prone (2) supine, and (1) along L levator with patient in prone to decrease increased muscular spasms and trigger point tension. Good local twitch response noted. Patient was educated on risks and benefits of therapy and verbally consents to PT. Performed by Shelton Silvas, DPT   HOME EXERCISE PROGRAM Access Code: [email or web address]  URL: [email or web address]  Date: 11/15/2018  Prepared by: Rosita Kea   Exercises  Supine Chin Tuck - 20 reps  - 5 second hold - 3 Sets - 1x daily - 7x weekly  Seated Cervical Retraction - 10-15 reps - 5 second hold - 3 Sets - 1x daily - 7x weekly  Seated Upper Trapezius Stretch - 1-3 reps - 30 second hold - 1x daily - 3x weekly  Seated Levator Scapulae Stretch - 1-3 reps - 30 second hold - 1x daily - 3x weekly  Sub-Occipital Cervical Stretch - 1-3 reps - 30 second hold - 1x daily - 3x weekly  Standing Bilateral Low Shoulder Row with Anchored Resistance - 10-15 reps - 1 second hold - 3 Sets - 1x daily - 3x weekly  Sidelying Thoracic Rotation with Open Book - 10-15 reps - 5 second hold - 1-2 Sets - 1x daily - 7x weekly   Patient response to treatment:  Pt tolerated  treatment well. She tolerated all exercises with no increase in overall pain and actually reported feeling less tight by end of session. She reported feeling tighter overall today after forgetting to take her muscle relaxer. She found trigger point dry needling more uncomfortable today but got reproduction of painful symptoms and good twitch response during treatment. Patient expressed enjoyment of performing some scapular and upper extremity strengthening exercise today and had good tolerance progressed activities. She was encouraged to try anything we did today at home or at her gym depending on symptom response. Educated on appropriate response and rationale on implementing strengthening activities. Pt demo improved understanding and form during chin tuck. Briefly discussed history of paresthesia in B hands - may need to address more thoroughly in the future. Pt required cuing for proper technique and to facilitate improved neuromuscular control, strength, range of motion, and functional ability. Patient is making progress towards goals at this point.    PT Education - 11/15/18 0959    Education Details  exercise form/purpose. Verbal consent to dry needling. self-management.     Person(s) Educated  Patient    Methods   Explanation;Demonstration;Tactile cues;Verbal cues    Comprehension  Verbalized understanding;Returned demonstration       PT Short Term Goals - 11/12/18 1621      PT SHORT TERM GOAL #1   Title  Be independent with initial home exercise program for self-management of symptoms.    Baseline  Initial HEP provided 11/06/2018;    Time  2    Period  Weeks    Status  Achieved    Target Date  11/21/17        PT Long Term Goals - 11/07/18 1610      PT LONG TERM GOAL #1   Title  Have full left shoulder, scapula, and cervical spine AROM with no compensations or increase in pain in all planes except intermittent end range discomfort to allow patient to complete valued activities with less difficulty.     Baseline  see objective exam    Time  6    Period  Weeks    Status  New    Target Date  12/19/18      PT LONG TERM GOAL #2   Title  Complete community, work and/or recreational activities without limitation due to current condition.     Baseline  see subjective exam    Time  6    Period  Weeks    Status  New    Target Date  12/19/18      PT LONG TERM GOAL #3   Title  Improve left shoulder and scapula strength without pain to 5/5 for improved ability to allow patient to complete valued functional tasks such as reaching overhead and working out with less difficulty.     Baseline  see objective exam    Time  6    Period  Weeks    Status  New    Target Date  12/19/18      PT LONG TERM GOAL #4   Title  Be independent with a long-term home exercise program for self-management of symptoms.     Baseline  Initial HEP provided 11/06/2018    Time  6    Period  Weeks    Status  New    Target Date  12/19/18      PT LONG TERM GOAL #5   Title  Demonstrate improved FOTO score by 10 units to demonstrate improvement in overall  condition and self-reported functional ability.     Baseline  to be measured at visit 2    Time  6    Period  Weeks    Status  New    Target Date  12/19/18             Plan - 11/15/18 1055    Clinical Impression Statement  Pt tolerated treatment well. She tolerated all exercises with no increase in overall pain and actually reported feeling less tight by end of session. She reported feeling tighter overall today after forgetting to take her muscle relaxer. She found trigger point dry needling more uncomfortable today but got reproduction of painful symptoms and good twitch response during treatment. Patient expressed enjoyment of performing some scapular and upper extremity strengthening exercise today and had good tolerance progressed activities. She was encouraged to try anything we did today at home or at her gym depending on symptom response. Educated on appropriate response and rationale on implementing strengthening activities. Pt demo improved understanding and form during chin tuck. Briefly discussed history of paresthesia in B hands - may need to address more thoroughly in the future. Pt required cuing for proper technique and to facilitate improved neuromuscular control, strength, range of motion, and functional ability. Patient is making progress towards goals at this point.    Rehab Potential  Good    Clinical Impairments Affecting Rehab Potential  (+) motivation, baseline fitness level and general health; (-) cardiac history, sternal precautions, unexpected condition behavior    PT Frequency  2x / week    PT Duration  6 weeks    PT Treatment/Interventions  ADLs/Self Care Home Management;Aquatic Therapy;Moist Heat;Cryotherapy;Therapeutic activities;Therapeutic exercise;Neuromuscular re-education;Patient/family education;Manual techniques;Passive range of motion;Dry needling;Spinal Manipulations;Joint Manipulations;Other (comment)   joint mobilizatoins grades I-V, therapeutic pain education   PT Next Visit Plan  Assess response to HEP, progress exercise as tolerated, initiate graded exercise and pain education, manual therapy targeting left UT.  Consider repeating dry needling.  Exercises for decreased tension    PT Home Exercise Plan  Medbridge Access Code: [email or web address]    Consulted and Agree with Plan of Care  Patient       Patient will benefit from skilled therapeutic intervention in order to improve the following deficits and impairments:  Decreased endurance, Increased muscle spasms, Cardiopulmonary status limiting activity, Decreased range of motion, Impaired perceived functional ability, Pain, Impaired UE functional use, Impaired flexibility, Decreased activity tolerance  Visit Diagnosis: Cervicalgia  Other muscle spasm  Muscle weakness (generalized)     Problem List Patient Active Problem List   Diagnosis Date Noted  . 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 Coronary stent restenosis 08/24/2018  . Essential hypertension 10/06/2016  . CAD (coronary artery disease), native coronary artery 10/06/2016  . Status post coronary artery stent placement   . Unstable angina ([place])     Nancy Nordmann, PT, DPT 11/15/2018, 10:58 AM  [facility] [address] [place], [address] Phone: [phone]   Fax:  [phone]  Name: Kim Villarreal MRN: [id number] Date of Birth: 09-09-1971

## 2018-11-19 ENCOUNTER — Encounter: Admitting: Physical Therapy

## 2018-11-22 ENCOUNTER — Ambulatory Visit: Admitting: Physical Therapy

## 2018-11-26 ENCOUNTER — Encounter: Payer: Self-pay | Admitting: Physical Therapy

## 2018-11-26 ENCOUNTER — Telehealth: Payer: Self-pay | Admitting: Physical Therapy

## 2018-11-26 ENCOUNTER — Ambulatory Visit: Admitting: Physical Therapy

## 2018-11-26 DIAGNOSIS — M6281 Muscle weakness (generalized): Secondary | ICD-10-CM

## 2018-11-26 DIAGNOSIS — M542 Cervicalgia: Secondary | ICD-10-CM

## 2018-11-26 DIAGNOSIS — M62838 Other muscle spasm: Secondary | ICD-10-CM

## 2018-11-26 NOTE — Therapy (Signed)
[facility] [address], [place], [address] Phone: [phone]   Fax:  [phone]  Physical Therapy Discharge Summary  Reporting Period: 11/06/2018 - 11/26/2018  Patient Details  Name: Kim Villarreal MRN: [id number] Date of Birth: 07-22-1971 Referring Provider (PT): Lendon Colonel, [place]   Encounter Date: 11/26/2018    Past Medical History:  Diagnosis Date  . Anemia    "as a child"  . Anxiety   . Bursitis   . Childhood asthma   . Chronic lower back pain   . Coronary artery disease    a. 09/2016: cath showing 95% stenosis of the mid-LAD. Xience 2.5x52m DES placed, remaining left and right coronary tree angiographically normal, lip swelling noted at the end of the case without rash or respiratory distress, treated with IV Solu-Medrol and Benadryl.  .Marland KitchenHistory of nuclear stress test    a. 07/2016: no sig ischemia, nl wall motion, EF 72%, small defect along mid to apical anteroseptal wall c/w breast attenuation artifact, low risk study  . Hypertension   . Migraine    "it was a bout; beginning of 2017; for ~ 1 month; went away" (05/23/2018)  . Mitral regurgitation    a. echo 08/2016: vigorous EF 65-70%, nl wall motion, nl LV diastolic fxn, mild MR  . Tendonitis     Past Surgical History:  Procedure Laterality Date  . CARDIAC CATHETERIZATION N/A 10/05/2016   Procedure: Left Heart Cath and Coronary Angiography;  Surgeon: MWellington Hampshire MD;  [facility];  Service: Cardiovascular;  Laterality: N/A;  . CARDIAC CATHETERIZATION N/A 10/05/2016   Procedure: Coronary Stent Intervention;  Surgeon: MWellington Hampshire MD;  [facility];  Service: Cardiovascular;  Laterality: N/A;  . CESAREAN SECTION  2003; 2005  . CORONARY ANGIOPLASTY WITH STENT PLACEMENT  05/23/2018   "2 stents"  . CORONARY ARTERY BYPASS GRAFT N/A 08/27/2018   Procedure: CORONARY ARTERY BYPASS GRAFTING (CABG) x 1  using LEFT INTERNAL MAMMARY ARTERY;  Surgeon: BGaye Pollack MD;  [facility];  Service: Open Heart Surgery;  Laterality: N/A;  . CORONARY STENT INTERVENTION N/A 05/23/2018   Procedure: CORONARY STENT INTERVENTION;  Surgeon: AWellington Hampshire MD;  [facility];  Service: Cardiovascular;  Laterality: N/A;  mid LAD  . ECTOPIC PREGNANCY SURGERY  1991  . LEFT HEART CATH AND CORONARY ANGIOGRAPHY N/A 05/23/2018   Procedure: LEFT HEART CATH AND CORONARY ANGIOGRAPHY;  Surgeon: AWellington Hampshire MD;  [facility];  Service: Cardiovascular;  Laterality: N/A;  . LEFT HEART CATH AND CORONARY ANGIOGRAPHY N/A 08/22/2018   Procedure: LEFT HEART CATH AND CORONARY ANGIOGRAPHY;  Surgeon: AWellington Hampshire MD;  [facility];  Service: Cardiovascular;  Laterality: N/A;  . TEE WITHOUT CARDIOVERSION N/A 08/27/2018   Procedure: TRANSESOPHAGEAL ECHOCARDIOGRAM (TEE);  Surgeon: BGaye Pollack MD;  [facility]  Service: Open Heart Surgery;  Laterality: N/A;    There were no vitals filed for this visit.  Subjective Assessment - 11/26/18 1606    Subjective  Patient called and spoke to front office staff today. Self-discharged due to feeling better.     Pertinent History  Patient is a 48y.o. female who presents to outpatient physical therapy with a referral for medical diagnosis of musculoskeletal neck pain; s/p CABG x 1, coronary artery disease involving native coronary artery of native heart without angina pectoris. This patient's chief complaints  consist of left sided neck pain that radiates from base of scull to lateral shoulder, and occasionally to right base of skull with onset with CABG surgery on 08/27/2018 (harvest site left mammary artery), leading to the following functional deficits: difficulty performing usual workouts, light aerobic exercise, reaching, using left arm for anything, shrugging shoulders, working, reduced quality of life.Relevant past medical  history and comorbidities include history of 95% occlusion of LDA x 2 with failure of stent placement leading to CABG x 1, former smoker, chronic lower back pain, anxiety, mitral regurgitation, anxiety, history of headaches. Imaging: chest radiograph report from 08/29/2018: "IMPRESSION: Interval improvement in left lower lobe atelectasis and effusion"Response to previously administered skilled services: none for this problem, declined cardiac rehab following CABG after poor experience following stent placement years ago (exercise was too easy).    Limitations  Lifting;Walking;House hold activities;Other (comment)    difficulty performing usual workouts, light aerobic exercise, reaching, using left arm for anything, shrugging shoulders, working, reduced quality of life.   Diagnostic tests  Imaging: chest radiograph report from 08/29/2018: "IMPRESSION: Interval improvement in left lower lobe atelectasis and effusion"    Patient Stated Goals  Return to previous level of actiity pain free    Pain Onset  More than a month ago        OBJECTIVE:  Pt not present for exam. Please see prior documentation for latest objective measures.      PT Short Term Goals - 11/12/18 1621      PT SHORT TERM GOAL #1   Title  Be independent with initial home exercise program for self-management of symptoms.    Baseline  Initial HEP provided 11/06/2018;    Time  2    Period  Weeks    Status  Achieved    Target Date  11/21/17        PT Long Term Goals - 11/26/18 1607      PT LONG TERM GOAL #1   Title  Have full left shoulder, scapula, and cervical spine AROM with no compensations or increase in pain in all planes except intermittent end range discomfort to allow patient to complete valued activities with less difficulty.     Baseline  see objective exam    Time  6    Period  Weeks    Status  Unable to assess    Target Date  12/19/18      PT LONG TERM GOAL #2   Title  Complete community, work and/or  recreational activities without limitation due to current condition.     Baseline  see subjective exam    Time  6    Period  Weeks    Status  Unable to assess    Target Date  12/19/18      PT LONG TERM GOAL #3   Title  Improve left shoulder and scapula strength without pain to 5/5 for improved ability to allow patient to complete valued functional tasks such as reaching overhead and working out with less difficulty.     Baseline  see objective exam    Time  6    Period  Weeks    Status  Unable to assess    Target Date  12/19/18      PT LONG TERM GOAL #4   Title  Be independent with a long-term home exercise program for self-management of symptoms.     Baseline  Initial HEP provided 11/06/2018    Time  6    Period  Weeks    Status  Unable to assess    Target Date  12/19/18      PT LONG TERM GOAL #5   Title  Demonstrate improved FOTO score by 10 units to demonstrate improvement in overall condition and self-reported functional ability.     Baseline  to be measured at visit 2    Time  6    Period  Weeks    Status  Unable to assess    Target Date  12/19/18            Plan - 11/26/18 1611    Clinical Impression Statement  Patient attended 3 physical therapy treatment sessions this episode of care before self-discharging due to self-report of resolution of pain and dysfunction. Patient met her short term goal and her long term goals were unable to be assessed by physical therapist. Over the course of her treatment patient made progress towards goals. Patient is now discharged with advice to gradually return to prior level of function and workout routine.     Rehab Potential  Good    Clinical Impairments Affecting Rehab Potential  (+) motivation, baseline fitness level and general health; (-) cardiac history, sternal precautions, unexpected condition behavior    PT Frequency  2x / week    PT Duration  6 weeks    PT Treatment/Interventions  ADLs/Self Care Home Management;Aquatic  Therapy;Moist Heat;Cryotherapy;Therapeutic activities;Therapeutic exercise;Neuromuscular re-education;Patient/family education;Manual techniques;Passive range of motion;Dry needling;Spinal Manipulations;Joint Manipulations;Other (comment)   joint mobilizatoins grades I-V, therapeutic pain education   PT Next Visit Plan  Patient is now discharged from physical therapy due to self-report of no longer having pain or dysfunction.    PT Home Exercise Plan  Medbridge Access Code: [email or web address]    Consulted and Agree with Plan of Care  Patient       Patient will benefit from skilled therapeutic intervention in order to improve the following deficits and impairments:  Decreased endurance, Increased muscle spasms, Cardiopulmonary status limiting activity, Decreased range of motion, Impaired perceived functional ability, Pain, Impaired UE functional use, Impaired flexibility, Decreased activity tolerance  Visit Diagnosis: Cervicalgia  Other muscle spasm  Muscle weakness (generalized)     Problem List Patient Active Problem List   Diagnosis Date Noted  . 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 Coronary stent restenosis 08/24/2018  . Essential hypertension 10/06/2016  . CAD (coronary artery disease), native coronary artery 10/06/2016  . Status post coronary artery stent placement   . Unstable angina ([place])     Nancy Nordmann, PT, DPT 11/26/2018, 4:12 PM  [facility] [address], [place], [address] Phone: [phone]   Fax:  [phone]  Name: Kim Villarreal MRN: [id number] Date of Birth: [birth year]

## 2018-11-26 NOTE — Telephone Encounter (Signed)
Called pt at cell/home number to touch bases with her after she called to self-discharge due to feeling better. Left a voicemail wishing her well.

## 2018-11-28 ENCOUNTER — Ambulatory Visit: Admitting: Physical Therapy

## 2018-12-04 ENCOUNTER — Encounter: Admitting: Physical Therapy

## 2018-12-06 ENCOUNTER — Encounter: Admitting: Physical Therapy

## 2018-12-11 ENCOUNTER — Encounter: Admitting: Physical Therapy

## 2018-12-18 ENCOUNTER — Encounter: Admitting: Physical Therapy

## 2018-12-20 ENCOUNTER — Encounter: Admitting: Physical Therapy

## 2018-12-28 ENCOUNTER — Other Ambulatory Visit: Payer: Self-pay | Admitting: Cardiovascular Disease

## 2018-12-28 ENCOUNTER — Encounter: Payer: Self-pay | Admitting: Cardiovascular Disease

## 2018-12-28 ENCOUNTER — Ambulatory Visit (INDEPENDENT_AMBULATORY_CARE_PROVIDER_SITE_OTHER): Admitting: Cardiovascular Disease

## 2018-12-28 VITALS — BP 126/62 | HR 72 | Ht 66.5 in | Wt 148.0 lb

## 2018-12-28 DIAGNOSIS — E785 Hyperlipidemia, unspecified: Secondary | ICD-10-CM | POA: Diagnosis not present

## 2018-12-28 DIAGNOSIS — I10 Essential (primary) hypertension: Secondary | ICD-10-CM | POA: Diagnosis not present

## 2018-12-28 DIAGNOSIS — I2510 Atherosclerotic heart disease of native coronary artery without angina pectoris: Secondary | ICD-10-CM

## 2018-12-28 MED ORDER — ROSUVASTATIN CALCIUM 5 MG PO TABS: 5.0000 mg | ORAL_TABLET | Freq: Every day | ORAL | 3 refills | Status: DC

## 2018-12-28 MED ORDER — ASPIRIN 325 MG PO TBEC: 325.0000 mg | DELAYED_RELEASE_TABLET | Freq: Every day | ORAL | Status: AC

## 2018-12-28 MED ORDER — ROSUVASTATIN CALCIUM 5 MG PO TABS: 5.0000 mg | ORAL_TABLET | Freq: Every day | ORAL | 0 refills | Status: DC

## 2018-12-28 NOTE — Progress Notes (Signed)
Cardiology Office Note   Date:  12/28/2018   ID:  Kim Villarreal, DOB [birth year], MRN [id number]  PCP:  Derinda Late, MD  Cardiologist:   [name], MD   Chief Complaint  Patient presents with  . Other    3 month follow up. Patient c/o chest tightness and SOB. Meds reviewed verbally with patient.       History of Present Illness: Kim Villarreal is a 48 y.o. female who presents for a follow-up visit regarding coronary artery disease.  She is a previous smoker . She has no diabetes or hyperlipidemia.  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 She did have a lot of postoperative pain especially in her neck that subsequently improved.  She resumed most of her exercise recently but was somewhat anxious after she had mild chest discomfort twice.  This chest pain did not happen any other times even with intense activities.  Past Medical History:  Diagnosis Date  . Anemia    "as a child"  . Anxiety   . Bursitis   . Childhood asthma   . Chronic lower back pain   . Coronary artery disease    a. 09/2016: cath showing 95% stenosis of the mid-LAD. Xience 2.5x38mm DES placed, remaining left and right coronary tree angiographically normal, lip swelling noted at the end of the case without rash or respiratory distress, treated with IV Solu-Medrol and Benadryl.  Marland Kitchen History of nuclear stress test    a. 07/2016: no sig ischemia, nl wall motion, EF 72%, small defect along mid to apical anteroseptal wall c/w breast attenuation artifact, low risk study  . Hypertension   . Migraine    "it was a bout; beginning of 2017; for ~ 1 month; went away" (05/23/2018)  . Mitral regurgitation    a. echo 08/2016: vigorous EF 65-70%, nl wall motion, nl LV diastolic fxn, mild MR  . Tendonitis     Past Surgical History:  Procedure Laterality Date  . CARDIAC CATHETERIZATION N/A 10/05/2016   Procedure: Left Heart Cath and Coronary  Angiography;  Surgeon: Wellington Hampshire, MD;  Location: [facility];  Service: Cardiovascular;  Laterality: N/A;  . CARDIAC CATHETERIZATION N/A 10/05/2016   Procedure: Coronary Stent Intervention;  Surgeon: Wellington Hampshire, MD;  Location: [facility];  Service: Cardiovascular;  Laterality: N/A;  . CESAREAN SECTION  2003; 2005  . CORONARY ANGIOPLASTY WITH STENT PLACEMENT  05/23/2018   "2 stents"  . CORONARY ARTERY BYPASS GRAFT N/A 08/27/2018   Procedure: CORONARY ARTERY BYPASS GRAFTING (CABG) x 1 using LEFT INTERNAL MAMMARY ARTERY;  Surgeon: Gaye Pollack, MD;  Location: [facility];  Service: Open Heart Surgery;  Laterality: N/A;  . CORONARY STENT INTERVENTION N/A 05/23/2018   Procedure: CORONARY STENT INTERVENTION;  Surgeon: Wellington Hampshire, MD;  Location: [facility];  Service: Cardiovascular;  Laterality: N/A;  mid LAD  . ECTOPIC PREGNANCY SURGERY  1991  . LEFT HEART CATH AND CORONARY ANGIOGRAPHY N/A 05/23/2018   Procedure: LEFT HEART CATH AND CORONARY ANGIOGRAPHY;  Surgeon: Wellington Hampshire, MD;  Location: [facility];  Service: Cardiovascular;  Laterality: N/A;  . LEFT HEART CATH AND CORONARY ANGIOGRAPHY N/A 08/22/2018   Procedure: LEFT HEART CATH AND CORONARY ANGIOGRAPHY;  Surgeon: Wellington Hampshire, MD;  Location: [facility];  Service: Cardiovascular;  Laterality: N/A;  . TEE WITHOUT CARDIOVERSION N/A 08/27/2018   Procedure:  TRANSESOPHAGEAL ECHOCARDIOGRAM (TEE);  Surgeon: Gaye Pollack, MD;  Location: [facility];  Service: Open Heart Surgery;  Laterality: N/A;     Current Outpatient Medications  Medication Sig Dispense Refill  . ALPRAZolam (XANAX) 0.25 MG tablet Take 1 tablet (0.25 mg total) by mouth daily as needed for anxiety. 30 tablet 0  . losartan (COZAAR) 50 MG tablet Take 50 mg by mouth daily.     . nitrofurantoin (MACRODANTIN) 25 MG capsule Take 25 mg by mouth daily as needed (for UTI prevention).     . Omega-3 Fatty Acids (FISH OIL PO) Take 1 capsule  by mouth daily.     . Probiotic Product (PROBIOTIC PO) Take 1 capsule by mouth daily.    . rosuvastatin (CRESTOR) 5 MG tablet Take 1 tablet (5 mg total) by mouth daily. 90 tablet 3   No current facility-administered medications for this visit.     Allergies:   Codeine and Contrast media [iodinated diagnostic agents]    Social History:  The patient  reports that she quit smoking about 13 years ago. Her smoking use included cigarettes. She has a 11.25 pack-year smoking history. She has never used smokeless tobacco. She reports current alcohol use. She reports that she does not use drugs.   Family History:  The patient's family history includes Heart disease in her mother.    ROS:  Please see the history of present illness.   Otherwise, review of systems are positive for none.   All other systems are reviewed and negative.    PHYSICAL EXAM: VS:  BP 126/62 (BP Location: Left Arm, Patient Position: Sitting, Cuff Size: Normal)   Pulse 72   Ht 5' 6.5" (1.689 m)   Wt 148 lb (67.1 kg)   BMI 23.53 kg/m  , BMI Body mass index is 23.53 kg/m. GEN: Well nourished, well developed, in no acute distress  HEENT: normal  Neck: no JVD, carotid bruits, or masses Cardiac: RRR; no  rubs, or gallops,no edema . 2/6 systolic ejection murmur in the aortic area. Respiratory:  clear to auscultation bilaterally, normal work of breathing GI: soft, nontender, nondistended, + BS MS: no deformity or atrophy  Skin: warm and dry, no rash Neuro:  Strength and sensation are intact Psych: euthymic mood, full affect Femoral pulses diminished bilaterally.  EKG:  EKG is ordered today. The ekg ordered today demonstrates normal sinus rhythm with a PVC.  Old septal infarct and possible left atrial enlargement.  Recent Labs: 08/28/2018: Magnesium 2.2 09/17/2018: ALT 19; BUN 12; Creatinine, Ser 0.81; Hemoglobin 11.5; Platelets 286; Potassium 4.8; Sodium 141    Lipid Panel    Component Value Date/Time   CHOL 137  09/17/2018 1051   TRIG 145 09/17/2018 1051   HDL 47 09/17/2018 1051   CHOLHDL 2.9 09/17/2018 1051   CHOLHDL 2.8 03/09/2017 0855   VLDL 11 03/09/2017 0855   LDLCALC 61 09/17/2018 1051   LDLDIRECT 61 09/17/2018 1051      Wt Readings from Last 3 Encounters:  12/28/18 148 lb (67.1 kg)  10/01/18 143 lb 3.2 oz (65 kg)  09/17/18 139 lb 4 oz (63.2 kg)      PAD Screen 08/05/2016  Previous PAD dx? No  Previous surgical procedure? No  Pain with walking? No  Feet/toe relief with dangling? No  Painful, non-healing ulcers? No  Extremities discolored? No      ASSESSMENT AND PLAN:  1.  Coronary artery disease involving native coronary arteries without angina: She is status post one-vessel CABG  with LIMA to LAD in October 2019.   I advised her to increase aspirin to 325 mg once daily. I discussed with her ways of coping with increased stress after all her cardiac events.  She already follows a healthy diet.  She is concerned about the weight gain she had but she seems to have a good plan to get back on track.  2. Hyperlipidemia: She is tolerating small dose rosuvastatin.  Most recent lipid profile showed an LDL of 61.  3. Essential hypertension: Blood pressure is reasonably controlled on losartan.   Disposition:   FU with me in 6 months.  Signed,  [name], MD  12/28/2018 1:41 PM    [place]

## 2018-12-28 NOTE — Patient Instructions (Signed)
Medication Instructions:  INCREASE Aspirin to 325 mg daily If you need a refill on your cardiac medications before your next appointment, please call your pharmacy.   Lab work: None ordered  Testing/Procedures: None ordered  Follow-Up: At [facility], you and your health needs are our priority.  As part of our continuing mission to provide you with exceptional heart care, we have created designated Provider Care Teams.  These Care Teams include your primary Cardiologist (physician) and Advanced Practice Providers (APPs -  Physician Assistants and Nurse Practitioners) who all work together to provide you with the care you need, when you need it. You will need a follow up appointment in 6 months.  Please call our office 2 months in advance to schedule this appointment.  You may see Dr. Fletcher Anon or one of the following Advanced Practice Providers on your designated Care Team:   Murray Hodgkins, NP Christell Faith, PA-C . Marrianne Mood, PA-C

## 2019-03-15 ENCOUNTER — Telehealth: Payer: Self-pay | Admitting: Cardiovascular Disease

## 2019-03-15 NOTE — Telephone Encounter (Signed)
°*  STAT* If patient is at the pharmacy, call can be transferred to refill team.   1. Which medications need to be refilled? (please list name of each medication and dose if known) ALPRAZolam (XANAX) 0.25 MG - 1 tablet as needed   2. Which pharmacy/location (including street and city if local pharmacy) is medication to be sent to? [facility]   3. Do they need a 30 day or 90 day supply? 90 day if possible

## 2019-03-15 NOTE — Telephone Encounter (Signed)
Please advise if ok to refill not cardiac medication last prescribed Dunn, PA

## 2019-03-15 NOTE — Telephone Encounter (Signed)
Per Christell Faith, PA documentation  RLS: Refilled Xanax x1.  Further refills will need to come from PCP.

## 2019-03-25 NOTE — Telephone Encounter (Signed)
Looks like she originally requested this refill 03/15/2019 and it was sent to triage.  Patient calling today checking the states.  Looks like after [employer], Therapist, sports documentation from [employer], [place]. This medication was never sent in for patient.  Can you please review and see if I can go ahead and sent this medication in for the patient.  Patient is requesting it go to [facility].

## 2019-03-25 NOTE — Telephone Encounter (Signed)
This was refilled by Christell Faith, PA in 08/2018 for # 30 tablets w/ no refills.   Per office note from [place], [place] 09/17/18: 4. RLS: Refilled Xanax x1.  Further refills will need to come from PCP.  Check CBC, iron, and ferritin.   Further refills will need to come from her PCP- it looks like Valinda Hoar. Already sent this back to [facility] on 4/24.

## 2019-03-25 NOTE — Telephone Encounter (Signed)
Patient calling to check on status  *STAT* If patient is at the pharmacy, call can be transferred to refill team.   1. Which medications need to be refilled? (please list name of each medication and dose if known) ALPRAZolam (XANAX) 0.25 MG - 1 tablet as needed   2. Which pharmacy/location (including street and city if local pharmacy) is medication to be sent to? [facility] [address]   3. Do they need a 30 day or 90 day supply? 90 day if possible

## 2019-05-07 ENCOUNTER — Telehealth: Payer: Self-pay | Admitting: Cardiovascular Disease

## 2019-05-07 NOTE — H&P (View-Only) (Signed)
Cardiology Office Note Date:  05/09/2019  Patient ID:  Kim Villarreal, Kim Villarreal 06/27/1971, MRN [id number] PCP:  Derinda Late, MD  Cardiologist:  Dr. Fletcher Anon, MD    Chief Complaint: Chest pain  History of Present Illness: Kim Villarreal is a 48 y.o. female with history of CAD s/p PCI/DES to the mid LAD in 2017 s/p PCI/DESto the LAD due to ISR as below in 05/2018 s/p 1-vessel CABG on 08/27/2018 with a LIMA to LAD, carotid artery disease with 76 to 59% left internal carotid artery stenosis by carotid ultrasound in 2019, prior tobacco abuse quitting ~ 10 years prior, family history of CAD, HTN, mitral regurgitation, and migraine disorder who presents for evaluation of chest discomfort.   Patient underwent Myoview in 07/2016 for chest pain that noted a small region of fixed perfusion defect in the mid to apical anteroseptal region consistent with breast attenuation artifact, overall low risk. She continued to note chest pain and underwent LHC in 09/2016 that showed 95% stenosis in the mid LAD which was successfully treated with PCI/DES. She was noted to have severe myalgias with Lipitor, though seemed to be tolerating Crestor. She was seen in 08/2017, noting intermittent exertional chest tightness and was prescribed Toprol; however she did not take this given that her symptoms had resolved without intervention. She was seen on 05/22/2018 noting a 3 week history of intermittent episodes of chest tightness radiating to both arms, though mostly the right arm. Initially, this was noted after an intense boot camp, though began to occur with less intense activities as well. These symptoms were similar to her pain prior to her above PCI in 2017. She underwent diagnostic LHC on 05/23/2018 that showed severe one-vessel CAD due to ISR in the proximal to mid LAD. No other obstructive disease was noted with the rest of the coronary arteries looking normal. LVEDP was normal. She underwent successful PCI/DES to the LAD for  ISR. An additional overlapping stent was placed distally due to suspected edge dissection. She was seen again in 07/2018 noting return of angina x 3 weeks similar to her prior presentation. She underwent repeat LHC on 08/22/2018 that showed severe one-vessel CAD with proximal to mid LAD stenosis of 95% due to ISR in the mid LAD. Otherwise, there was no obstructive disease with normal LVSF and LVEDP. Given this was the 2nd time for ISR in the LAD despite using 2 different drug-eluting stents, with the most recent stent being placed just 3 months prior, CABG was recommended. P2y12 was 177, indicating Plavix was working for her. She underwent successful one-vessel CABG on 08/27/2018 with a LIMA to LAD.  Her postoperative course was noted to involve a significant amount of postoperative pain especially in the neck with subsequent improvement.  She was most recently seen by Dr. Fletcher Anon on 12/28/2018 and noted she had resumed her most of her exercise regimen though was somewhat anxious after she had developed mild chest discomfort twice.  Patient contacted our office on 05/07/2019 noticing a several week history while running her forearms began to ache followed by aching along the posterior left shoulder and left axilla.  There was no associated chest tightness.  Following the development of the symptoms that she began to decrease her exercise regimen though was now having symptoms at rest including pain in her front teeth.  She indicated symptoms were similar to what she had experienced in the past though not exactly the same.  She comes in today, noting for the past week  the development of intermittent bilateral arm pain as well as upper bilateral chest discomfort that is not described as tightness or pain.  This discomfort at times has radiated superiorly to the left side of her neck, left jaw, and into the front teeth.  Symptoms feel exactly the same as her prior presentation leading to PCI and CABG as above.  However,  this time she has presented prior to the development of chest pain.  She also notes pinpoint pain along the palm of the right hand.  She is currently symptom-free.  No associated shortness of breath, dizziness, palpitations, presyncope, or syncope.  She is compliant with all medications.     Labs: 09/09/2018- WBC 5.5, Hgb 11.5, PLT 286, serum creatinine 0.1, potassium 4.8, albumin 4.3, AST/ALT normal, triglycerides 145, LDL 61, ferritin 35, iron 36  Past Medical History:  Diagnosis Date   Anemia    "as a child"   Anxiety    Bursitis    Childhood asthma    Chronic lower back pain    Coronary artery disease    a. 09/2016: cath showing 95% stenosis of the mid-LAD. Xience 2.5x42mm DES placed, remaining left and right coronary tree angiographically normal, lip swelling noted at the end of the case without rash or respiratory distress, treated with IV Solu-Medrol and Benadryl.   History of nuclear stress test    a. 07/2016: no sig ischemia, nl wall motion, EF 72%, small defect along mid to apical anteroseptal wall c/w breast attenuation artifact, low risk study   Hypertension    Migraine    "it was a bout; beginning of 2017; for ~ 1 month; went away" (05/23/2018)   Mitral regurgitation    a. echo 08/2016: vigorous EF 65-70%, nl wall motion, nl LV diastolic fxn, mild MR   Tendonitis     Past Surgical History:  Procedure Laterality Date   CARDIAC CATHETERIZATION N/A 10/05/2016   Procedure: Left Heart Cath and Coronary Angiography;  Surgeon: Wellington Hampshire, MD;  Location: [facility];  Service: Cardiovascular;  Laterality: N/A;   CARDIAC CATHETERIZATION N/A 10/05/2016   Procedure: Coronary Stent Intervention;  Surgeon: Wellington Hampshire, MD;  Location: [facility];  Service: Cardiovascular;  Laterality: N/A;   CESAREAN SECTION  2003; 2005   CORONARY ANGIOPLASTY WITH STENT PLACEMENT  05/23/2018   "2 stents"   CORONARY ARTERY BYPASS GRAFT N/A 08/27/2018    Procedure: CORONARY ARTERY BYPASS GRAFTING (CABG) x 1 using LEFT INTERNAL MAMMARY ARTERY;  Surgeon: Gaye Pollack, MD;  Location: [facility];  Service: Open Heart Surgery;  Laterality: N/A;   CORONARY STENT INTERVENTION N/A 05/23/2018   Procedure: CORONARY STENT INTERVENTION;  Surgeon: Wellington Hampshire, MD;  Location: [facility];  Service: Cardiovascular;  Laterality: N/A;  mid LAD   ECTOPIC PREGNANCY SURGERY  1991   LEFT HEART CATH AND CORONARY ANGIOGRAPHY N/A 05/23/2018   Procedure: LEFT HEART CATH AND CORONARY ANGIOGRAPHY;  Surgeon: Wellington Hampshire, MD;  Location: [facility];  Service: Cardiovascular;  Laterality: N/A;   LEFT HEART CATH AND CORONARY ANGIOGRAPHY N/A 08/22/2018   Procedure: LEFT HEART CATH AND CORONARY ANGIOGRAPHY;  Surgeon: Wellington Hampshire, MD;  Location: [facility];  Service: Cardiovascular;  Laterality: N/A;   TEE WITHOUT CARDIOVERSION N/A 08/27/2018   Procedure: TRANSESOPHAGEAL ECHOCARDIOGRAM (TEE);  Surgeon: Gaye Pollack, MD;  Location: [facility];  Service: Open Heart Surgery;  Laterality: N/A;    Current Meds  Medication Sig  ALPRAZolam (XANAX) 0.25 MG tablet Take 1 tablet (0.25 mg total) by mouth daily as needed for anxiety.   aspirin 325 MG EC tablet Take 1 tablet (325 mg total) by mouth daily.   cholecalciferol (VITAMIN D3) 25 MCG (1000 UT) tablet Take 10,000 Units by mouth daily.   losartan (COZAAR) 50 MG tablet Take 50 mg by mouth daily.    nitrofurantoin (MACRODANTIN) 25 MG capsule Take 25 mg by mouth daily as needed (for UTI prevention).    rosuvastatin (CRESTOR) 5 MG tablet TAKE 1 TABLET(5 MG) BY MOUTH DAILY    Allergies:   Codeine and Contrast media [iodinated diagnostic agents]   Social History:  The patient  reports that she quit smoking about 13 years ago. Her smoking use included cigarettes. She has a 11.25 pack-year smoking history. She has never used smokeless tobacco. She reports current alcohol use. She reports that she  does not use drugs.   Family History:  The patient's family history includes Heart disease in her mother; Parkinson's disease in her father.  ROS:   Review of Systems  Constitutional: Positive for malaise/fatigue. Negative for chills, diaphoresis, fever and weight loss.  HENT: Negative for congestion.   Eyes: Negative for discharge and redness.  Respiratory: Negative for cough, hemoptysis, sputum production, shortness of breath and wheezing.   Cardiovascular: Positive for chest pain. Negative for palpitations, orthopnea, claudication, leg swelling and PND.  Gastrointestinal: Negative for abdominal pain, blood in stool, heartburn, melena, nausea and vomiting.  Genitourinary: Negative for hematuria.  Musculoskeletal: Positive for myalgias. Negative for falls.  Skin: Negative for rash.  Neurological: Positive for weakness. Negative for dizziness, tingling, tremors, sensory change, speech change, focal weakness and loss of consciousness.  Endo/Heme/Allergies: Does not bruise/bleed easily.  Psychiatric/Behavioral: Negative for substance abuse. The patient is not nervous/anxious.   All other systems reviewed and are negative.    PHYSICAL EXAM:  VS:  BP 120/82 (BP Location: Left Arm, Patient Position: Sitting, Cuff Size: Normal)    Pulse 70    Ht 5' 6.53" (1.69 m)    Wt 150 lb 4 oz (68.2 kg)    SpO2 96%    BMI 23.87 kg/m  BMI: Body mass index is 23.87 kg/m.  Physical Exam  Constitutional: She is oriented to person, place, and time. She appears well-developed and well-nourished.  HENT:  Head: Normocephalic and atraumatic.  Eyes: Right eye exhibits no discharge. Left eye exhibits no discharge.  Neck: Normal range of motion. No JVD present.  Cardiovascular: Normal rate, regular rhythm, S1 normal, S2 normal and normal heart sounds. Exam reveals no distant heart sounds, no friction rub, no midsystolic click and no opening snap.  No murmur heard. Pulses:      Dorsalis pedis pulses are 2+ on the  right side and 2+ on the left side.       Posterior tibial pulses are 2+ on the right side and 2+ on the left side.  Pulmonary/Chest: Effort normal and breath sounds normal. No respiratory distress. She has no decreased breath sounds. She has no wheezes. She has no rales. She exhibits no tenderness.  Abdominal: Soft. She exhibits no distension. There is no abdominal tenderness.  Musculoskeletal:        General: No edema.  Neurological: She is alert and oriented to person, place, and time.  Skin: Skin is warm and dry. No cyanosis. Nails show no clubbing.  Psychiatric: She has a normal mood and affect. Her speech is normal and behavior is normal. Judgment  and thought content normal.     EKG:  Was ordered and interpreted by me today. Shows NSR, 67 bpm, old septal infarct, no acute st/t changes   Recent Labs: 08/28/2018: Magnesium 2.2 09/17/2018: ALT 19; BUN 12; Creatinine, Ser 0.81; Hemoglobin 11.5; Platelets 286; Potassium 4.8; Sodium 141  09/17/2018: Chol/HDL Ratio 2.9; Cholesterol, Total 137; HDL 47; LDL Calculated 61; LDL Direct 61; Triglycerides 145   CrCl cannot be calculated (Patient's most recent lab result is older than the maximum 21 days allowed.).   Wt Readings from Last 3 Encounters:  05/09/19 150 lb 4 oz (68.2 kg)  12/28/18 148 lb (67.1 kg)  10/01/18 143 lb 3.2 oz (65 kg)     Other studies reviewed: Additional studies/records reviewed today include: summarized above  ASSESSMENT AND PLAN:  1. CAD involving the native coronary arteries status post 1-vessel CABG with accelerating angina: Currently chest pain-free.  Symptoms feel very similar to her prior presentation leading to multiple PCI with subsequent CABG as outlined above.  Scheduled for cardiac catheterization.  Discussed escalation of antianginal therapy with isosorbide mononitrate though this was deferred at this time. Continue full-dose ASA. Risks and benefits of cardiac catheterization have been discussed with the  patient including risks of bleeding, bruising, infection, kidney damage, stroke, heart attack, and death. The patient understands these risks and is willing to proceed with the procedure. All questions have been answered and concerns listened to.  She will be treated for contrast allergy prior to procedure with prednisone 50 mg every 6 hours x3 doses the day before her procedure followed by Benadryl 50 mg p.o. 1 hour prior to procedure.  2. HLD: LDL of 61 from 08/2018 with a goal LDL < 70. Tolerating Crestor.   3. HTN: Blood pressure is well controlled at her visit today. Continue losartan.   Disposition: F/u with Dr. Fletcher Anon or an APP in 3 weeks.  Current medicines are reviewed at length with the patient today.  The patient did not have any concerns regarding medicines.  Signed, Christell Faith, PA-C 05/09/2019 9:02 AM     [facility] [address] [place] [address] [place], [place] [address] [phone]

## 2019-05-07 NOTE — Telephone Encounter (Signed)
Agree with in person evaluation.  If symptoms worsen or become more concerning to the patient recommend she proceed to the ED.

## 2019-05-07 NOTE — Progress Notes (Signed)
Cardiology Office Note Date:  05/09/2019  Patient ID:  Kim Villarreal, Kim Villarreal Jul 06, 1971, MRN [id number] PCP:  Derinda Late, MD  Cardiologist:  Dr. Fletcher Anon, MD    Chief Complaint: Chest pain  History of Present Illness: Kim Villarreal is a 48 y.o. female with history of CAD s/p PCI/DES to the mid LAD in 2017 s/p PCI/DESto the LAD due to ISR as below in 05/2018 s/p 1-vessel CABG on 08/27/2018 with a LIMA to LAD, carotid artery disease with 13 to 59% left internal carotid artery stenosis by carotid ultrasound in 2019, prior tobacco abuse quitting ~ 10 years prior, family history of CAD, HTN, mitral regurgitation, and migraine disorder who presents for evaluation of chest discomfort.   Patient underwent Myoview in 07/2016 for chest pain that noted a small region of fixed perfusion defect in the mid to apical anteroseptal region consistent with breast attenuation artifact, overall low risk. She continued to note chest pain and underwent LHC in 09/2016 that showed 95% stenosis in the mid LAD which was successfully treated with PCI/DES. She was noted to have severe myalgias with Lipitor, though seemed to be tolerating Crestor. She was seen in 08/2017, noting intermittent exertional chest tightness and was prescribed Toprol; however she did not take this given that her symptoms had resolved without intervention. She was seen on 05/22/2018 noting a 3 week history of intermittent episodes of chest tightness radiating to both arms, though mostly the right arm. Initially, this was noted after an intense boot camp, though began to occur with less intense activities as well. These symptoms were similar to her pain prior to her above PCI in 2017. She underwent diagnostic LHC on 05/23/2018 that showed severe one-vessel CAD due to ISR in the proximal to mid LAD. No other obstructive disease was noted with the rest of the coronary arteries looking normal. LVEDP was normal. She underwent successful PCI/DES to the LAD for  ISR. An additional overlapping stent was placed distally due to suspected edge dissection. She was seen again in 07/2018 noting return of angina x 3 weeks similar to her prior presentation. She underwent repeat LHC on 08/22/2018 that showed severe one-vessel CAD with proximal to mid LAD stenosis of 95% due to ISR in the mid LAD. Otherwise, there was no obstructive disease with normal LVSF and LVEDP. Given this was the 2nd time for ISR in the LAD despite using 2 different drug-eluting stents, with the most recent stent being placed just 3 months prior, CABG was recommended. P2y12 was 177, indicating Plavix was working for her. She underwent successful one-vessel CABG on 08/27/2018 with a LIMA to LAD.  Her postoperative course was noted to involve a significant amount of postoperative pain especially in the neck with subsequent improvement.  She was most recently seen by Dr. Fletcher Anon on 12/28/2018 and noted she had resumed her most of her exercise regimen though was somewhat anxious after she had developed mild chest discomfort twice.  Patient contacted our office on 05/07/2019 noticing a several week history while running her forearms began to ache followed by aching along the posterior left shoulder and left axilla.  There was no associated chest tightness.  Following the development of the symptoms that she began to decrease her exercise regimen though was now having symptoms at rest including pain in her front teeth.  She indicated symptoms were similar to what she had experienced in the past though not exactly the same.  She comes in today, noting for the past week  the development of intermittent bilateral arm pain as well as upper bilateral chest discomfort that is not described as tightness or pain.  This discomfort at times has radiated superiorly to the left side of her neck, left jaw, and into the front teeth.  Symptoms feel exactly the same as her prior presentation leading to PCI and CABG as above.  However,  this time she has presented prior to the development of chest pain.  She also notes pinpoint pain along the palm of the right hand.  She is currently symptom-free.  No associated shortness of breath, dizziness, palpitations, presyncope, or syncope.  She is compliant with all medications.     Labs: 09/09/2018- WBC 5.5, Hgb 11.5, PLT 286, serum creatinine 0.1, potassium 4.8, albumin 4.3, AST/ALT normal, triglycerides 145, LDL 61, ferritin 35, iron 36  Past Medical History:  Diagnosis Date   Anemia    "as a child"   Anxiety    Bursitis    Childhood asthma    Chronic lower back pain    Coronary artery disease    a. 09/2016: cath showing 95% stenosis of the mid-LAD. Xience 2.5x67mm DES placed, remaining left and right coronary tree angiographically normal, lip swelling noted at the end of the case without rash or respiratory distress, treated with IV Solu-Medrol and Benadryl.   History of nuclear stress test    a. 07/2016: no sig ischemia, nl wall motion, EF 72%, small defect along mid to apical anteroseptal wall c/w breast attenuation artifact, low risk study   Hypertension    Migraine    "it was a bout; beginning of 2017; for ~ 1 month; went away" (05/23/2018)   Mitral regurgitation    a. echo 08/2016: vigorous EF 65-70%, nl wall motion, nl LV diastolic fxn, mild MR   Tendonitis     Past Surgical History:  Procedure Laterality Date   CARDIAC CATHETERIZATION N/A 10/05/2016   Procedure: Left Heart Cath and Coronary Angiography;  Surgeon: Wellington Hampshire, MD;  Location: [facility];  Service: Cardiovascular;  Laterality: N/A;   CARDIAC CATHETERIZATION N/A 10/05/2016   Procedure: Coronary Stent Intervention;  Surgeon: Wellington Hampshire, MD;  Location: [facility];  Service: Cardiovascular;  Laterality: N/A;   CESAREAN SECTION  2003; 2005   CORONARY ANGIOPLASTY WITH STENT PLACEMENT  05/23/2018   "2 stents"   CORONARY ARTERY BYPASS GRAFT N/A 08/27/2018    Procedure: CORONARY ARTERY BYPASS GRAFTING (CABG) x 1 using LEFT INTERNAL MAMMARY ARTERY;  Surgeon: Gaye Pollack, MD;  Location: [facility];  Service: Open Heart Surgery;  Laterality: N/A;   CORONARY STENT INTERVENTION N/A 05/23/2018   Procedure: CORONARY STENT INTERVENTION;  Surgeon: Wellington Hampshire, MD;  Location: [facility];  Service: Cardiovascular;  Laterality: N/A;  mid LAD   ECTOPIC PREGNANCY SURGERY  1991   LEFT HEART CATH AND CORONARY ANGIOGRAPHY N/A 05/23/2018   Procedure: LEFT HEART CATH AND CORONARY ANGIOGRAPHY;  Surgeon: Wellington Hampshire, MD;  Location: [facility];  Service: Cardiovascular;  Laterality: N/A;   LEFT HEART CATH AND CORONARY ANGIOGRAPHY N/A 08/22/2018   Procedure: LEFT HEART CATH AND CORONARY ANGIOGRAPHY;  Surgeon: Wellington Hampshire, MD;  Location: [facility];  Service: Cardiovascular;  Laterality: N/A;   TEE WITHOUT CARDIOVERSION N/A 08/27/2018   Procedure: TRANSESOPHAGEAL ECHOCARDIOGRAM (TEE);  Surgeon: Gaye Pollack, MD;  Location: [facility];  Service: Open Heart Surgery;  Laterality: N/A;    Current Meds  Medication Sig  ALPRAZolam (XANAX) 0.25 MG tablet Take 1 tablet (0.25 mg total) by mouth daily as needed for anxiety.   aspirin 325 MG EC tablet Take 1 tablet (325 mg total) by mouth daily.   cholecalciferol (VITAMIN D3) 25 MCG (1000 UT) tablet Take 10,000 Units by mouth daily.   losartan (COZAAR) 50 MG tablet Take 50 mg by mouth daily.    nitrofurantoin (MACRODANTIN) 25 MG capsule Take 25 mg by mouth daily as needed (for UTI prevention).    rosuvastatin (CRESTOR) 5 MG tablet TAKE 1 TABLET(5 MG) BY MOUTH DAILY    Allergies:   Codeine and Contrast media [iodinated diagnostic agents]   Social History:  The patient  reports that she quit smoking about 13 years ago. Her smoking use included cigarettes. She has a 11.25 pack-year smoking history. She has never used smokeless tobacco. She reports current alcohol use. She reports that she  does not use drugs.   Family History:  The patient's family history includes Heart disease in her mother; Parkinson's disease in her father.  ROS:   Review of Systems  Constitutional: Positive for malaise/fatigue. Negative for chills, diaphoresis, fever and weight loss.  HENT: Negative for congestion.   Eyes: Negative for discharge and redness.  Respiratory: Negative for cough, hemoptysis, sputum production, shortness of breath and wheezing.   Cardiovascular: Positive for chest pain. Negative for palpitations, orthopnea, claudication, leg swelling and PND.  Gastrointestinal: Negative for abdominal pain, blood in stool, heartburn, melena, nausea and vomiting.  Genitourinary: Negative for hematuria.  Musculoskeletal: Positive for myalgias. Negative for falls.  Skin: Negative for rash.  Neurological: Positive for weakness. Negative for dizziness, tingling, tremors, sensory change, speech change, focal weakness and loss of consciousness.  Endo/Heme/Allergies: Does not bruise/bleed easily.  Psychiatric/Behavioral: Negative for substance abuse. The patient is not nervous/anxious.   All other systems reviewed and are negative.    PHYSICAL EXAM:  VS:  BP 120/82 (BP Location: Left Arm, Patient Position: Sitting, Cuff Size: Normal)    Pulse 70    Ht 5' 6.53" (1.69 m)    Wt 150 lb 4 oz (68.2 kg)    SpO2 96%    BMI 23.87 kg/m  BMI: Body mass index is 23.87 kg/m.  Physical Exam  Constitutional: She is oriented to person, place, and time. She appears well-developed and well-nourished.  HENT:  Head: Normocephalic and atraumatic.  Eyes: Right eye exhibits no discharge. Left eye exhibits no discharge.  Neck: Normal range of motion. No JVD present.  Cardiovascular: Normal rate, regular rhythm, S1 normal, S2 normal and normal heart sounds. Exam reveals no distant heart sounds, no friction rub, no midsystolic click and no opening snap.  No murmur heard. Pulses:      Dorsalis pedis pulses are 2+ on the  right side and 2+ on the left side.       Posterior tibial pulses are 2+ on the right side and 2+ on the left side.  Pulmonary/Chest: Effort normal and breath sounds normal. No respiratory distress. She has no decreased breath sounds. She has no wheezes. She has no rales. She exhibits no tenderness.  Abdominal: Soft. She exhibits no distension. There is no abdominal tenderness.  Musculoskeletal:        General: No edema.  Neurological: She is alert and oriented to person, place, and time.  Skin: Skin is warm and dry. No cyanosis. Nails show no clubbing.  Psychiatric: She has a normal mood and affect. Her speech is normal and behavior is normal. Judgment  and thought content normal.     EKG:  Was ordered and interpreted by me today. Shows NSR, 67 bpm, old septal infarct, no acute st/t changes   Recent Labs: 08/28/2018: Magnesium 2.2 09/17/2018: ALT 19; BUN 12; Creatinine, Ser 0.81; Hemoglobin 11.5; Platelets 286; Potassium 4.8; Sodium 141  09/17/2018: Chol/HDL Ratio 2.9; Cholesterol, Total 137; HDL 47; LDL Calculated 61; LDL Direct 61; Triglycerides 145   CrCl cannot be calculated (Patient's most recent lab result is older than the maximum 21 days allowed.).   Wt Readings from Last 3 Encounters:  05/09/19 150 lb 4 oz (68.2 kg)  12/28/18 148 lb (67.1 kg)  10/01/18 143 lb 3.2 oz (65 kg)     Other studies reviewed: Additional studies/records reviewed today include: summarized above  ASSESSMENT AND PLAN:  1. CAD involving the native coronary arteries status post 1-vessel CABG with accelerating angina: Currently chest pain-free.  Symptoms feel very similar to her prior presentation leading to multiple PCI with subsequent CABG as outlined above.  Scheduled for cardiac catheterization.  Discussed escalation of antianginal therapy with isosorbide mononitrate though this was deferred at this time. Continue full-dose ASA. Risks and benefits of cardiac catheterization have been discussed with the  patient including risks of bleeding, bruising, infection, kidney damage, stroke, heart attack, and death. The patient understands these risks and is willing to proceed with the procedure. All questions have been answered and concerns listened to.  She will be treated for contrast allergy prior to procedure with prednisone 50 mg every 6 hours x3 doses the day before her procedure followed by Benadryl 50 mg p.o. 1 hour prior to procedure.  2. HLD: LDL of 61 from 08/2018 with a goal LDL < 70. Tolerating Crestor.   3. HTN: Blood pressure is well controlled at her visit today. Continue losartan.   Disposition: F/u with Dr. Fletcher Anon or an APP in 3 weeks.  Current medicines are reviewed at length with the patient today.  The patient did not have any concerns regarding medicines.  Signed, Christell Faith, PA-C 05/09/2019 9:02 AM     [facility] [address] [place] [place], [place] [address] [phone]

## 2019-05-07 NOTE — Telephone Encounter (Signed)
Patient states she is having aches in her arms, and shoulders, and upper back. States she felt this way before, and is concerned. Denies any CP, SOB, states she has had some nausea. States she has been experiencing this for a few weeks, and it gets worse with exercise.

## 2019-05-07 NOTE — Telephone Encounter (Signed)
Patient notified that if symptoms worsen or become more concerning to go to the ED. She verbalized understanding. She said she does not feel all the symptoms all day. She mainly feels the "ache" in her left armpit off and on throughout the day.

## 2019-05-07 NOTE — Telephone Encounter (Signed)
Called and spoke with patient. She reports a few weeks ago she began to notice symptoms when she was working out and running. While running there was ache in her forearms, back around left shoulder and left armpit area.  No chest tightness. She decided about 1 week ago to take it easy and has not been working out. She is still having the symptoms now at rest. The "ache" is in the left side of her back around to her armpit. She also has a pain in her front teeth. She is also experiencing on and off heartburn feeling which is not usual for her. States "I recognize the feeling and its not right." Says its not exactly what she felt in the past but similar. Advised I would make appointment with Thurmond Butts for Thursday (preferred to see him since she has in the past) but will route to provider to make sure this is appropriate.       COVID-19 Pre-Screening Questions:  . In the past 7 to 10 days have you had a cough,  shortness of breath, headache, congestion, fever (100 or greater) body aches, chills, sore throat, or sudden loss of taste or sense of smell?  NO . Have you been around anyone with known Covid 19? NO . Have you been around anyone who is awaiting Covid 19 test results in the past 7 to 10 days?  NO . Have you been around anyone who has been exposed to Covid 19, or has mentioned symptoms of Covid 19 within the past 7 to 10 days?   NO

## 2019-05-08 ENCOUNTER — Telehealth: Payer: Self-pay | Admitting: Physician Assistant

## 2019-05-08 NOTE — Telephone Encounter (Signed)
Call to patient regarding possible exposure to CV19 virus.   Pt has in office appt tomorrow with Christell Faith, PA.   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 She got a report that a client that had been at the gym either Thursday or Friday had tested positive for CV19.   She does not know who the client is because of privacy issues and has no known contact with individual.   Pt denies fever, cough, chills, loss of taste or smell. She denies any known contact with a patient that has tested positive.   Consulted with Ignacia Bayley, NP. He stated that since she has no sx and no known exposure she is safe to come into office tomorrow.   I reviewed clinic procedures for CV19. She verbalized agreement.   Advised pt to call for any further questions or concerns. Appt confirmed for tomorrow.

## 2019-05-08 NOTE — Telephone Encounter (Signed)
COVID-19 Pre-Screening Questions:  In the past 7 to 10 days have you had a cough, shortness of breath, headache, congestion, fever (100 or greater) body aches, chills, sore throat, or sudden loss of taste or sense of smell? Headache (says its from the weather change)  Have you been around anyone with known Covid 19. Have you been around anyone who is awaiting Covid 19 test results in the past 7 to 10 days? Yes ( a friend a tested negative 6/16) (a lady at work tested positive and she was in the gym at the same time)  Have you been around anyone who has been exposed to Covid 19, or has mentioned symptoms of Covid 19 within the past 7 to 10 days? NO  If you have any concerns/questions about symptoms patients report during screening (either on the phone or at [facility]). Contact the provider seeing the patient or DOD for further guidance. If neither are available contact a member of the leadership team.  Due to answers we are going to reschedule this appointment and advise to contact her PCP and let them know this information.

## 2019-05-09 ENCOUNTER — Encounter: Payer: Self-pay | Admitting: Physician Assistant

## 2019-05-09 ENCOUNTER — Ambulatory Visit (INDEPENDENT_AMBULATORY_CARE_PROVIDER_SITE_OTHER): Admitting: Physician Assistant

## 2019-05-09 ENCOUNTER — Other Ambulatory Visit: Payer: Self-pay

## 2019-05-09 VITALS — BP 120/82 | HR 70 | Ht 66.53 in | Wt 150.2 lb

## 2019-05-09 DIAGNOSIS — E785 Hyperlipidemia, unspecified: Secondary | ICD-10-CM | POA: Diagnosis not present

## 2019-05-09 DIAGNOSIS — I10 Essential (primary) hypertension: Secondary | ICD-10-CM

## 2019-05-09 DIAGNOSIS — Z951 Presence of aortocoronary bypass graft: Secondary | ICD-10-CM | POA: Diagnosis not present

## 2019-05-09 DIAGNOSIS — I25118 Atherosclerotic heart disease of native coronary artery with other forms of angina pectoris: Secondary | ICD-10-CM

## 2019-05-09 DIAGNOSIS — I200 Unstable angina: Secondary | ICD-10-CM | POA: Diagnosis not present

## 2019-05-09 MED ORDER — PREDNISONE 50 MG PO TABS: 50.0000 mg | ORAL_TABLET | Freq: Four times a day (QID) | ORAL | 0 refills | Status: DC

## 2019-05-09 NOTE — Patient Instructions (Addendum)
Medication Instructions:  Your physician recommends that you continue on your current medications as directed. Please refer to the Current Medication list given to you today.  If you need a refill on your cardiac medications before your next appointment, please call your pharmacy.   Lab work: 1- Your physician recommends that you return for lab work in 1 week (BMET, CBC) at [facility]. No appt is needed. Hours are M-F 7AM- 6 PM. Thursday June 25th.  2- You will need COVID testing 3 days prior to your procedure. On Thurs June 25th. Please go to drive through testing located front of medical arts building between 10:30 and 12:30.   If you have labs (blood work) drawn today and your tests are completely normal, you will receive your results only by: Marland Kitchen MyChart Message (if you have MyChart) OR . A paper copy in the mail If you have any lab test that is abnormal or we need to change your treatment, we will call you to review the results.  Testing/Procedures: 1- L heart cath     [address] [address], [place] [address] Dept: [phone] Loc: [phone]  MIYANNA WIERSMA  05/09/2019  You are scheduled for a Cardiac Catheterization on Monday, June 29 with Dr. [name].  1. [facility] at [facility] (This time is 1.5 hours before your procedure to ensure your preparation). Free valet parking service is available.   Special note: Every effort is made to have your procedure done on time. Please understand that emergencies sometimes delay scheduled procedures.  2. Diet: Do not eat solid foods after midnight.  The patient may have clear liquids until 5am upon the day of the procedure.  3. Labs: See other 4. Medication instructions in preparation for your procedure:   Contrast Allergy:  Prednisone 1 tablet (50 mg total) x 3 doses (every 6 hours) starting  1 day prior to cath. Benadryl take 1 tablet (50 mg total) 1 hr prior to your procedure.   On the morning of your procedure, take your Aspirin and any morning medicines NOT listed above.  You may use sips of water.  5. Plan for one night stay--bring personal belongings. 6. Bring a current list of your medications and current insurance cards. 7. You MUST have a responsible person to drive you home. 8. Someone MUST be with you the first 24 hours after you arrive home or your discharge will be delayed. 9. Please wear clothes that are easy to get on and off and wear slip-on shoes.  Thank you for allowing [place] to care for you!   -- [facility]   Follow-Up: At [facility], you and your health needs are our priority.  As part of our continuing mission to provide you with exceptional heart care, we have created designated Provider Care Teams.  These Care Teams include your primary Cardiologist (physician) and Advanced Practice Providers (APPs -  Physician Assistants and Nurse Practitioners) who all work together to provide you with the care you need, when you need it. You will need a follow up appointment in 3 weeks. You may see Dr. Fletcher Anon or Christell Faith, PA-C.

## 2019-05-10 ENCOUNTER — Other Ambulatory Visit: Payer: Self-pay

## 2019-05-10 ENCOUNTER — Telehealth: Payer: Self-pay | Admitting: Cardiovascular Disease

## 2019-05-10 ENCOUNTER — Encounter: Payer: Self-pay | Admitting: Emergency Medicine

## 2019-05-10 DIAGNOSIS — I10 Essential (primary) hypertension: Secondary | ICD-10-CM | POA: Insufficient documentation

## 2019-05-10 DIAGNOSIS — Z79899 Other long term (current) drug therapy: Secondary | ICD-10-CM | POA: Insufficient documentation

## 2019-05-10 DIAGNOSIS — Z87891 Personal history of nicotine dependence: Secondary | ICD-10-CM | POA: Insufficient documentation

## 2019-05-10 DIAGNOSIS — Z951 Presence of aortocoronary bypass graft: Secondary | ICD-10-CM | POA: Diagnosis not present

## 2019-05-10 DIAGNOSIS — I2510 Atherosclerotic heart disease of native coronary artery without angina pectoris: Secondary | ICD-10-CM | POA: Diagnosis not present

## 2019-05-10 DIAGNOSIS — R0789 Other chest pain: Secondary | ICD-10-CM | POA: Diagnosis not present

## 2019-05-10 DIAGNOSIS — R079 Chest pain, unspecified: Secondary | ICD-10-CM | POA: Diagnosis present

## 2019-05-10 LAB — CBC
HCT: 46.9 % — ABNORMAL HIGH (ref 36.0–46.0)
Hemoglobin: 14.9 g/dL (ref 12.0–15.0)
MCH: 26.1 pg (ref 26.0–34.0)
MCHC: 31.8 g/dL (ref 30.0–36.0)
MCV: 82.3 fL (ref 80.0–100.0)
Platelets: 187 10*3/uL (ref 150–400)
RBC: 5.7 MIL/uL — ABNORMAL HIGH (ref 3.87–5.11)
RDW: 14.7 % (ref 11.5–15.5)
WBC: 7.1 10*3/uL (ref 4.0–10.5)
nRBC: 0 % (ref 0.0–0.2)

## 2019-05-10 LAB — BASIC METABOLIC PANEL
Anion gap: 11 (ref 5–15)
BUN: 17 mg/dL (ref 6–20)
CO2: 26 mmol/L (ref 22–32)
Calcium: 10.4 mg/dL — ABNORMAL HIGH (ref 8.9–10.3)
Chloride: 104 mmol/L (ref 98–111)
Creatinine, Ser: 0.94 mg/dL (ref 0.44–1.00)
GFR calc Af Amer: >60 mL/min (ref >60)
GFR calc non Af Amer: >60 mL/min (ref >60)
Glucose, Bld: 123 mg/dL — ABNORMAL HIGH (ref 70–99)
Potassium: 3.6 mmol/L (ref 3.5–5.1)
Sodium: 141 mmol/L (ref 135–145)

## 2019-05-10 LAB — TROPONIN I: Troponin I: <0.03 ng/mL (ref <0.03)

## 2019-05-10 MED ORDER — SODIUM CHLORIDE 0.9% FLUSH: 3.0000 mL | Freq: Once | INTRAVENOUS | Status: DC

## 2019-05-10 NOTE — Telephone Encounter (Signed)
Left mom to schedule 3 week follow up appointment with Dr. Fletcher Anon or Christell Faith, PA

## 2019-05-10 NOTE — ED Triage Notes (Signed)
Patient states that around 20:00 she developed back pain and bilateral arm pain with shortness of breath and nausea. Patient states that she checked her blood pressure at the time and it was 200/100. Patient states that she has significant cardiac history and has a cardiac cath scheduled.

## 2019-05-11 ENCOUNTER — Emergency Department

## 2019-05-11 ENCOUNTER — Emergency Department
Admission: EM | Admit: 2019-05-11 | Discharge: 2019-05-11 | Disposition: A | Discharge status: Home / Self Care | Attending: Emergency Medicine | Admitting: Emergency Medicine

## 2019-05-11 DIAGNOSIS — R079 Chest pain, unspecified: Secondary | ICD-10-CM

## 2019-05-11 DIAGNOSIS — R05 Cough: Secondary | ICD-10-CM

## 2019-05-11 DIAGNOSIS — R059 Cough, unspecified: Secondary | ICD-10-CM

## 2019-05-11 LAB — TROPONIN I: Troponin I: <0.03 ng/mL (ref <0.03)

## 2019-05-11 MED ORDER — LOSARTAN POTASSIUM 50 MG PO TABS: 50.0000 mg | ORAL_TABLET | Freq: Once | ORAL | Status: DC

## 2019-05-11 MED ORDER — ROSUVASTATIN CALCIUM 5 MG PO TABS
5.0000 mg | ORAL_TABLET | Freq: Once | ORAL | Status: DC
  Filled 2019-05-11: qty 1

## 2019-05-11 MED ORDER — NITROGLYCERIN 0.4 MG SL SUBL: 0.4000 mg | SUBLINGUAL_TABLET | SUBLINGUAL | 0 refills | Status: DC | PRN

## 2019-05-11 MED ORDER — ASPIRIN EC 325 MG PO TBEC: 325.0000 mg | DELAYED_RELEASE_TABLET | Freq: Once | ORAL | Status: DC

## 2019-05-11 NOTE — ED Provider Notes (Signed)
[facility] Emergency Department Provider Note  Time seen: 8:10 AM  I have reviewed the triage vital signs and the nursing notes.   HISTORY  Chief Complaint Chest Pain   HPI Kim Villarreal is a 48 y.o. female with a past medical history of anemia, anxiety, CAD, hypertension, presents to the emergency department for high blood pressure and chest tightness.  According to the patient she has a history of several stents as well as a cardiac bypass.  Patient states last night she is feeling some mild tightness in her chest, she attempted to do her ECG through her apple watch and it could not detect her ECG.  Patient states this caused her to start to "panic" and she took her blood pressure and it was 200/100, which worsened her panic per patient so she came into the emergency department.  Patient has been in the waiting room nearly 9 hours before my evaluation.  Currently the patient states she feels well states a very slight amount of shortness of breath over the past 24 hours.  Denies any fever or cough.  Denies any chest discomfort at this time.  Blood pressure currently 174/67.  Patient states she is due for her morning blood pressure meds   Past Medical History:  Diagnosis Date  . Anemia    "as a child"  . Anxiety   . Bursitis   . Childhood asthma   . Chronic lower back pain   . Coronary artery disease    a. 09/2016: cath showing 95% stenosis of the mid-LAD. Xience 2.5x6mm DES placed, remaining left and right coronary tree angiographically normal, lip swelling noted at the end of the case without rash or respiratory distress, treated with IV Solu-Medrol and Benadryl.  Marland Kitchen History of nuclear stress test    a. 07/2016: no sig ischemia, nl wall motion, EF 72%, small defect along mid to apical anteroseptal wall c/w breast attenuation artifact, low risk study  . Hypertension   . Migraine    "it was a bout; beginning of 2017; for ~ 1 month; went away" (05/23/2018)  .  Mitral regurgitation    a. echo 08/2016: vigorous EF 65-70%, nl wall motion, nl LV diastolic fxn, mild MR  . Tendonitis     Patient Active Problem List   Diagnosis Date Noted  . Unstable angina ([place]) 05/20/2019  . 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 Coronary stent restenosis 08/24/2018  . Essential hypertension 10/06/2016  . CAD (coronary artery disease), native coronary artery 10/06/2016  . Status post coronary artery stent placement     Past Surgical History:  Procedure Laterality Date  . CARDIAC CATHETERIZATION N/A 10/05/2016   Procedure: Left Heart Cath and Coronary Angiography;  Surgeon: Wellington Hampshire, MD;  Location: [place] [facility];  Service: Cardiovascular;  Laterality: N/A;  . CARDIAC CATHETERIZATION N/A 10/05/2016   Procedure: Coronary Stent Intervention;  Surgeon: Wellington Hampshire, MD;  Location: [place] [facility];  Service: Cardiovascular;  Laterality: N/A;  . CESAREAN SECTION  2003; 2005  . CORONARY ANGIOPLASTY WITH STENT PLACEMENT  05/23/2018   "2 stents"  . CORONARY ARTERY BYPASS GRAFT N/A 08/27/2018   Procedure: CORONARY ARTERY BYPASS GRAFTING (CABG) x 1 using LEFT INTERNAL MAMMARY ARTERY;  Surgeon: Gaye Pollack, MD;  Location: [place] [facility];  Service: Open Heart Surgery;  Laterality: N/A;  . CORONARY STENT INTERVENTION N/A 05/23/2018   Procedure: CORONARY STENT INTERVENTION;  Surgeon: Wellington Hampshire, MD;  Location: [place] [facility];  Service: Cardiovascular;  Laterality: N/A;  mid LAD  . ECTOPIC PREGNANCY SURGERY  1991  . LEFT HEART CATH AND CORONARY ANGIOGRAPHY N/A 05/23/2018   Procedure: LEFT HEART CATH AND CORONARY ANGIOGRAPHY;  Surgeon: Wellington Hampshire, MD;  Location: [place] [facility];  Service: Cardiovascular;  Laterality: N/A;  . LEFT HEART CATH AND CORONARY ANGIOGRAPHY N/A 08/22/2018   Procedure: LEFT HEART CATH AND CORONARY ANGIOGRAPHY;  Surgeon: Wellington Hampshire, MD;  Location: [place] [facility];  Service: Cardiovascular;  Laterality: N/A;  . TEE WITHOUT  CARDIOVERSION N/A 08/27/2018   Procedure: TRANSESOPHAGEAL ECHOCARDIOGRAM (TEE);  Surgeon: Gaye Pollack, MD;  Location: [place];  Service: Open Heart Surgery;  Laterality: N/A;    Prior to Admission medications   Medication Sig Start Date End Date Taking? Authorizing Provider  ALPRAZolam (XANAX) 0.25 MG tablet Take 1 tablet (0.25 mg total) by mouth daily as needed for anxiety. 09/17/18   Rise Mu, PA-C  aspirin 325 MG EC tablet Take 1 tablet (325 mg total) by mouth daily. 12/28/18   Wellington Hampshire, MD  cholecalciferol (VITAMIN D3) 25 MCG (1000 UT) tablet Take 10,000 Units by mouth daily.    [provider]  losartan (COZAAR) 50 MG tablet Take 50 mg by mouth daily.  08/15/18   [provider]  nitrofurantoin (MACRODANTIN) 25 MG capsule Take 25 mg by mouth daily as needed (for UTI prevention).  07/18/16   [provider]  predniSONE (DELTASONE) 50 MG tablet Take 1 tablet (50 mg total) by mouth every 6 (six) hours. Take 3 doses, six hours apart starting 05/19/19. 05/09/19   Rise Mu, PA-C  rosuvastatin (CRESTOR) 5 MG tablet TAKE 1 TABLET(5 MG) BY MOUTH DAILY 12/28/18   Wellington Hampshire, MD    Allergies  Allergen Reactions  . Codeine Nausea And Vomiting  . Contrast Media [Iodinated Diagnostic Agents] Swelling    The patient had mild lip swelling at the end of the case without rash or respiratory distress. This might be due to dye reaction. 2017    Family History  Problem Relation Age of Onset  . Heart disease Mother   . Parkinson's disease Father     Social History Social History   Tobacco Use  . Smoking status: Former Smoker    Packs/day: 0.75    Years: 15.00    Pack years: 11.25    Types: Cigarettes    Quit date: 2007    Years since quitting: 13.4  . Smokeless tobacco: Never Used  Substance Use Topics  . Alcohol use: Yes    Comment: occ  . Drug use: Never    Review of Systems Constitutional: Negative for fever. Cardiovascular: Mild chest  tightness last night. Respiratory: Shortness of breath over the past 24 hours, minimal per patient.  Negative cough Gastrointestinal: Negative for abdominal pain, vomiting Musculoskeletal: Negative for musculoskeletal complaints Neurological: Negative for headache All other ROS negative  ____________________________________________   PHYSICAL EXAM:  VITAL SIGNS: ED Triage Vitals  Enc Vitals Group     BP 05/10/19 2311 (!) 176/88     Pulse Rate 05/10/19 2311 66     Resp 05/10/19 2311 18     Temp 05/10/19 2311 98.7 F (37.1 C)     Temp Source 05/10/19 2311 Oral     SpO2 05/10/19 2311 98 %     Weight 05/10/19 2316 150 lb (68 kg)     Height 05/10/19 2316 5\' 6"  (1.676 m)  Head Circumference --      Peak Flow --      Pain Score 05/10/19 2316 0     Pain Loc --      Pain Edu? --      Excl. in [place]? --     Constitutional: Alert and oriented. Well appearing and in no distress. Eyes: Normal exam ENT      Head: Normocephalic and atraumatic.      Mouth/Throat: Mucous membranes are moist. Cardiovascular: Normal rate, regular rhythm. Respiratory: Normal respiratory effort without tachypnea nor retractions. Breath sounds are clear  Gastrointestinal: Soft and nontender. No distention.   Musculoskeletal: Nontender with normal range of motion in all extremities.  Neurologic:  Normal speech and language. No gross focal neurologic deficits Skin:  Skin is warm, dry and intact.  Psychiatric: Mood and affect are normal.   ____________________________________________    EKG  EKG viewed and interpreted by myself shows a sinus rhythm at 62 bpm with a narrow QRS, normal axis, normal intervals occasional PVC.  Nonspecific ST changes.  ____________________________________________    RADIOLOGY  Chest x-ray is negative  ____________________________________________   INITIAL IMPRESSION / ASSESSMENT AND PLAN / ED COURSE  Pertinent labs & imaging results that were available during my  care of the patient were reviewed by me and considered in my medical decision making (see chart for details).   Patient presents to the emergency department for chest tightness last night which has since resolved.  Differential would include ACS, anxiety, angina, pneumonia or atypical infection.  Patient's lab work is largely within normal limits including a negative troponin.  As the patient has been here now 9 hours we will repeat a troponin.  If the patient's repeat troponin remains negative anticipate likely discharge home with cardiology follow-up.  Patient states she sees Dr. Fletcher Anon for cardiology and actually has a cardiac catheterization scheduled for 05/20/2019.  As the patient is symptom-free at this time I believe she would be safe for discharge home with outpatient follow-up.  We will also obtain a chest x-ray and an outpatient [place] swab given the patient's mild shortness of breath over the past 24 hours.  Patient agreeable to plan of care.  We will dose the patient's morning blood pressure medications.  Chest x-ray is negative.  Repeat troponin is negative.  COVID test has been sent.  I sent a message for Dr. Fletcher Anon regarding the patient, she has follow-up next week.  I discussed return precautions.  Patient agreeable to plan of care.  AZARYA OCONNELL was evaluated in Emergency Department on 05/11/2019 for the symptoms described in the history of present illness. She was evaluated in the context of the global COVID-19 pandemic, which necessitated consideration that the patient might be at risk for infection with the SARS-CoV-2 virus that causes COVID-19. Institutional protocols and algorithms that pertain to the evaluation of patients at risk for COVID-19 are in a state of rapid change based on information released by regulatory bodies including the CDC and federal and state organizations. These policies and algorithms were followed during the patient's care in the  ED.  ____________________________________________   FINAL CLINICAL IMPRESSION(S) / ED DIAGNOSES  Chest tightness Hypertension   Harvest Dark, MD 05/11/19 [phone]

## 2019-05-11 NOTE — ED Notes (Signed)
Patient to stat desk in no acute distress asking about wait time. Patient given update on wait time. Patient verbalizes understanding.  

## 2019-05-13 NOTE — Telephone Encounter (Signed)
Scheduled

## 2019-05-16 ENCOUNTER — Other Ambulatory Visit: Payer: Self-pay

## 2019-05-16 ENCOUNTER — Other Ambulatory Visit
Admission: RE | Admit: 2019-05-16 | Discharge: 2019-05-16 | Disposition: A | Discharge status: Home / Self Care | Source: Home / Self Care | Attending: Physician Assistant | Admitting: Physician Assistant

## 2019-05-16 ENCOUNTER — Encounter
Admission: RE | Admit: 2019-05-16 | Discharge: 2019-05-16 | Disposition: A | Discharge status: Home / Self Care | Source: Ambulatory Visit | Attending: Cardiovascular Disease | Admitting: Cardiovascular Disease

## 2019-05-16 DIAGNOSIS — Z1159 Encounter for screening for other viral diseases: Secondary | ICD-10-CM | POA: Insufficient documentation

## 2019-05-16 DIAGNOSIS — Z01812 Encounter for preprocedural laboratory examination: Secondary | ICD-10-CM | POA: Insufficient documentation

## 2019-05-16 DIAGNOSIS — I25118 Atherosclerotic heart disease of native coronary artery with other forms of angina pectoris: Secondary | ICD-10-CM

## 2019-05-16 LAB — BASIC METABOLIC PANEL
Anion gap: 9 (ref 5–15)
BUN: 17 mg/dL (ref 6–20)
CO2: 22 mmol/L (ref 22–32)
Calcium: 9.7 mg/dL (ref 8.9–10.3)
Chloride: 109 mmol/L (ref 98–111)
Creatinine, Ser: 0.83 mg/dL (ref 0.44–1.00)
GFR calc Af Amer: >60 mL/min (ref >60)
GFR calc non Af Amer: >60 mL/min (ref >60)
Glucose, Bld: 98 mg/dL (ref 70–99)
Potassium: 4 mmol/L (ref 3.5–5.1)
Sodium: 140 mmol/L (ref 135–145)

## 2019-05-16 LAB — CBC
HCT: 44 % (ref 36.0–46.0)
Hemoglobin: 14.4 g/dL (ref 12.0–15.0)
MCH: 26.6 pg (ref 26.0–34.0)
MCHC: 32.7 g/dL (ref 30.0–36.0)
MCV: 81.3 fL (ref 80.0–100.0)
Platelets: 143 10*3/uL — ABNORMAL LOW (ref 150–400)
RBC: 5.41 MIL/uL — ABNORMAL HIGH (ref 3.87–5.11)
RDW: 14.6 % (ref 11.5–15.5)
WBC: 5 10*3/uL (ref 4.0–10.5)
nRBC: 0 % (ref 0.0–0.2)

## 2019-05-17 LAB — NOVEL CORONAVIRUS, NAA (HOSP ORDER, SEND-OUT TO REF LAB; TAT 18-24 HRS): SARS-CoV-2, NAA: NOT DETECTED

## 2019-05-20 ENCOUNTER — Ambulatory Visit
Admission: RE | Admit: 2019-05-20 | Discharge: 2019-05-20 | Disposition: A | Discharge status: Home / Self Care | Attending: Cardiovascular Disease | Admitting: Cardiovascular Disease

## 2019-05-20 ENCOUNTER — Other Ambulatory Visit: Payer: Self-pay

## 2019-05-20 ENCOUNTER — Encounter: Payer: Self-pay | Admitting: *Deleted

## 2019-05-20 ENCOUNTER — Encounter
Admission: RE | Disposition: A | Discharge status: Home / Self Care | Payer: Self-pay | Source: Home / Self Care | Attending: Cardiovascular Disease

## 2019-05-20 DIAGNOSIS — I10 Essential (primary) hypertension: Secondary | ICD-10-CM | POA: Insufficient documentation

## 2019-05-20 DIAGNOSIS — R079 Chest pain, unspecified: Secondary | ICD-10-CM | POA: Diagnosis present

## 2019-05-20 DIAGNOSIS — I340 Nonrheumatic mitral (valve) insufficiency: Secondary | ICD-10-CM | POA: Diagnosis not present

## 2019-05-20 DIAGNOSIS — E785 Hyperlipidemia, unspecified: Secondary | ICD-10-CM | POA: Insufficient documentation

## 2019-05-20 DIAGNOSIS — Z7982 Long term (current) use of aspirin: Secondary | ICD-10-CM | POA: Insufficient documentation

## 2019-05-20 DIAGNOSIS — Z7902 Long term (current) use of antithrombotics/antiplatelets: Secondary | ICD-10-CM | POA: Diagnosis not present

## 2019-05-20 DIAGNOSIS — I200 Unstable angina: Secondary | ICD-10-CM

## 2019-05-20 DIAGNOSIS — Z951 Presence of aortocoronary bypass graft: Secondary | ICD-10-CM | POA: Insufficient documentation

## 2019-05-20 DIAGNOSIS — Z955 Presence of coronary angioplasty implant and graft: Secondary | ICD-10-CM | POA: Diagnosis not present

## 2019-05-20 DIAGNOSIS — Z87891 Personal history of nicotine dependence: Secondary | ICD-10-CM | POA: Diagnosis not present

## 2019-05-20 DIAGNOSIS — Z885 Allergy status to narcotic agent status: Secondary | ICD-10-CM | POA: Diagnosis not present

## 2019-05-20 DIAGNOSIS — Z8249 Family history of ischemic heart disease and other diseases of the circulatory system: Secondary | ICD-10-CM | POA: Insufficient documentation

## 2019-05-20 DIAGNOSIS — I25118 Atherosclerotic heart disease of native coronary artery with other forms of angina pectoris: Secondary | ICD-10-CM

## 2019-05-20 DIAGNOSIS — F419 Anxiety disorder, unspecified: Secondary | ICD-10-CM | POA: Diagnosis not present

## 2019-05-20 DIAGNOSIS — I25110 Atherosclerotic heart disease of native coronary artery with unstable angina pectoris: Secondary | ICD-10-CM | POA: Insufficient documentation

## 2019-05-20 DIAGNOSIS — Z79899 Other long term (current) drug therapy: Secondary | ICD-10-CM | POA: Insufficient documentation

## 2019-05-20 HISTORY — PX: LEFT HEART CATH AND CORS/GRAFTS ANGIOGRAPHY: CATH118250

## 2019-05-20 LAB — PREGNANCY, URINE: Preg Test, Ur: NEGATIVE

## 2019-05-20 SURGERY — LEFT HEART CATH AND CORS/GRAFTS ANGIOGRAPHY
Anesthesia: Moderate Sedation | Laterality: Left

## 2019-05-20 MED ORDER — HEPARIN (PORCINE) IN NACL 1000-0.9 UT/500ML-% IV SOLN
INTRAVENOUS | Status: AC
  Filled 2019-05-20: qty 1000

## 2019-05-20 MED ORDER — SODIUM CHLORIDE 0.9% FLUSH: 3.0000 mL | INTRAVENOUS | Status: DC | PRN

## 2019-05-20 MED ORDER — SODIUM CHLORIDE 0.9 % WEIGHT BASED INFUSION: 1.0000 mL/kg/h | INTRAVENOUS | Status: DC

## 2019-05-20 MED ORDER — SODIUM CHLORIDE 0.9 % IV SOLN: 250.0000 mL | INTRAVENOUS | Status: DC | PRN

## 2019-05-20 MED ORDER — ACETAMINOPHEN 325 MG PO TABS: 650.0000 mg | ORAL_TABLET | ORAL | Status: DC | PRN

## 2019-05-20 MED ORDER — METOPROLOL TARTRATE 25 MG PO TABS: 25.0000 mg | ORAL_TABLET | Freq: Two times a day (BID) | ORAL | 6 refills | Status: DC

## 2019-05-20 MED ORDER — DIPHENHYDRAMINE HCL 25 MG PO CAPS
50.0000 mg | ORAL_CAPSULE | Freq: Once | ORAL | Status: AC
  Administered 2019-05-20: 50 mg via ORAL

## 2019-05-20 MED ORDER — ONDANSETRON HCL 4 MG/2ML IJ SOLN: 4.0000 mg | Freq: Four times a day (QID) | INTRAMUSCULAR | Status: DC | PRN

## 2019-05-20 MED ORDER — SODIUM CHLORIDE 0.9% FLUSH: 3.0000 mL | Freq: Two times a day (BID) | INTRAVENOUS | Status: DC

## 2019-05-20 MED ORDER — FENTANYL CITRATE (PF) 100 MCG/2ML IJ SOLN
INTRAMUSCULAR | Status: AC
  Filled 2019-05-20: qty 2

## 2019-05-20 MED ORDER — SODIUM CHLORIDE 0.9 % IV SOLN: INTRAVENOUS | Status: DC

## 2019-05-20 MED ORDER — IOHEXOL 300 MG/ML  SOLN
INTRAMUSCULAR | Status: DC | PRN
  Administered 2019-05-20: 90 mL via INTRA_ARTERIAL

## 2019-05-20 MED ORDER — DIPHENHYDRAMINE HCL 50 MG/ML IJ SOLN: 50.0000 mg | Freq: Once | INTRAMUSCULAR | Status: AC

## 2019-05-20 MED ORDER — HEPARIN (PORCINE) IN NACL 1000-0.9 UT/500ML-% IV SOLN
INTRAVENOUS | Status: DC | PRN
  Administered 2019-05-20: 500 mL

## 2019-05-20 MED ORDER — DIPHENHYDRAMINE HCL 25 MG PO CAPS
ORAL_CAPSULE | ORAL | Status: AC
  Filled 2019-05-20: qty 2

## 2019-05-20 MED ORDER — LABETALOL HCL 5 MG/ML IV SOLN
INTRAVENOUS | Status: AC
  Filled 2019-05-20: qty 4

## 2019-05-20 MED ORDER — LABETALOL HCL 5 MG/ML IV SOLN: 10.0000 mg | INTRAVENOUS | Status: DC | PRN

## 2019-05-20 MED ORDER — SODIUM CHLORIDE 0.9 % WEIGHT BASED INFUSION
3.0000 mL/kg/h | INTRAVENOUS | Status: AC
  Administered 2019-05-20: 3 mL/kg/h via INTRAVENOUS

## 2019-05-20 MED ORDER — MIDAZOLAM HCL 2 MG/2ML IJ SOLN
INTRAMUSCULAR | Status: AC
  Filled 2019-05-20: qty 2

## 2019-05-20 MED ORDER — FENTANYL CITRATE (PF) 100 MCG/2ML IJ SOLN
INTRAMUSCULAR | Status: DC | PRN
  Administered 2019-05-20: 50 ug via INTRAVENOUS

## 2019-05-20 MED ORDER — MIDAZOLAM HCL 2 MG/2ML IJ SOLN
INTRAMUSCULAR | Status: DC | PRN
  Administered 2019-05-20: 1 mg via INTRAVENOUS

## 2019-05-20 MED ORDER — LABETALOL HCL 5 MG/ML IV SOLN
INTRAVENOUS | Status: DC | PRN
  Administered 2019-05-20 (×2): 10 mg via INTRAVENOUS

## 2019-05-20 MED ORDER — PREDNISONE 50 MG PO TABS
50.0000 mg | ORAL_TABLET | Freq: Four times a day (QID) | ORAL | Status: DC
  Filled 2019-05-20 (×3): qty 1

## 2019-05-20 SURGICAL SUPPLY — 12 items
CANNULA 5F STIFF (CANNULA) ×3 IMPLANT
CATH INFINITI 5 FR IM (CATHETERS) ×3 IMPLANT
CATH INFINITI 5FR ANG PIGTAIL (CATHETERS) ×3 IMPLANT
CATH INFINITI 5FR JL4 (CATHETERS) ×3 IMPLANT
CATH INFINITI JR4 5F (CATHETERS) ×3 IMPLANT
DEVICE CLOSURE MYNXGRIP 5F (Vascular Products) ×3 IMPLANT
KIT MANI 3VAL PERCEP (MISCELLANEOUS) ×3 IMPLANT
NEEDLE PERC 18GX7CM (NEEDLE) ×3 IMPLANT
PACK CARDIAC CATH (CUSTOM PROCEDURE TRAY) ×3 IMPLANT
SHEATH AVANTI 5FR X 11CM (SHEATH) ×3 IMPLANT
WIRE EMERALD 3MM-J .035X260CM (WIRE) ×3 IMPLANT
WIRE GUIDERIGHT .035X150 (WIRE) ×3 IMPLANT

## 2019-05-20 NOTE — OR Nursing (Signed)
Pt took aspirin at home and 3 doses of prednisone (last dose today at 0949 from home medication supply) benedryl po given also

## 2019-05-20 NOTE — Interval H&P Note (Signed)
Cath Lab Visit (complete for each Cath Lab visit)  Clinical Evaluation Leading to the Procedure:   ACS: No.  Non-ACS:    Anginal Classification: CCS III  Anti-ischemic medical therapy: Minimal Therapy (1 class of medications)  Non-Invasive Test Results: No non-invasive testing performed  Prior CABG: Previous CABG      History and Physical Interval Note:  05/20/2019 11:03 AM  Kim Villarreal  has presented today for surgery, with the diagnosis of LT Heart Cath   Coronary artery disease status post bypass surgery with accelerating angina.  The various methods of treatment have been discussed with the patient and family. After consideration of risks, benefits and other options for treatment, the patient has consented to  Procedure(s): LEFT HEART CATH AND CORS/GRAFTS ANGIOGRAPHY (Left) as a surgical intervention.  The patient's history has been reviewed, patient examined, no change in status, stable for surgery.  I have reviewed the patient's chart and labs.  Questions were answered to the patient's satisfaction.     [name]

## 2019-05-22 ENCOUNTER — Telehealth: Payer: Self-pay | Admitting: Cardiovascular Disease

## 2019-05-22 NOTE — Telephone Encounter (Signed)
Spoke with patient. Had cath on 05/20/19. Started metoprolol tartrate 25 mg two times a day. Took first dose last night. Got up this morning and felt fine. Took second dose. Put her Apple watch on later this morning and noticed her HR was in the 40's. Over the day it has been 45-50. She has the EKG feature and it reads ""irregular rhythm." She feels like her heart is skipping beats and jumping around. She has been lightheaded off and on. Feel like she's had "too many drinks." BP is 187/78. Denies chest pain or shortness of breath. Advised I will route to Dr End, DOD, for further advice.

## 2019-05-22 NOTE — Telephone Encounter (Signed)
STAT if HR is under 50 or over 120 (normal HR is 60-100 beats per minute)  1) What is your heart rate? 40's   BP 187/78   2) Do you have a log of your heart rate readings (document readings)?   Resting rate 56-60 since starting metoprolol has been 45-50 today   3) Do you have any other symptoms? Lightheaded irreg beat feels like skipping

## 2019-05-22 NOTE — Telephone Encounter (Signed)
Let's have the patient stop metoprolol and come in for EKG (today if possible, otherwise tomorrow).  If symptoms worsen, she should go to the ED.  Nelva Bush, MD [facility] Pager: [phone]

## 2019-05-22 NOTE — Telephone Encounter (Signed)
Called patient and she verbalized understanding to stop metoprolol. She is agreeable to come into the office tomorrow morning to see Thurmond Butts.       COVID-19 Pre-Screening Questions:  . In the past 7 to 10 days have you had a cough,  shortness of breath, headache, congestion, fever (100 or greater) body aches, chills, sore throat, or sudden loss of taste or sense of smell? NO . Have you been around anyone with known Covid 19. NO . Have you been around anyone who is awaiting Covid 19 test results in the past 7 to 10 days? NO . Have you been around anyone who has been exposed to Covid 19, or has mentioned symptoms of Covid 19 within the past 7 to 10 days? NO  If you have any concerns/questions about symptoms patients report during screening (either on the phone or at threshold). Contact the provider seeing the patient or DOD for further guidance.  If neither are available contact a member of the leadership team.

## 2019-05-23 ENCOUNTER — Ambulatory Visit (INDEPENDENT_AMBULATORY_CARE_PROVIDER_SITE_OTHER)

## 2019-05-23 ENCOUNTER — Encounter: Payer: Self-pay | Admitting: Physician Assistant

## 2019-05-23 ENCOUNTER — Other Ambulatory Visit: Payer: Self-pay

## 2019-05-23 ENCOUNTER — Ambulatory Visit (INDEPENDENT_AMBULATORY_CARE_PROVIDER_SITE_OTHER): Admitting: Physician Assistant

## 2019-05-23 VITALS — BP 150/72 | HR 57 | Temp 97.3°F | Ht 66.5 in | Wt 149.2 lb

## 2019-05-23 DIAGNOSIS — I493 Ventricular premature depolarization: Secondary | ICD-10-CM | POA: Diagnosis not present

## 2019-05-23 DIAGNOSIS — R002 Palpitations: Secondary | ICD-10-CM | POA: Diagnosis not present

## 2019-05-23 DIAGNOSIS — E785 Hyperlipidemia, unspecified: Secondary | ICD-10-CM

## 2019-05-23 DIAGNOSIS — F419 Anxiety disorder, unspecified: Secondary | ICD-10-CM

## 2019-05-23 DIAGNOSIS — Z951 Presence of aortocoronary bypass graft: Secondary | ICD-10-CM | POA: Diagnosis not present

## 2019-05-23 DIAGNOSIS — I10 Essential (primary) hypertension: Secondary | ICD-10-CM

## 2019-05-23 DIAGNOSIS — I25118 Atherosclerotic heart disease of native coronary artery with other forms of angina pectoris: Secondary | ICD-10-CM | POA: Diagnosis not present

## 2019-05-23 NOTE — Progress Notes (Signed)
Cardiology Office Note Date:  05/23/2019  Patient ID:  Kim, Villarreal 06-13-1971, MRN [id number] PCP:  Derinda Late, MD  Cardiologist:  Dr. Fletcher Anon, MD    Chief Complaint: Bradycardia   History of Present Illness: Kim Villarreal is a 48 y.o. female with history of CAD s/p PCI/DES to the mid LAD in 2017 s/p PCI/DESto the LAD due to ISR as below in 7/2019s/p 1-vessel CABGon10/7/2019with a LIMA to LAD, carotid artery disease with 54 to 59% left internal carotid artery stenosis by carotid ultrasound in 2019, prior tobacco abuse quitting ~ 10 years prior, family history of CAD, HTN, mitral regurgitation, and migraine disorder who presents forevaluation of bradycardia.  Patient underwent Myoview in 07/2016 for chest pain that noted a small region of fixed perfusion defect in the mid to apical anteroseptal region consistent with breast attenuation artifact, overall low risk. She continued to note chest pain and underwent LHC in 09/2016 that showed 95% stenosis in the mid LAD which was successfully treated with PCI/DES. She was noted to have severe myalgias with Lipitor, though seemedto be tolerating Crestor. She was seen in 08/2017, noting intermittent exertional chest tightness and was prescribed Toprol; however she did not take this given that her symptoms had resolved without intervention. She was seen on 05/22/2018 noting a 3 week history of intermittent episodes of chest tightness radiating to both arms, though mostly the right arm. Initially, this was noted after an intense boot camp, though began to occur with less intense activities as well. These symptoms were similar to her pain prior to her above PCI in 2017. She underwent diagnostic LHC on 05/23/2018 that showed severe one-vessel CAD due to ISR in the proximal to mid LAD. No other obstructive disease was noted with the rest of the coronary arteries looking normal. LVEDP was normal. She underwent successful PCI/DES to the LAD for ISR.  An additional overlapping stent was placed distally due to suspected edge dissection. She was seen again in 07/2018 noting return of angina x 3 weeks similar to her prior presentation. She underwent repeat LHC on 08/22/2018 that showed severe one-vessel CAD with proximal to mid LAD stenosisof 95% due toISR in the mid LAD. Otherwise, there was no obstructive disease with normal LVSF and LVEDP. Given this was the 2nd time for ISR in the LAD despite using 2 different drug-eluting stents, with the most recent stent being placed just 3 months prior, CABG was recommended. P2y12 was 177, indicating Plavix was working for her. She underwent successful one-vessel CABG on 08/27/2018 with a LIMA to LAD.  Her postoperative course was noted to involve a significant amount of postoperative pain especially in the neck with subsequent improvement.  She was seen by Dr. Fletcher Anon on 12/28/2018 and noted she had resumed her most of her exercise regimen though was somewhat anxious after she had developed mild chest discomfort twice.  She was most recently seen in the office on 05/09/2019 noting a several week history of forearm ache as well as left posterior shoulder and left axillary discomfort while running.  She felt like symptoms were similar to her prior angina.  In this setting, she was scheduled for diagnostic cardiac cath.  However, prior to undergoing cath she presented to the ED on 6/20 with chest tightness.  Notes indicate she attempted to perform an EKG with her Apple Watch though the device could not detect her EKG.  With this, the patient started to panic and noticed her blood pressure was 200/100 which  worsened her anxiety prompting her to come to the ER.  Note indicates that she waited for nearly 9 hours prior to evaluation.  Blood pressure remained elevated in the 956L systolic.  Troponin negative x2, EKG showed sinus rhythm with prior septal infarct and occasional PVC.  Chest x-ray was nonacute.  She subsequently underwent  diagnostic left heart cath on 05/20/2019 which showed significant underlying one-vessel CAD involving the LAD with patent LIMA to LAD with moderate anastomosis lesion estimated at 40% stenosis.  Otherwise, there was no other obstructive disease.  The LAD stent was now completely occluded.  Normal LV systolic function with mildly elevated LVEDP in the setting of significantly elevated blood pressure.  Continued aggressive medical therapy was recommended.  She was started on metoprolol for blood pressure and anginal control.  With the initiation of Lopressor 25 mg twice daily upon taking the second dose she noted her Apple Watch reported her heart rate in the 40s to 50s bpm with EKG feature reading "irregular rhythm".  She noted associated palpitations and felt lightheaded.  Blood pressure was documented at 187/78.  Initial Apple Watch rhythm strip was submitted for review which shows significant artifact and is uninterpretable.  Subsequent apple watch rhythm strip showed sinus rhythm with occasional PVC and underlying artifact.  Apple Watch reported heart rate of 47 bpm is inaccurate.  She was advised to hold metoprolol and appointment was scheduled for today.  She comes in today to discuss elevated blood pressure and bradycardia.  She states since starting metoprolol tartrate 25 mg twice daily she has noted her heart rate running from the mid 40s to upper 50s bpm with associated fatigue and lightheadedness.  She has also noted an increase in palpitations which have been a longstanding issue for her.  She notes these palpitations are worse under stressful or anxious situations.  With her recent cardiac history and in the setting of the COVID-19 pandemic she reports her anxiety has been significantly worse which has in turn led to a worsening in her palpitations.  Given her underlying bradycardia it was recommended metoprolol be held on 7/1 with her last dose being taken on the morning of 7/1.  Her heart rate has  subsequently improved back to her baseline of approximately upper 50s to low 60s bpm.  Her chest pain remains stable and unchanged from prior.  She is anxious to return back to her regular workouts though would prefer avoiding the gym in the setting of COVID-19.  She has noted some soreness in the right femoral cardiac cath site, especially after her dog jumped on it on 7/1.  She denies any bleeding from the cath site.  She is compliant with all medications though has not yet taken medicines this morning.   Past Medical History:  Diagnosis Date   Anemia    "as a child"   Anxiety    Bursitis    Childhood asthma    Chronic lower back pain    Coronary artery disease    a. 09/2016: cath showing 95% stenosis of the mid-LAD. Xience 2.5x55mm DES placed, remaining left and right coronary tree angiographically normal, lip swelling noted at the end of the case without rash or respiratory distress, treated with IV Solu-Medrol and Benadryl.   History of nuclear stress test    a. 07/2016: no sig ischemia, nl wall motion, EF 72%, small defect along mid to apical anteroseptal wall c/w breast attenuation artifact, low risk study   Hypertension    Migraine    "  it was a bout; beginning of 2017; for ~ 1 month; went away" (05/23/2018)   Mitral regurgitation    a. echo 08/2016: vigorous EF 65-70%, nl wall motion, nl LV diastolic fxn, mild MR   Tendonitis     Past Surgical History:  Procedure Laterality Date   CARDIAC CATHETERIZATION N/A 10/05/2016   Procedure: Left Heart Cath and Coronary Angiography;  Surgeon: Wellington Hampshire, MD;  Location: [place] [facility];  Service: Cardiovascular;  Laterality: N/A;   CARDIAC CATHETERIZATION N/A 10/05/2016   Procedure: Coronary Stent Intervention;  Surgeon: Wellington Hampshire, MD;  Location: [place] [facility];  Service: Cardiovascular;  Laterality: N/A;   CESAREAN SECTION  2003; 2005   CORONARY ANGIOPLASTY WITH STENT PLACEMENT  05/23/2018   "2 stents"    CORONARY ARTERY BYPASS GRAFT N/A 08/27/2018   Procedure: CORONARY ARTERY BYPASS GRAFTING (CABG) x 1 using LEFT INTERNAL MAMMARY ARTERY;  Surgeon: Gaye Pollack, MD;  Location: [place];  Service: Open Heart Surgery;  Laterality: N/A;   CORONARY STENT INTERVENTION N/A 05/23/2018   Procedure: CORONARY STENT INTERVENTION;  Surgeon: Wellington Hampshire, MD;  Location: [place] [facility];  Service: Cardiovascular;  Laterality: N/A;  mid LAD   ECTOPIC PREGNANCY SURGERY  1991   LEFT HEART CATH AND CORONARY ANGIOGRAPHY N/A 05/23/2018   Procedure: LEFT HEART CATH AND CORONARY ANGIOGRAPHY;  Surgeon: Wellington Hampshire, MD;  Location: [place] [facility];  Service: Cardiovascular;  Laterality: N/A;   LEFT HEART CATH AND CORONARY ANGIOGRAPHY N/A 08/22/2018   Procedure: LEFT HEART CATH AND CORONARY ANGIOGRAPHY;  Surgeon: Wellington Hampshire, MD;  Location: [place] [facility];  Service: Cardiovascular;  Laterality: N/A;   LEFT HEART CATH AND CORS/GRAFTS ANGIOGRAPHY Left 05/20/2019   Procedure: LEFT HEART CATH AND CORS/GRAFTS ANGIOGRAPHY;  Surgeon: Wellington Hampshire, MD;  Location: [place] [facility];  Service: Cardiovascular;  Laterality: Left;   TEE WITHOUT CARDIOVERSION N/A 08/27/2018   Procedure: TRANSESOPHAGEAL ECHOCARDIOGRAM (TEE);  Surgeon: Gaye Pollack, MD;  Location: [place];  Service: Open Heart Surgery;  Laterality: N/A;    Current Meds  Medication Sig   ALPRAZolam (XANAX) 0.25 MG tablet Take 1 tablet (0.25 mg total) by mouth daily as needed for anxiety.   aspirin 325 MG EC tablet Take 1 tablet (325 mg total) by mouth daily.   ibuprofen (ADVIL) 200 MG tablet Take 200 mg by mouth every 6 (six) hours as needed for headache or moderate pain.   losartan (COZAAR) 50 MG tablet Take 75 mg by mouth daily.   nitrofurantoin (MACRODANTIN) 25 MG capsule Take 25 mg by mouth daily as needed (for UTI prevention).    nitroGLYCERIN (NITROSTAT) 0.4 MG SL tablet Place 1 tablet (0.4 mg total) under the tongue every 5  (five) minutes as needed for chest pain (Do not tak more than 3 tablets in any 24 hour period).   rosuvastatin (CRESTOR) 5 MG tablet TAKE 1 TABLET(5 MG) BY MOUTH DAILY (Patient taking differently: Take 5 mg by mouth daily. )    Allergies:   Codeine and Contrast media [iodinated diagnostic agents]   Social History:  The patient  reports that she quit smoking about 13 years ago. Her smoking use included cigarettes. She has a 11.25 pack-year smoking history. She has never used smokeless tobacco. She reports current alcohol use. She reports that she does not use drugs.   Family History:  The patient's family history includes Heart disease in her mother; Parkinson's disease in her  father.  ROS:   Review of Systems  Constitutional: Positive for malaise/fatigue. Negative for chills, diaphoresis, fever and weight loss.  HENT: Negative for congestion.   Eyes: Negative for discharge and redness.  Respiratory: Negative for cough, hemoptysis, sputum production, shortness of breath and wheezing.   Cardiovascular: Positive for chest pain and palpitations. Negative for orthopnea, claudication, leg swelling and PND.  Gastrointestinal: Negative for abdominal pain, blood in stool, heartburn, melena, nausea and vomiting.  Genitourinary: Negative for hematuria.  Musculoskeletal: Negative for falls and myalgias.  Skin: Negative for rash.  Neurological: Negative for dizziness, tingling, tremors, sensory change, speech change, focal weakness, loss of consciousness and weakness.  Endo/Heme/Allergies: Does not bruise/bleed easily.  Psychiatric/Behavioral: Negative for substance abuse. The patient is nervous/anxious.   All other systems reviewed and are negative.    PHYSICAL EXAM:  VS:  BP (!) 150/72 (BP Location: Left Arm, Patient Position: Sitting, Cuff Size: Normal)    Pulse (!) 57    Temp (!) 97.3 F (36.3 C)    Ht 5' 6.5" (1.689 m)    Wt 149 lb 4 oz (67.7 kg)    SpO2 99%    BMI 23.73 kg/m  BMI: Body mass  index is 23.73 kg/m.  Physical Exam  Constitutional: She is oriented to person, place, and time. She appears well-developed and well-nourished.  HENT:  Head: Normocephalic and atraumatic.  Eyes: Right eye exhibits no discharge. Left eye exhibits no discharge.  Neck: Normal range of motion. No JVD present.  Cardiovascular: Normal rate, regular rhythm, S1 normal, S2 normal and normal heart sounds. Exam reveals no distant heart sounds, no friction rub, no midsystolic click and no opening snap.  No murmur heard. Pulses:      Posterior tibial pulses are 2+ on the right side and 2+ on the left side.  Right femoral cardiac cath site is well-healing without active bleeding, bruising, swelling, warmth, erythema, or tenderness to palpation.  No bruit.  Pulmonary/Chest: Effort normal and breath sounds normal. No respiratory distress. She has no decreased breath sounds. She has no wheezes. She has no rales. She exhibits no tenderness.  Abdominal: Soft. She exhibits no distension. There is no abdominal tenderness.  Musculoskeletal:        General: No edema.  Neurological: She is alert and oriented to person, place, and time.  Skin: Skin is warm and dry. No cyanosis. Nails show no clubbing.  Psychiatric: She has a normal mood and affect. Her speech is normal and behavior is normal. Judgment and thought content normal.     EKG:  Was ordered and interpreted by me today. Shows sinus bradycardia, 57 bpm, rare PVC, LVH, no acute ST-T changes  Recent Labs: 08/28/2018: Magnesium 2.2 09/17/2018: ALT 19 05/16/2019: BUN 17; Creatinine, Ser 0.83; Hemoglobin 14.4; Platelets 143; Potassium 4.0; Sodium 140  09/17/2018: Chol/HDL Ratio 2.9; Cholesterol, Total 137; HDL 47; LDL Calculated 61; LDL Direct 61; Triglycerides 145   Estimated Creatinine Clearance: 80 mL/min (by C-G formula based on SCr of 0.83 mg/dL).   Wt Readings from Last 3 Encounters:  05/23/19 149 lb 4 oz (67.7 kg)  05/20/19 150 lb (68 kg)    05/10/19 150 lb (68 kg)     Other studies reviewed: Additional studies/records reviewed today include: summarized above  ASSESSMENT AND PLAN:  1. CAD involving the native coronary arteries status post one-vessel bypass with stable angina: Currently chest pain-free.  Recent cardiac catheterization demonstrated patent LIMA to LAD with 40% stenosis at the anastomosis site with  a now occluded native LAD, otherwise nonobstructive disease involving the LCx and essentially angiographically normal RCA.  Continue full dose aspirin.  Aggressive risk factor modification and secondary prevention.  2. Hypertension: Blood pressure has been suboptimally controlled lately, possibly exacerbated by underlying anxiety/panic attacks.  Blood pressure is mildly elevated today in the office at 150/72.  She has not yet taken any of her medications.  Increase losartan to 75 mg daily.  3. PVCs: She notes a significant increase in PVC burden when she is anxious/stressed.  Her last dose of metoprolol 25 mg was on the morning of 7/1.  Heart rate remains mildly bradycardic at 57 bpm this morning.  In this setting, coupled with significant fatigue and dizziness while previously on metoprolol, I am unable to resume metoprolol, even at a lower dose.  We will obtain a 36-hour ZIO monitor to quantify PVC burden and obtain an average heart rate.  Following resulting of this heart monitor, if able, would look to add low-dose carvedilol.  4. Hyperlipidemia: LDL of 61 from 08/2018.  Continue Crestor.  I did discuss with the patient escalation of her antilipid therapy in an effort to further reduce her LDL.  She prefers to try this naturally with diet and exercise.  We will plan to check a fasting lipid panel in the fall 2020.  5. Anxiety: Likely exacerbating her elevated BP readings.  Recommend she follow-up with PCP.  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  In this setting, I have recommended that she  remain out of work at least through the next month while the above is addressed.  Disposition: F/u with Dr. Fletcher Anon or an APP in 1 month.  Current medicines are reviewed at length with the patient today.  The patient did not have any concerns regarding medicines.  Signed, Christell Faith, PA-C 05/23/2019 9:25 AM     [place] [address] [place],  [id number] [phone]

## 2019-05-23 NOTE — Patient Instructions (Addendum)
Medication Instructions:  Your physician has recommended you make the following change in your medication:  1. INCREASE Losartan to 75 mg once daily   If you need a refill on your cardiac medications before your next appointment, please call your pharmacy.   Lab work: Hgb A1c done today.  If you have labs (blood work) drawn today and your tests are completely normal, you will receive your results only by: Marland Kitchen MyChart Message (if you have MyChart) OR . A paper copy in the mail If you have any lab test that is abnormal or we need to change your treatment, we will call you to review the results.  Testing/Procedures: Your physician has recommended that you wear a Zio monitor. This monitor is a medical device that records the heart's electrical activity. Doctors most often use these monitors to diagnose arrhythmias. Arrhythmias are problems with the speed or rhythm of the heartbeat. The monitor is a small device applied to your chest. You can wear one while you do your normal daily activities. While wearing this monitor if you have any symptoms to push the button and record what you felt. Once you have worn this monitor for the period of time provider prescribed (3 days), you will return the monitor device in the postage paid box. Once it is returned they will download the data collected and provide [place] with a report which the provider will then review and we will call you with those results. Important tips:  1. Avoid showering during the first 24 hours of wearing the monitor. 2. Avoid excessive sweating to help maximize wear time. 3. Do not submerge the device, no hot tubs, and no swimming pools. 4. Keep any lotions or oils away from the patch. 5. After 24 hours you may shower with the patch on. Take brief showers with your back facing the shower head.  6. Do not remove patch once it has been placed because that will interrupt data and decrease adhesive wear time. 7. Push the button when you have any  symptoms and write down what you were feeling. 8. Once you have completed wearing your monitor, remove and place into box which has postage paid and place in your outgoing mailbox.  9. If for some reason you have misplaced your box then call our office and we can provide another box and/or mail it off for you.        Follow-Up: At [facility], you and your health needs are our priority.  As part of our continuing mission to provide you with exceptional heart care, we have created designated Provider Care Teams.  These Care Teams include your primary Cardiologist (physician) and Advanced Practice Providers (APPs -  Physician Assistants and Nurse Practitioners) who all work together to provide you with the care you need, when you need it. You will need a follow up appointment in 1 months.  Please call our office 2 months in advance to schedule this appointment.  You may see Dr. Fletcher Anon or one of the following Advanced Practice Providers on your designated Care Team:   Murray Hodgkins, NP Christell Faith, PA-C . Marrianne Mood, PA-C  Any Other Special Instructions Will Be Listed Below (If Applicable).    Palpitations Palpitations are feelings that your heartbeat is not normal. Your heartbeat may feel like it is:  Uneven.  Faster than normal.  Fluttering.  Skipping a beat. This is usually not a serious problem. In some cases, you may need tests to rule out any serious  problems. Follow these instructions at home: Pay attention to any changes in your condition. Take these actions to help manage your symptoms: Eating and drinking  Avoid: ? Coffee, tea, soft drinks, and energy drinks. ? Chocolate. ? Alcohol. ? Diet pills. Lifestyle   Try to lower your stress. These things can help you relax: ? Yoga. ? Deep breathing and meditation. ? Exercise. ? Using words and images to create positive thoughts (guided imagery). ? Using your mind to control things in your body  (biofeedback).  Do not use drugs.  Get plenty of rest and sleep. Keep a regular bed time. General instructions   Take over-the-counter and prescription medicines only as told by your doctor.  Do not use any products that contain nicotine or tobacco, such as cigarettes and e-cigarettes. If you need help quitting, ask your doctor.  Keep all follow-up visits as told by your doctor. This is important. You may need more tests if palpitations do not go away or get worse. Contact a doctor if:  Your symptoms last more than 24 hours.  Your symptoms occur more often. Get help right away if you:  Have chest pain.  Feel short of breath.  Have a very bad headache.  Feel dizzy.  Pass out (faint). Summary  Palpitations are feelings that your heartbeat is uneven or faster than normal. It may feel like your heart is fluttering or skipping a beat.  Avoid food and drinks that may cause palpitations. These include caffeine, chocolate, and alcohol.  Try to lower your stress. Do not smoke or use drugs.  Get help right away if you faint or have chest pain, shortness of breath, a severe headache, or dizziness. This information is not intended to replace advice given to you by your health care provider. Make sure you discuss any questions you have with your health care provider. Document Released: 08/16/2008 Document Revised: 12/20/2017 Document Reviewed: 12/20/2017 Elsevier Patient Education  2020 [employer].  Premature Ventricular Contraction  A premature ventricular contraction (PVC) is a common kind of irregular heartbeat (arrhythmia). These contractions are extra heartbeats that start in the ventricles of the heart and occur too early in the normal sequence. During the PVC, the heart's normal electrical pathway is not used, so the beat is shorter and less effective. In most cases, these contractions come and go and do not require treatment. What are the causes? Common causes of the  condition include:  Smoking.  Drinking alcohol.  Certain medicines.  Some illegal drugs.  Stress.  Caffeine. Certain medical conditions can also cause PVCs:  Heart failure.  Heart attack, or coronary artery disease.  Heart valve problems.  Changes in minerals in the blood (electrolytes).  Low blood oxygen levels or high carbon dioxide levels. In many cases, the cause of this condition is not known. What are the signs or symptoms? The main symptom of this condition is fast or skipped heartbeats (palpitations). Other symptoms include:  Chest pain.  Shortness of breath.  Feeling tired.  Dizziness.  Difficulty exercising. In some cases, there are no symptoms. How is this diagnosed? This condition may be diagnosed based on:  Your medical history.  A physical exam. During the exam, the health care provider will check for irregular heartbeats.  Tests, such as: ? An ECG (electrocardiogram) to monitor the electrical activity of your heart. ? An ambulatory cardiac monitor. This device records your heartbeats for 24 hours or more. ? Stress tests to see how exercise affects your heart rhythm and  blood supply. ? An echocardiogram. This test uses sound waves (ultrasound) to produce an image of your heart. ? An electrophysiology study (EPS). This test checks for electrical problems in your heart. How is this treated? Treatment for this condition depends on any underlying conditions, the type of PVCs that you are having, and how much the symptoms are interfering with your daily life. Possible treatments include:  Avoiding things that cause premature contractions (triggers). These include caffeine and alcohol.  Taking medicines if symptoms are severe or if the extra heartbeats are frequent.  Getting treatment for underlying conditions that cause PVCs.  Having an implantable cardioverter defibrillator (ICD), if you are at risk for a serious arrhythmia. The ICD is a small  device that is inserted into your chest to monitor your heartbeat. When it senses an irregular heartbeat, it sends a shock to bring the heartbeat back to normal.  Having a procedure to destroy the portion of the heart tissue that sends out abnormal signals (catheter ablation). In some cases, no treatment is required. Follow these instructions at home: Lifestyle  Do not use any products that contain nicotine or tobacco, such as cigarettes, e-cigarettes, and chewing tobacco. If you need help quitting, ask your health care provider.  Do not use illegal drugs.  Exercise regularly. Ask your health care provider what type of exercise is safe for you.  Try to get at least 7-9 hours of sleep each night, or as much as recommended by your health care provider.  Find healthy ways to manage stress. Avoid stressful situations when possible. Alcohol use  Do not drink alcohol if: ? Your health care provider tells you not to drink. ? You are pregnant, may be pregnant, or are planning to become pregnant. ? Alcohol triggers your episodes.  If you drink alcohol: ? Limit how much you use to:  0-1 drink a day for women.  0-2 drinks a day for men.  Be aware of how much alcohol is in your drink. In the [place] one drink equals one 12 oz bottle of beer (355 mL), one 5 oz glass of wine (148 mL), or one 1 oz glass of hard liquor (44 mL). General instructions  Take over-the-counter and prescription medicines only as told by your health care provider.  If caffeine triggers episodes of PVC, do not eat, drink, or use anything with caffeine in it.  Keep all follow-up visits as told by your health care provider. This is important. Contact a health care provider if you:  Feel palpitations. Get help right away if you:  Have chest pain.  Have shortness of breath.  Have sweating for no reason.  Have nausea and vomiting.  Become light-headed or you faint. Summary  A premature ventricular contraction  (PVC) is a common kind of irregular heartbeat (arrhythmia).  In most cases, these contractions come and go and do not require treatment.  You may need to wear an ambulatory cardiac monitor. This records your heartbeats for 24 hours or more.  Treatment depends on any underlying conditions, the type of PVCs that you are having, and how much the symptoms are interfering with your daily life. This information is not intended to replace advice given to you by your health care provider. Make sure you discuss any questions you have with your health care provider. Document Released: 06/24/2004 Document Revised: 08/02/2018 Document Reviewed: 08/02/2018 Elsevier Patient Education  2020 [employer].

## 2019-05-24 LAB — HEMOGLOBIN A1C
Est. average glucose Bld gHb Est-mCnc: 117 mg/dL
Hgb A1c MFr Bld: 5.7 % — ABNORMAL HIGH (ref 4.8–5.6)

## 2019-05-30 ENCOUNTER — Ambulatory Visit: Admitting: Physician Assistant

## 2019-06-10 ENCOUNTER — Telehealth: Payer: Self-pay | Admitting: *Deleted

## 2019-06-10 NOTE — Telephone Encounter (Signed)
-----   Message from Rise Mu, PA-C sent at 06/08/2019  7:43 AM EDT ----- Cardiac monitor showed sinus rhythm with an average heart rate of 66 bpm. Frequent PACs and rare PVCs. Minimum heart rate 44 bpm at 3:31 AM. Maximum heart rate of 137 bpm at 9:52 AM. Patient triggered events were associated with NSR with PACs. Please start Coreg 3.125 mg bid to reduced atrial ectopy. This will slow the heart rate some, though not as much as metoprolol.

## 2019-06-10 NOTE — Telephone Encounter (Signed)
No answer. Left message to call back.   

## 2019-06-12 NOTE — Telephone Encounter (Signed)
No answer. Left message to call back.   

## 2019-06-14 MED ORDER — CARVEDILOL 3.125 MG PO TABS: 3.1250 mg | ORAL_TABLET | Freq: Two times a day (BID) | ORAL | 2 refills | Status: DC

## 2019-06-14 NOTE — Telephone Encounter (Signed)
No answer. Left detail message with results, ok per DPR, and to call back if any questions.  Went ahead and sent in rx for coreg.

## 2019-07-09 ENCOUNTER — Ambulatory Visit (INDEPENDENT_AMBULATORY_CARE_PROVIDER_SITE_OTHER): Admitting: Cardiovascular Disease

## 2019-07-09 ENCOUNTER — Other Ambulatory Visit: Payer: Self-pay

## 2019-07-09 VITALS — BP 176/78 | HR 59 | Ht 66.5 in | Wt 148.0 lb

## 2019-07-09 DIAGNOSIS — I2510 Atherosclerotic heart disease of native coronary artery without angina pectoris: Secondary | ICD-10-CM

## 2019-07-09 DIAGNOSIS — E785 Hyperlipidemia, unspecified: Secondary | ICD-10-CM

## 2019-07-09 DIAGNOSIS — I10 Essential (primary) hypertension: Secondary | ICD-10-CM

## 2019-07-09 MED ORDER — LOSARTAN POTASSIUM 100 MG PO TABS: 100.0000 mg | ORAL_TABLET | Freq: Every day | ORAL | 3 refills | Status: DC

## 2019-07-09 NOTE — Progress Notes (Signed)
Cardiology Office Note   Date:  07/09/2019   ID:  Kim Villarreal, DOB 1971/02/23, MRN [id number]  PCP:  Derinda Late, MD  Cardiologist:   [name], MD   Chief Complaint  Patient presents with  . Other    1 month follow up from [facility]IO. Meds reviewed verbaly with patient.       History of Present Illness: Kim Villarreal is a 48 y.o. female who presents for a follow-up visit regarding coronary artery disease.  She is a previous smoker . She has no diabetes or hyperlipidemia.  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 She did have a lot of postoperative pain especially in her neck that subsequently improved.    She had recurrent angina in June in the setting of significantly elevated blood pressure with exercise.  She underwent cardiac catheterization on June 29 which showed occluded proximal LAD with patent LIMA to LAD with moderate anastomosis lesion estimated to be 40%.  EF was normal with mildly elevated left ventricular end-diastolic pressure.  She was started on metoprolol for blood pressure and anginal control.  She had some low heart rate reading on her Apple Watch with some dizziness.  Metoprolol was held.  She underwent a 3-day ZIO patch monitor which showed normal sinus rhythm with an average heart rate of 66 bpm.  Frequent PACs and rare PVCs.  She was prescribed carvedilol 3.125 mg twice daily but has not started the medications.  Actually she saw her primary care physician for uncontrolled anxiety and was prescribed Effexor.  Since she started this medication, her palpitations have resolved completely.  In addition, she has been dealing with hot flashes for 6 months and these have improved as well.  She has not had any recurrent chest pain.  Past Medical History:  Diagnosis Date  . Anemia    "as a child"  . Anxiety   . Bursitis   . Childhood asthma   . Chronic lower back pain   . Coronary artery  disease    a. 09/2016: cath showing 95% stenosis of the mid-LAD. Xience 2.5x59mm DES placed, remaining left and right coronary tree angiographically normal, lip swelling noted at the end of the case without rash or respiratory distress, treated with IV Solu-Medrol and Benadryl.  Marland Kitchen History of nuclear stress test    a. 07/2016: no sig ischemia, nl wall motion, EF 72%, small defect along mid to apical anteroseptal wall c/w breast attenuation artifact, low risk study  . Hypertension   . Migraine    "it was a bout; beginning of 2017; for ~ 1 month; went away" (05/23/2018)  . Mitral regurgitation    a. echo 08/2016: vigorous EF 65-70%, nl wall motion, nl LV diastolic fxn, mild MR  . Tendonitis     Past Surgical History:  Procedure Laterality Date  . CARDIAC CATHETERIZATION N/A 10/05/2016   Procedure: Left Heart Cath and Coronary Angiography;  Surgeon: [place] Hampshire, MD;  Location: [place] [facility];  Service: Cardiovascular;  Laterality: N/A;  . CARDIAC CATHETERIZATION N/A 10/05/2016   Procedure: Coronary Stent Intervention;  Surgeon: [place] Hampshire, MD;  Location: [place] [facility];  Service: Cardiovascular;  Laterality: N/A;  . CESAREAN SECTION  2003; 2005  . CORONARY ANGIOPLASTY WITH STENT PLACEMENT  05/23/2018   "2 stents"  . CORONARY ARTERY BYPASS GRAFT N/A 08/27/2018   Procedure: CORONARY ARTERY BYPASS GRAFTING (CABG) x 1 using LEFT  INTERNAL MAMMARY ARTERY;  Surgeon: Gaye Pollack, MD;  Location: [place];  Service: Open Heart Surgery;  Laterality: N/A;  . CORONARY STENT INTERVENTION N/A 05/23/2018   Procedure: CORONARY STENT INTERVENTION;  Surgeon: [place] Hampshire, MD;  Location: [place] [facility];  Service: Cardiovascular;  Laterality: N/A;  mid LAD  . ECTOPIC PREGNANCY SURGERY  1991  . LEFT HEART CATH AND CORONARY ANGIOGRAPHY N/A 05/23/2018   Procedure: LEFT HEART CATH AND CORONARY ANGIOGRAPHY;  Surgeon: [place] Hampshire, MD;  Location: [place] [facility];  Service: Cardiovascular;   Laterality: N/A;  . LEFT HEART CATH AND CORONARY ANGIOGRAPHY N/A 08/22/2018   Procedure: LEFT HEART CATH AND CORONARY ANGIOGRAPHY;  Surgeon: [place] Hampshire, MD;  Location: [place] [facility];  Service: Cardiovascular;  Laterality: N/A;  . LEFT HEART CATH AND CORS/GRAFTS ANGIOGRAPHY Left 05/20/2019   Procedure: LEFT HEART CATH AND CORS/GRAFTS ANGIOGRAPHY;  Surgeon: [place] Hampshire, MD;  Location: [place] [facility];  Service: Cardiovascular;  Laterality: Left;  . TEE WITHOUT CARDIOVERSION N/A 08/27/2018   Procedure: TRANSESOPHAGEAL ECHOCARDIOGRAM (TEE);  Surgeon: Gaye Pollack, MD;  Location: [place];  Service: Open Heart Surgery;  Laterality: N/A;     Current Outpatient Medications  Medication Sig Dispense Refill  . aspirin 325 MG EC tablet Take 1 tablet (325 mg total) by mouth daily.    Marland Kitchen ibuprofen (ADVIL) 200 MG tablet Take 200 mg by mouth every 6 (six) hours as needed for headache or moderate pain.    Marland Kitchen losartan (COZAAR) 100 MG tablet Take 1 tablet (100 mg total) by mouth daily. 90 tablet 3  . nitrofurantoin (MACRODANTIN) 25 MG capsule Take 25 mg by mouth daily as needed (for UTI prevention).     . nitroGLYCERIN (NITROSTAT) 0.4 MG SL tablet Place 1 tablet (0.4 mg total) under the tongue every 5 (five) minutes as needed for chest pain (Do not tak more than 3 tablets in any 24 hour period). 30 tablet 0  . rosuvastatin (CRESTOR) 5 MG tablet TAKE 1 TABLET(5 MG) BY MOUTH DAILY (Patient taking differently: Take 5 mg by mouth daily. ) 90 tablet 2  . venlafaxine XR (EFFEXOR-XR) 37.5 MG 24 hr capsule Take 37.5 mg by mouth daily.     No current facility-administered medications for this visit.     Allergies:   Codeine and Contrast media [iodinated diagnostic agents]    Social History:  The patient  reports that she quit smoking about 13 years ago. Her smoking use included cigarettes. She has a 11.25 pack-year smoking history. She has never used smokeless tobacco. She reports current alcohol  use. She reports that she does not use drugs.   Family History:  The patient's family history includes Heart disease in her mother; Parkinson's disease in her father.    ROS:  Please see the history of present illness.   Otherwise, review of systems are positive for none.   All other systems are reviewed and negative.    PHYSICAL EXAM: VS:  BP (!) 176/78 (BP Location: Left Arm, Patient Position: Sitting, Cuff Size: Normal)   Pulse (!) 59   Ht 5' 6.5" (1.689 m)   Wt 148 lb (67.1 kg)   BMI 23.53 kg/m  , BMI Body mass index is 23.53 kg/m. GEN: Well nourished, well developed, in no acute distress  HEENT: normal  Neck: no JVD, carotid bruits, or masses Cardiac: RRR; no  rubs, or gallops,no edema . 1/6 systolic ejection murmur in the aortic area.  Respiratory:  clear to auscultation bilaterally, normal work of breathing GI: soft, nontender, nondistended, + BS MS: no deformity or atrophy  Skin: warm and dry, no rash Neuro:  Strength and sensation are intact Psych: euthymic mood, full affect Femoral pulses diminished bilaterally.  EKG:  EKG is not ordered today.    Recent Labs: 08/28/2018: Magnesium 2.2 09/17/2018: ALT 19 05/16/2019: BUN 17; Creatinine, Ser 0.83; Hemoglobin 14.4; Platelets 143; Potassium 4.0; Sodium 140    Lipid Panel    Component Value Date/Time   CHOL 137 09/17/2018 1051   TRIG 145 09/17/2018 1051   HDL 47 09/17/2018 1051   CHOLHDL 2.9 09/17/2018 1051   CHOLHDL 2.8 03/09/2017 0855   VLDL 11 03/09/2017 0855   LDLCALC 61 09/17/2018 1051   LDLDIRECT 61 09/17/2018 1051      Wt Readings from Last 3 Encounters:  07/09/19 148 lb (67.1 kg)  05/23/19 149 lb 4 oz (67.7 kg)  05/20/19 150 lb (68 kg)      PAD Screen 08/05/2016  Previous PAD dx? No  Previous surgical procedure? No  Pain with walking? No  Feet/toe relief with dangling? No  Painful, non-healing ulcers? No  Extremities discolored? No      ASSESSMENT AND PLAN:  1.  Coronary artery disease  involving native coronary arteries without angina: She is status post one-vessel CABG with LIMA to LAD in October 2019.   Continue aspirin 325 mg once daily.  Recent cardiac cath showed patent LIMA to LAD.  There was a moderate anastomosis lesion which did not seem to be obstructive and does not require revascularization. Her symptoms improved significantly after she was started on Effexor.  I suspect that anxiety was significantly contributing to her symptoms.  2. Hyperlipidemia: She is tolerating small dose rosuvastatin.  Most recent lipid profile showed an LDL of 61.  3. Essential hypertension: Blood pressure is elevated.  Blood pressure is much better at home but even at home it is not optimally controlled.  I elected to increase losartan to 100 mg once daily.  I am going to hold off on carvedilol given low resting heart rate.  If blood pressure remains above 417 systolic, I will consider adding small dose amlodipine.  4.  Palpitations and premature beats.  Resolved completely after she was started on Effexor.  Monitor showed only PACs which are benign.  Disposition:   FU with me in 4 months.  Signed,  [name], MD  07/09/2019 3:26 PM    [place]

## 2019-07-09 NOTE — Patient Instructions (Signed)
Medication Instructions:  Your physician has recommended you make the following change in your medication:   INCREASE Losartan to 100mg  daily. An Rx has been sent to your pharmacy.  If you need a refill on your cardiac medications before your next appointment, please call your pharmacy.   Lab work: None ordered If you have labs (blood work) drawn today and your tests are completely normal, you will receive your results only by: Marland Kitchen MyChart Message (if you have MyChart) OR . A paper copy in the mail If you have any lab test that is abnormal or we need to change your treatment, we will call you to review the results.  Testing/Procedures: None ordered  Follow-Up: At [facility], you and your health needs are our priority.  As part of our continuing mission to provide you with exceptional heart care, we have created designated Provider Care Teams.  These Care Teams include your primary Cardiologist (physician) and Advanced Practice Providers (APPs -  Physician Assistants and Nurse Practitioners) who all work together to provide you with the care you need, when you need it. You will need a follow up appointment in 4 months.  Please call our office 2 months in advance to schedule this appointment.  You may see  Dr. Fletcher Anon  or one of the following Advanced Practice Providers on your designated Care Team:   Murray Hodgkins, NP Christell Faith, PA-C . Marrianne Mood, PA-C  Any Other Special Instructions Will Be Listed Below (If Applicable). N/A

## 2019-07-16 NOTE — Telephone Encounter (Signed)
Patient has seen Dr Fletcher Anon since this phone call on 07/09/19. Closing encounter.

## 2019-07-26 MED ORDER — AMLODIPINE BESYLATE 5 MG PO TABS: 5.0000 mg | ORAL_TABLET | Freq: Every day | ORAL | 5 refills | Status: DC

## 2019-08-31 IMAGING — DX DG CHEST 1V PORT
1 series · 1 of 1 positions shown · non-contrast
Comparison: Chest x-ray of August 26, 2018

CLINICAL DATA: Status post CABG today

EXAM:
PORTABLE CHEST 1 VIEW

[chest ap]
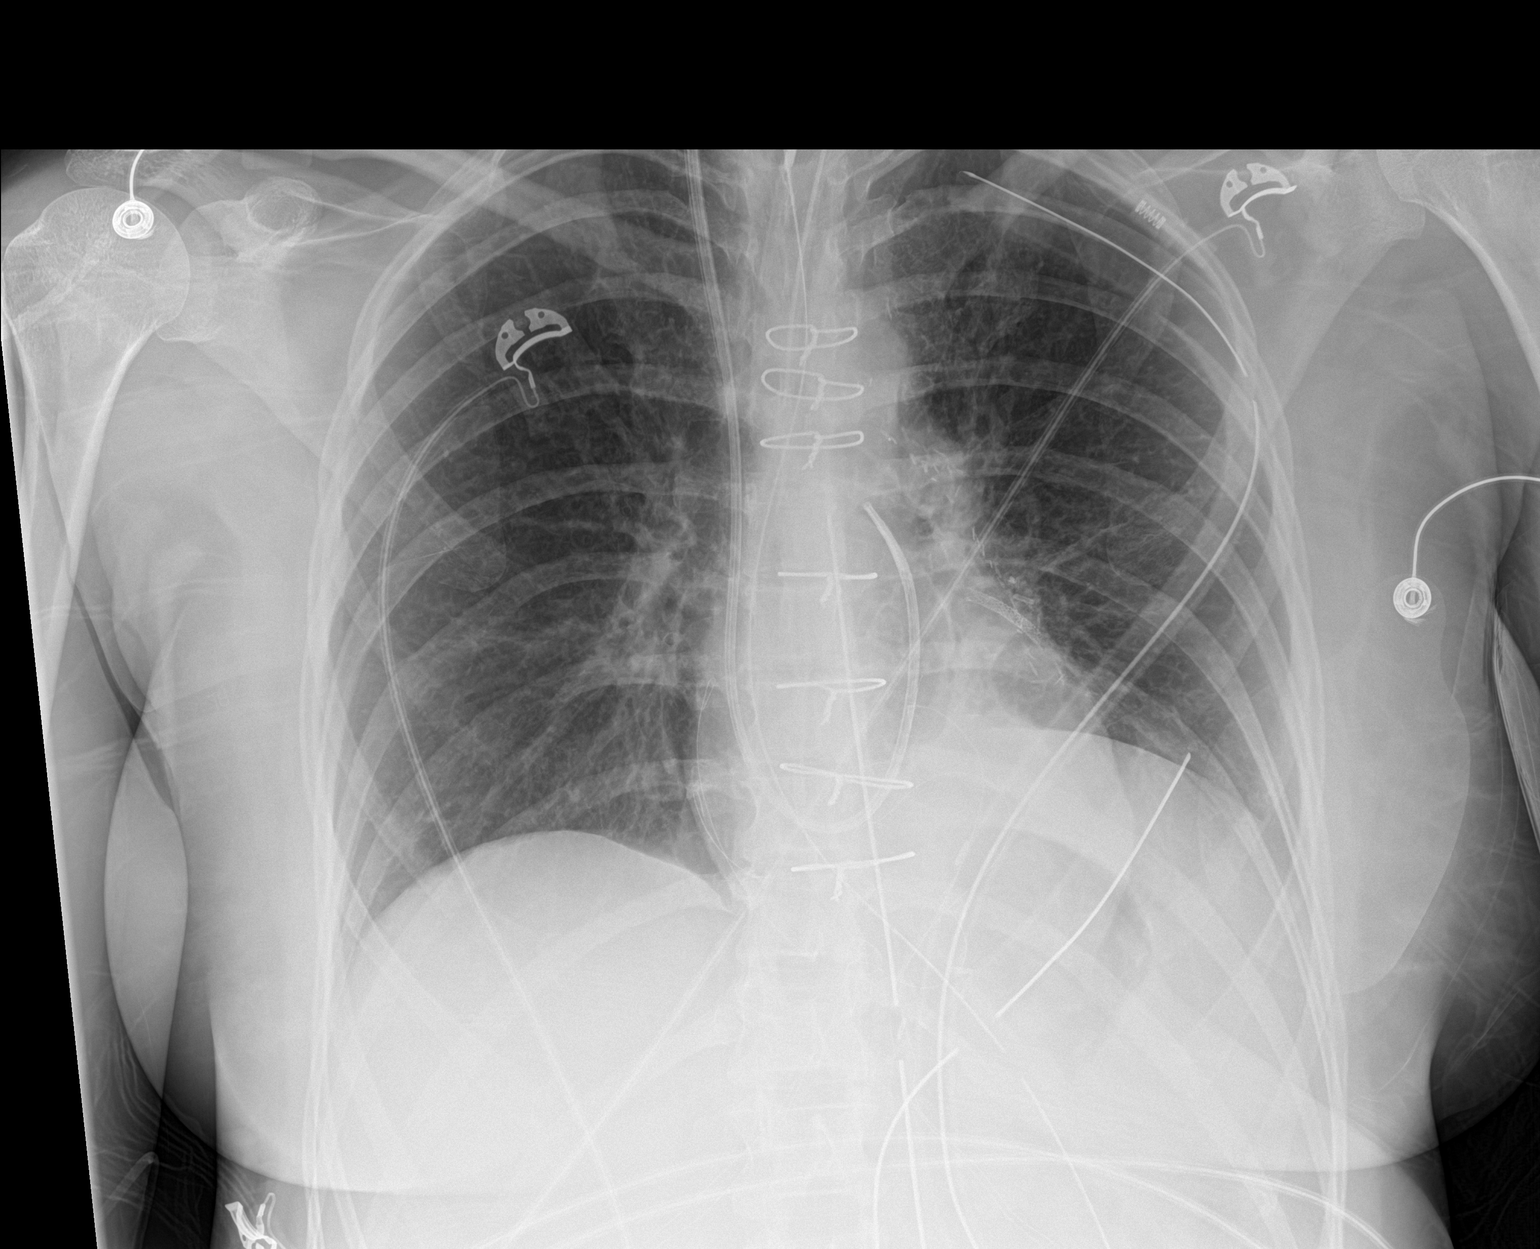

[1 of 1 positions shown; findings below may reference images not displayed]

FINDINGS: The right lung is well-expanded and clear. On the left there is mild
volume loss. There is no pneumothorax nor large pleural effusion.
The heart and pulmonary vascularity are normal. The Swan-Ganz
catheter tip projects in the proximal main pulmonary outflow tract.
The endotracheal tube tip projects approximately 2 cm above the
carina. The esophagogastric tube tip and proximal port project below
the GE junction. 2 chest tubes and a mediastinal drain are in
reasonable position on the left.
IMPRESSION: Postsurgical changes. No CHF or pneumothorax. Probable left basilar
atelectasis. The support tubes are in reasonable position.

## 2019-09-02 IMAGING — DX DG CHEST 1V PORT
1 series · 1 of 1 positions shown · non-contrast
Comparison: 08/28/2018 and older studies.

CLINICAL DATA: Status post CABG surgery.

EXAM:
PORTABLE CHEST 1 VIEW

[chest ap]
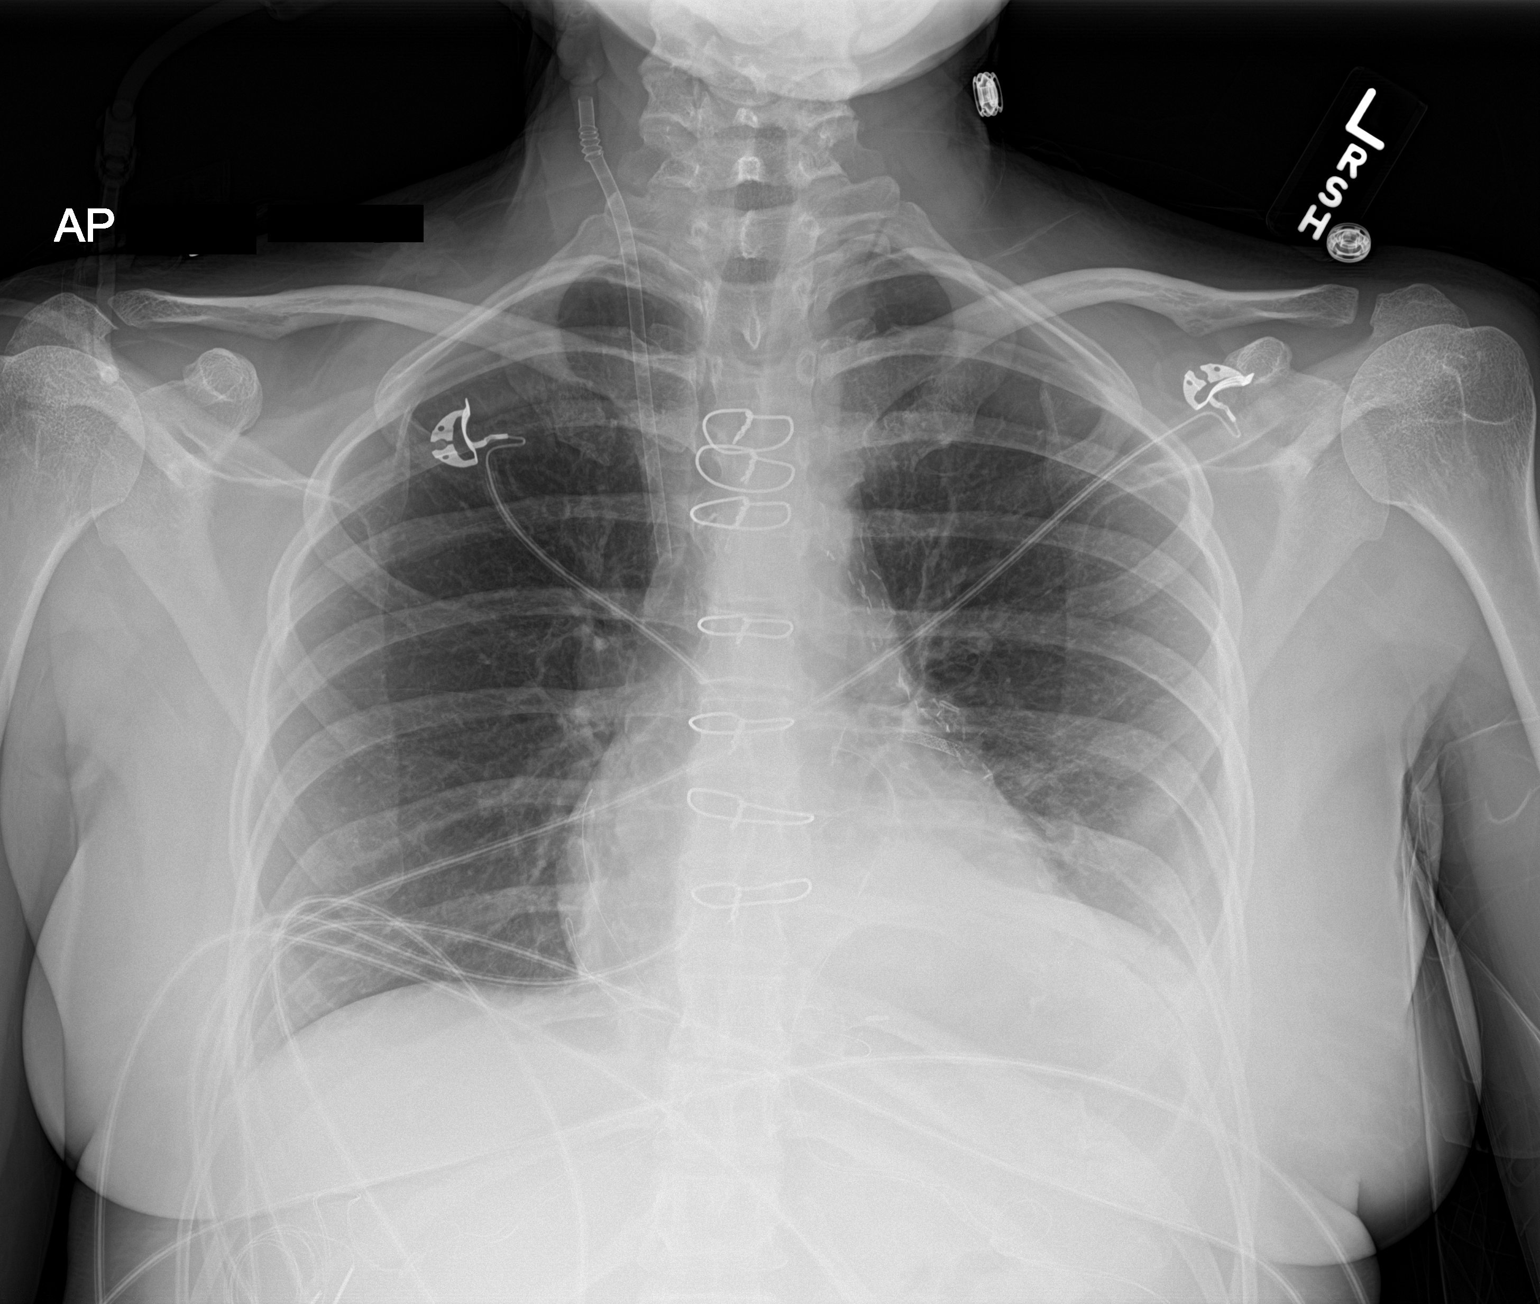

[1 of 1 positions shown; findings below may reference images not displayed]

FINDINGS: Since the prior exam, all lines and tubes have been removed with the
exception of the right internal jugular introducer sheath.

Cardiac silhouette is normal in size. There is no mediastinal
widening. Left coronary artery stent is stable.

There is persistent left lung base opacity consistent with a
combination of a small effusion and atelectasis. Remainder of the
lungs is clear.

No pneumothorax.
IMPRESSION: 1. No acute findings or evidence of an operative complication.
2. Persistent left lung base opacity consistent with a combination
of atelectasis and a small effusion.
3. No pulmonary edema, mediastinal widening or pneumothorax.
4. Remaining right internal jugular introducer sheath is well
positioned.

## 2019-10-05 IMAGING — CR DG CHEST 2V
2 series · 2 of 2 positions shown · non-contrast
Comparison: 08/29/2018

CLINICAL DATA: CABG

EXAM:
CHEST - 2 VIEW

[w chest pa]
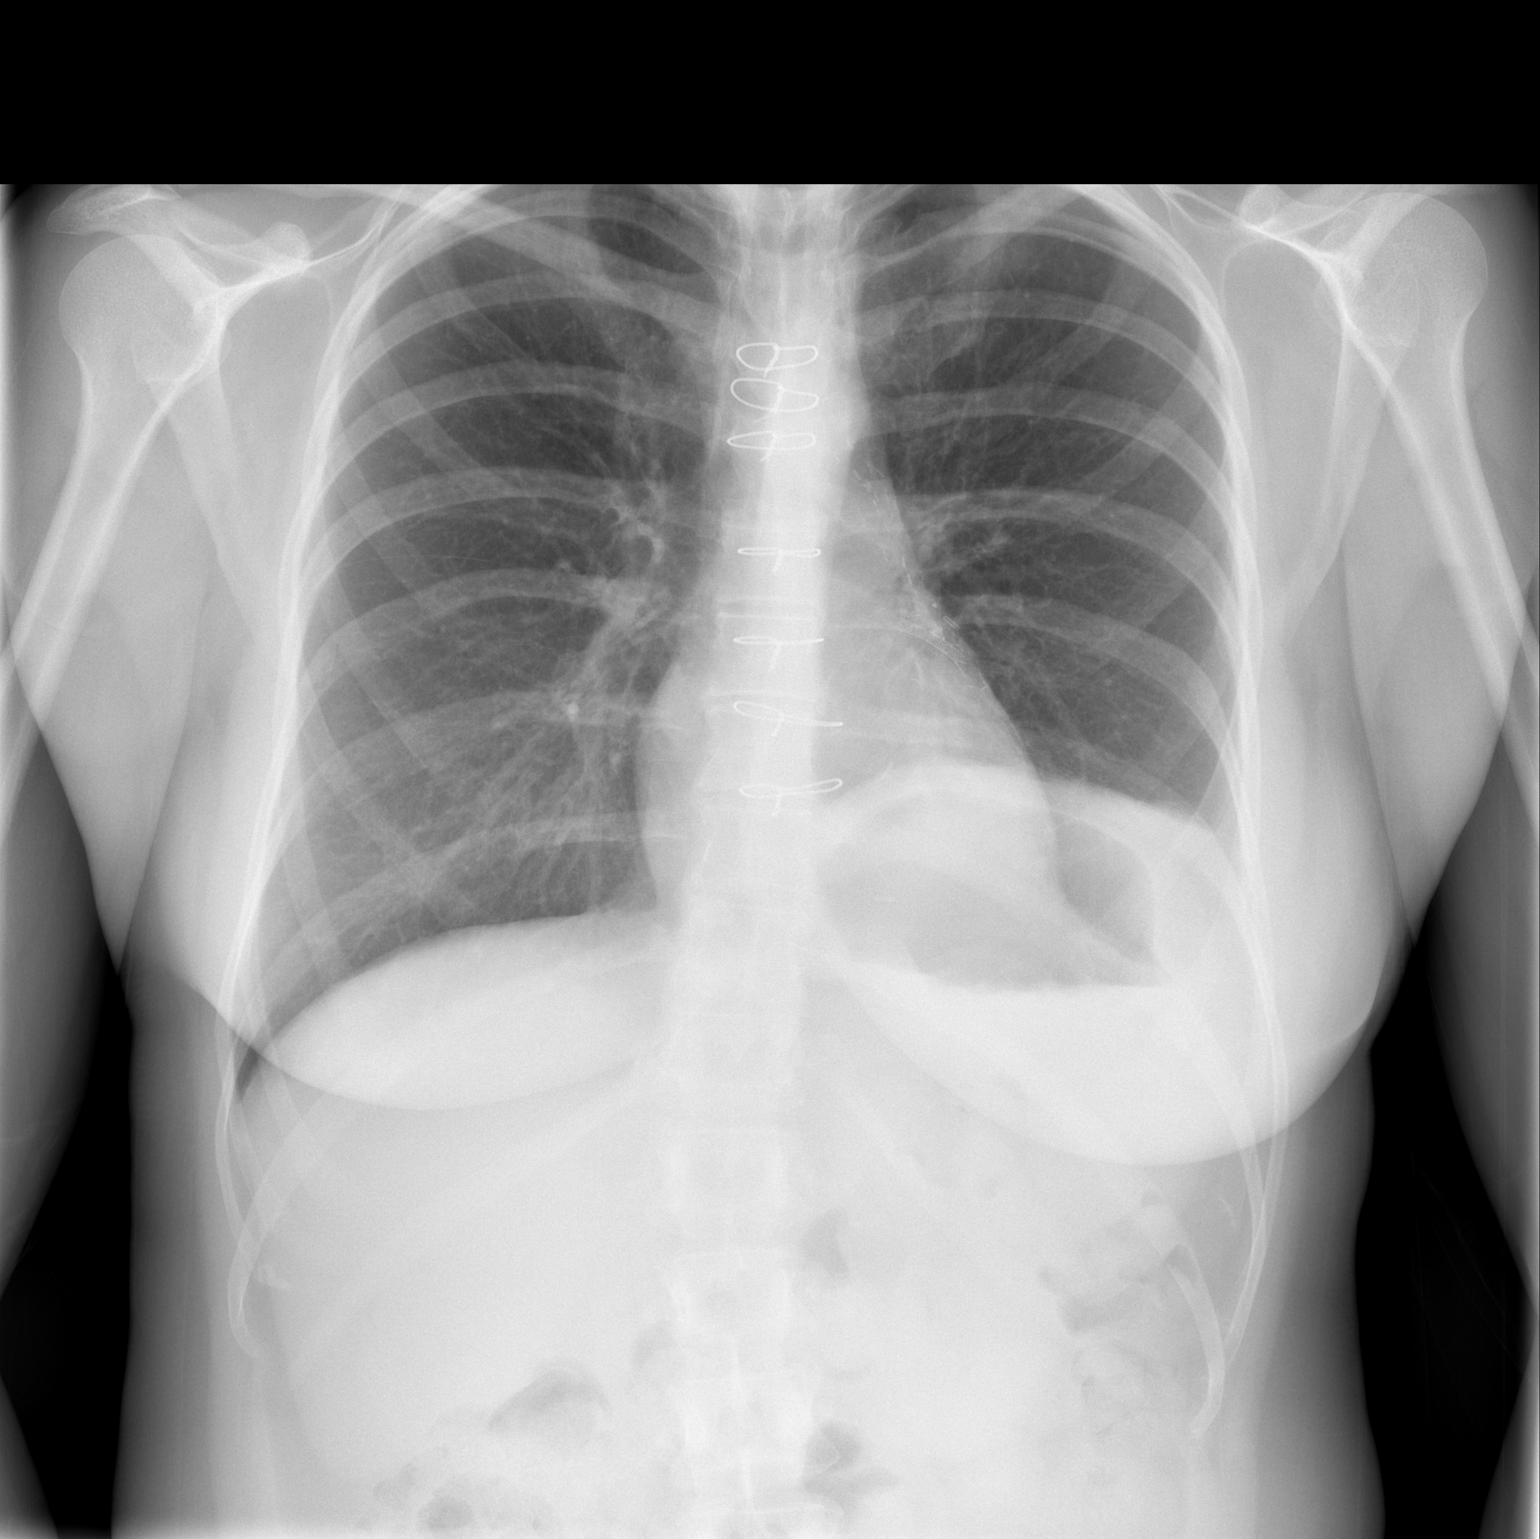

[w chest lat]
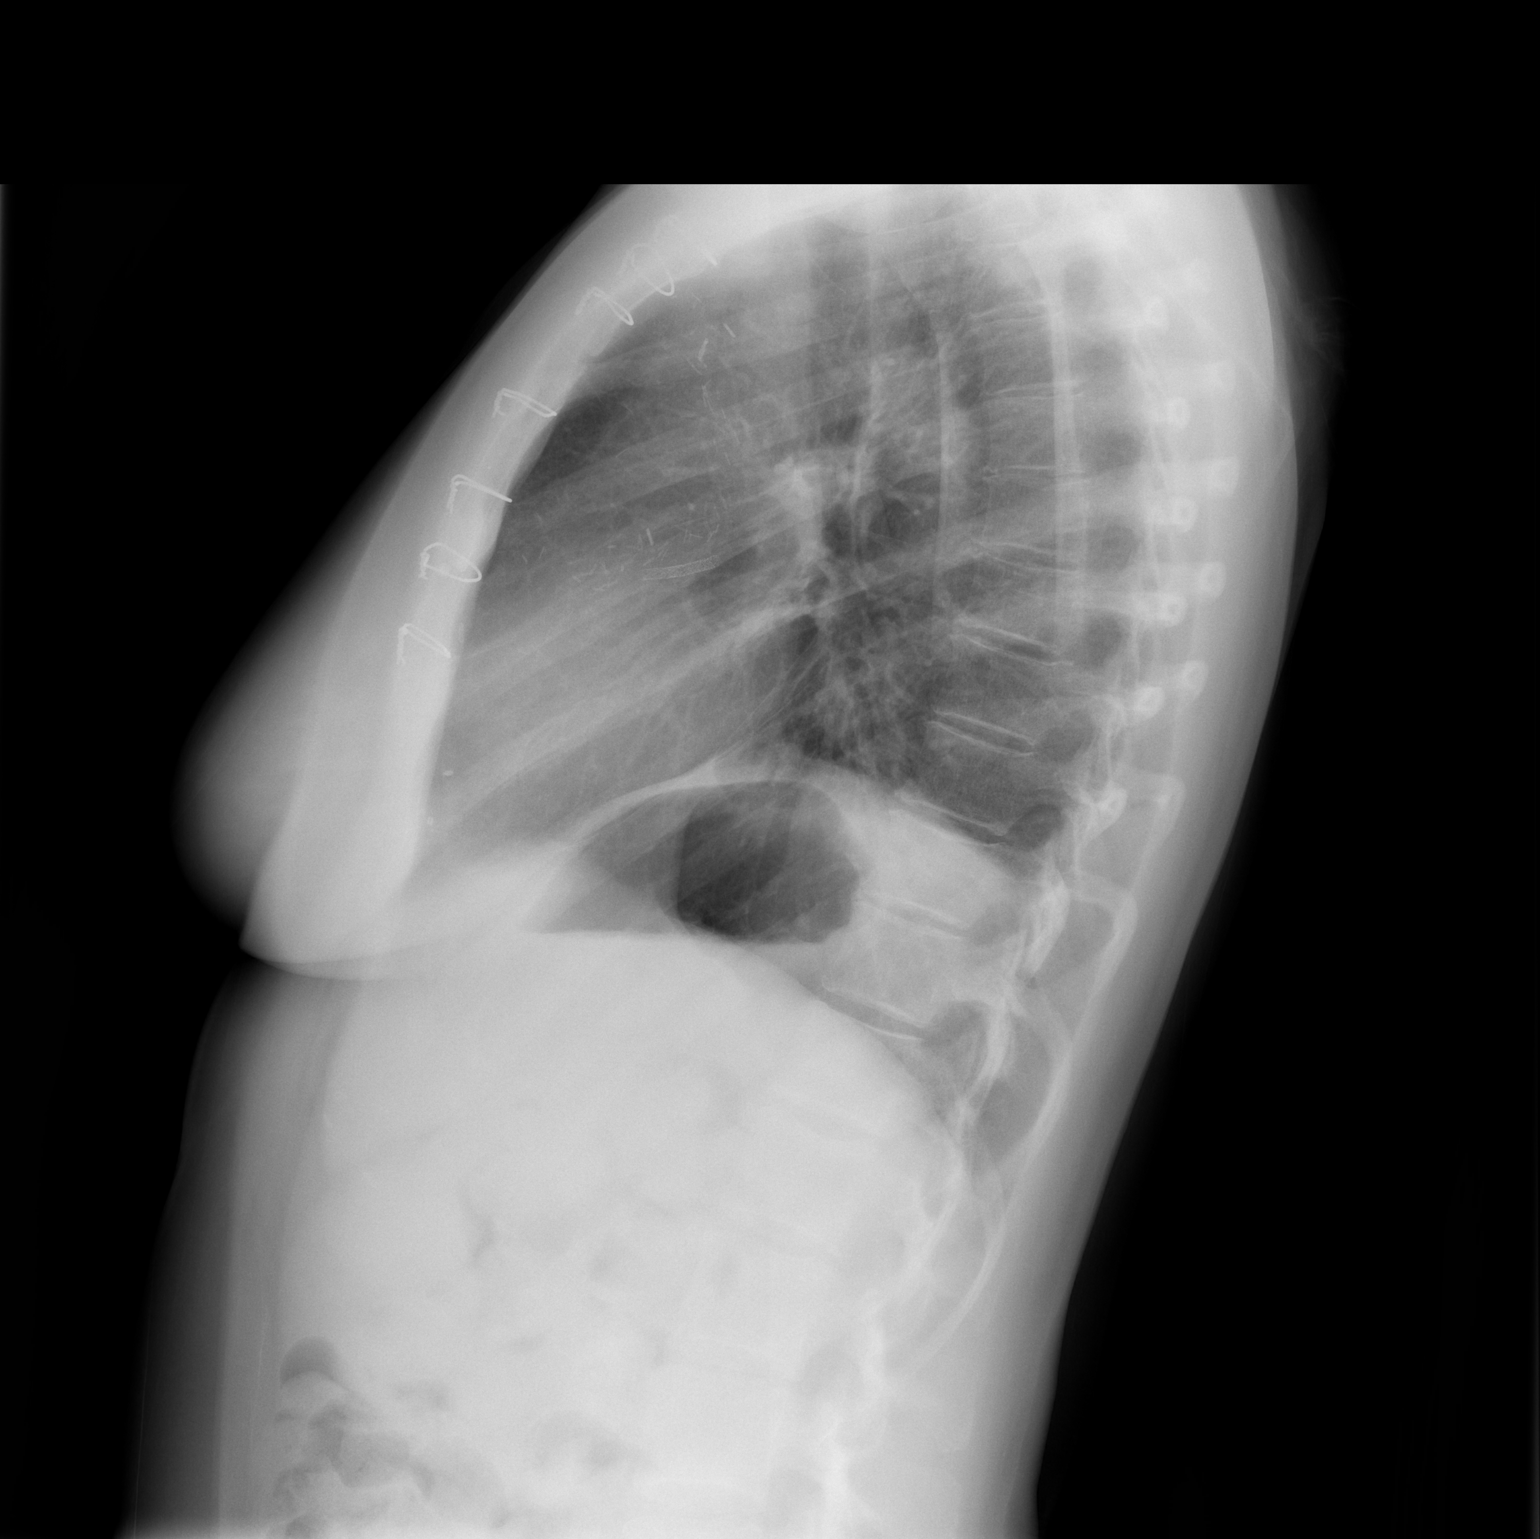

[2 of 2 positions shown; findings below may reference images not displayed]

FINDINGS: Improved aeration left lung base with mild residual atelectasis and
small effusion. Right lung clear. No heart failure or pneumothorax.
Postop CABG. Left coronary stent.
IMPRESSION: Interval improvement in left lower lobe atelectasis and effusion.

## 2019-11-08 ENCOUNTER — Encounter: Payer: Self-pay | Admitting: Cardiovascular Disease

## 2019-11-08 ENCOUNTER — Ambulatory Visit (INDEPENDENT_AMBULATORY_CARE_PROVIDER_SITE_OTHER): Admitting: Cardiovascular Disease

## 2019-11-08 ENCOUNTER — Other Ambulatory Visit: Payer: Self-pay

## 2019-11-08 VITALS — BP 142/62 | HR 64 | Ht 66.5 in | Wt 150.5 lb

## 2019-11-08 DIAGNOSIS — I10 Essential (primary) hypertension: Secondary | ICD-10-CM | POA: Diagnosis not present

## 2019-11-08 DIAGNOSIS — I2510 Atherosclerotic heart disease of native coronary artery without angina pectoris: Secondary | ICD-10-CM

## 2019-11-08 DIAGNOSIS — E785 Hyperlipidemia, unspecified: Secondary | ICD-10-CM | POA: Diagnosis not present

## 2019-11-08 NOTE — Patient Instructions (Addendum)
Medication Instructions:  No changes  *If you need a refill on your cardiac medications before your next appointment, please call your pharmacy*  Lab Work: CBC, CMP, and Lipid panel done today.  If you have labs (blood work) drawn today and your tests are completely normal, you will receive your results only by: Marland Kitchen MyChart Message (if you have MyChart) OR . A paper copy in the mail If you have any lab test that is abnormal or we need to change your treatment, we will call you to review the results.  Testing/Procedures: None  Follow-Up: At [facility], you and your health needs are our priority.  As part of our continuing mission to provide you with exceptional heart care, we have created designated Provider Care Teams.  These Care Teams include your primary Cardiologist (physician) and Advanced Practice Providers (APPs -  Physician Assistants and Nurse Practitioners) who all work together to provide you with the care you need, when you need it.  Your next appointment:   6 month(s)  The format for your next appointment:   In Person  Provider:    You may see Dr. [name] or one of the following Advanced Practice Providers on your designated Care Team:    Murray Hodgkins, NP  Christell Faith, PA-C  Marrianne Mood, PA-C    Heart-Healthy Eating Plan Heart-healthy meal planning includes:  Eating less unhealthy fats.  Eating more healthy fats.  Making other changes in your diet. Talk with your doctor or a diet specialist (dietitian) to create an eating plan that is right for you. What is my plan? Your doctor may recommend an eating plan that includes:  Total fat: ______% or less of total calories a day.  Saturated fat: ______% or less of total calories a day.  Cholesterol: less than _________mg a day. What are tips for following this plan? Cooking Avoid frying your food. Try to bake, boil, grill, or broil it instead. You can also reduce fat by:  Removing the  skin from poultry.  Removing all visible fats from meats.  Steaming vegetables in water or broth. Meal planning   At meals, divide your plate into four equal parts: ? Fill one-half of your plate with vegetables and green salads. ? Fill one-fourth of your plate with whole grains. ? Fill one-fourth of your plate with lean protein foods.  Eat 4-5 servings of vegetables per day. A serving of vegetables is: ? 1 cup of raw or cooked vegetables. ? 2 cups of raw leafy greens.  Eat 4-5 servings of fruit per day. A serving of fruit is: ? 1 medium whole fruit. ?  cup of dried fruit. ?  cup of fresh, frozen, or canned fruit. ?  cup of 100% fruit juice.  Eat more foods that have soluble fiber. These are apples, broccoli, carrots, beans, peas, and barley. Try to get 20-30 g of fiber per day.  Eat 4-5 servings of nuts, legumes, and seeds per week: ? 1 serving of dried beans or legumes equals  cup after being cooked. ? 1 serving of nuts is  cup. ? 1 serving of seeds equals 1 tablespoon. General information  Eat more home-cooked food. Eat less restaurant, buffet, and fast food.  Limit or avoid alcohol.  Limit foods that are high in starch and sugar.  Avoid fried foods.  Lose weight if you are overweight.  Keep track of how much salt (sodium) you eat. This is important if you have high blood pressure. Ask your  doctor to tell you more about this.  Try to add vegetarian meals each week. Fats  Choose healthy fats. These include olive oil and canola oil, flaxseeds, walnuts, almonds, and seeds.  Eat more omega-3 fats. These include salmon, mackerel, sardines, tuna, flaxseed oil, and ground flaxseeds. Try to eat fish at least 2 times each week.  Check food labels. Avoid foods with trans fats or high amounts of saturated fat.  Limit saturated fats. ? These are often found in animal products, such as meats, butter, and cream. ? These are also found in plant foods, such as palm oil,  palm kernel oil, and coconut oil.  Avoid foods with partially hydrogenated oils in them. These have trans fats. Examples are stick margarine, some tub margarines, cookies, crackers, and other baked goods. What foods can I eat? Fruits All fresh, canned (in natural juice), or frozen fruits. Vegetables Fresh or frozen vegetables (raw, steamed, roasted, or grilled). Green salads. Grains Most grains. Choose whole wheat and whole grains most of the time. Rice and pasta, including brown rice and pastas made with whole wheat. Meats and other proteins Lean, well-trimmed beef, veal, pork, and lamb. Chicken and [place] without skin. All fish and shellfish. Wild duck, rabbit, pheasant, and venison. Egg whites or low-cholesterol egg substitutes. Dried beans, peas, lentils, and tofu. Seeds and most nuts. Dairy Low-fat or nonfat cheeses, including ricotta and mozzarella. Skim or 1% milk that is liquid, powdered, or evaporated. Buttermilk that is made with low-fat milk. Nonfat or low-fat yogurt. Fats and oils Non-hydrogenated (trans-free) margarines. Vegetable oils, including soybean, sesame, sunflower, olive, peanut, safflower, corn, canola, and cottonseed. Salad dressings or mayonnaise made with a vegetable oil. Beverages Mineral water. Coffee and tea. Diet carbonated beverages. Sweets and desserts Sherbet, gelatin, and fruit ice. Small amounts of dark chocolate. Limit all sweets and desserts. Seasonings and condiments All seasonings and condiments. The items listed above may not be a complete list of foods and drinks you can eat. Contact a dietitian for more options. What foods should I avoid? Fruits Canned fruit in heavy syrup. Fruit in cream or butter sauce. Fried fruit. Limit coconut. Vegetables Vegetables cooked in cheese, cream, or butter sauce. Fried vegetables. Grains Breads that are made with saturated or trans fats, oils, or whole milk. Croissants. Sweet rolls. Donuts. High-fat crackers,  such as cheese crackers. Meats and other proteins Fatty meats, such as hot dogs, ribs, sausage, bacon, rib-eye roast or steak. High-fat deli meats, such as salami and bologna. Caviar. Domestic duck and goose. Organ meats, such as liver. Dairy Cream, sour cream, cream cheese, and creamed cottage cheese. Whole-milk cheeses. Whole or 2% milk that is liquid, evaporated, or condensed. Whole buttermilk. Cream sauce or high-fat cheese sauce. Yogurt that is made from whole milk. Fats and oils Meat fat, or shortening. Cocoa butter, hydrogenated oils, palm oil, coconut oil, palm kernel oil. Solid fats and shortenings, including bacon fat, salt pork, lard, and butter. Nondairy cream substitutes. Salad dressings with cheese or sour cream. Beverages Regular sodas and juice drinks with added sugar. Sweets and desserts Frosting. Pudding. Cookies. Cakes. Pies. Milk chocolate or white chocolate. Buttered syrups. Full-fat ice cream or ice cream drinks. The items listed above may not be a complete list of foods and drinks to avoid. Contact a dietitian for more information. Summary  Heart-healthy meal planning includes eating less unhealthy fats, eating more healthy fats, and making other changes in your diet.  Eat a balanced diet. This includes fruits and vegetables, low-fat or  nonfat dairy, lean protein, nuts and legumes, whole grains, and heart-healthy oils and fats. This information is not intended to replace advice given to you by your health care provider. Make sure you discuss any questions you have with your health care provider. Document Released: 05/08/2012 Document Revised: 01/11/2018 Document Reviewed: 12/15/2017 Elsevier Patient Education  2020 [place] and Cholesterol Restricted Eating Plan Getting too much fat and cholesterol in your diet may cause health problems. Choosing the right foods helps keep your fat and cholesterol at normal levels. This can keep you from getting certain  diseases. Your doctor may recommend an eating plan that includes:  Total fat: ______% or less of total calories a day.  Saturated fat: ______% or less of total calories a day.  Cholesterol: less than _________mg a day.  Fiber: ______g a day. What are tips for following this plan? Meal planning  At meals, divide your plate into four equal parts: ? Fill one-half of your plate with vegetables and green salads. ? Fill one-fourth of your plate with whole grains. ? Fill one-fourth of your plate with low-fat (lean) protein foods.  Eat fish that is high in omega-3 fats at least two times a week. This includes mackerel, tuna, sardines, and salmon.  Eat foods that are high in fiber, such as whole grains, beans, apples, broccoli, carrots, peas, and barley. General tips   Work with your doctor to lose weight if you need to.  Avoid: ? Foods with added sugar. ? Fried foods. ? Foods with partially hydrogenated oils.  Limit alcohol intake to no more than 1 drink a day for nonpregnant women and 2 drinks a day for men. One drink equals 12 oz of beer, 5 oz of wine, or 1 oz of hard liquor. Reading food labels  Check food labels for: ? Trans fats. ? Partially hydrogenated oils. ? Saturated fat (g) in each serving. ? Cholesterol (mg) in each serving. ? Fiber (g) in each serving.  Choose foods with healthy fats, such as: ? Monounsaturated fats. ? Polyunsaturated fats. ? Omega-3 fats.  Choose grain products that have whole grains. Look for the word "whole" as the first word in the ingredient list. Cooking  Cook foods using low-fat methods. These include baking, boiling, grilling, and broiling.  Eat more home-cooked foods. Eat at restaurants and buffets less often.  Avoid cooking using saturated fats, such as butter, cream, palm oil, palm kernel oil, and coconut oil. Recommended foods  Fruits  All fresh, canned (in natural juice), or frozen fruits. Vegetables  Fresh or frozen  vegetables (raw, steamed, roasted, or grilled). Green salads. Grains  Whole grains, such as whole wheat or whole grain breads, crackers, cereals, and pasta. Unsweetened oatmeal, bulgur, barley, quinoa, or brown rice. Corn or whole wheat flour tortillas. Meats and other protein foods  Ground beef (85% or leaner), grass-fed beef, or beef trimmed of fat. Skinless chicken or [place]. Ground chicken or [place]. Pork trimmed of fat. All fish and seafood. Egg whites. Dried beans, peas, or lentils. Unsalted nuts or seeds. Unsalted canned beans. Nut butters without added sugar or oil. Dairy  Low-fat or nonfat dairy products, such as skim or 1% milk, 2% or reduced-fat cheeses, low-fat and fat-free ricotta or cottage cheese, or plain low-fat and nonfat yogurt. Fats and oils  Tub margarine without trans fats. Light or reduced-fat mayonnaise and salad dressings. Avocado. Olive, canola, sesame, or safflower oils. The items listed above may not be a complete list of foods and  beverages you can eat. Contact a dietitian for more information. Foods to avoid Fruits  Canned fruit in heavy syrup. Fruit in cream or butter sauce. Fried fruit. Vegetables  Vegetables cooked in cheese, cream, or butter sauce. Fried vegetables. Grains  White bread. White pasta. White rice. Cornbread. Bagels, pastries, and croissants. Crackers and snack foods that contain trans fat and hydrogenated oils. Meats and other protein foods  Fatty cuts of meat. Ribs, chicken wings, bacon, sausage, bologna, salami, chitterlings, fatback, hot dogs, bratwurst, and packaged lunch meats. Liver and organ meats. Whole eggs and egg yolks. Chicken and [place] with skin. Fried meat. Dairy  Whole or 2% milk, cream, half-and-half, and cream cheese. Whole milk cheeses. Whole-fat or sweetened yogurt. Full-fat cheeses. Nondairy creamers and whipped toppings. Processed cheese, cheese spreads, and cheese curds. Beverages  Alcohol. Sugar-sweetened drinks  such as sodas, lemonade, and fruit drinks. Fats and oils  Butter, stick margarine, lard, shortening, ghee, or bacon fat. Coconut, palm kernel, and palm oils. Sweets and desserts  Corn syrup, sugars, honey, and molasses. Candy. Jam and jelly. Syrup. Sweetened cereals. Cookies, pies, cakes, donuts, muffins, and ice cream. The items listed above may not be a complete list of foods and beverages you should avoid. Contact a dietitian for more information. Summary  Choosing the right foods helps keep your fat and cholesterol at normal levels. This can keep you from getting certain diseases.  At meals, fill one-half of your plate with vegetables and green salads.  Eat high-fiber foods, like whole grains, beans, apples, carrots, peas, and barley.  Limit added sugar, saturated fats, alcohol, and fried foods. This information is not intended to replace advice given to you by your health care provider. Make sure you discuss any questions you have with your health care provider. Document Released: 05/08/2012 Document Revised: 07/11/2018 Document Reviewed: 07/25/2017 Elsevier Patient Education  2020 [place] for [employer] Calories are units of energy. Your body needs a certain amount of calories from food to keep you going throughout the day. When you eat more calories than your body needs, your body stores the extra calories as fat. When you eat fewer calories than your body needs, your body burns fat to get the energy it needs. Calorie counting means keeping track of how many calories you eat and drink each day. Calorie counting can be helpful if you need to lose weight. If you make sure to eat fewer calories than your body needs, you should lose weight. Ask your health care provider what a healthy weight is for you. For calorie counting to work, you will need to eat the right number of calories in a day in order to lose a healthy amount of weight per week. A dietitian can help  you determine how many calories you need in a day and will give you suggestions on how to reach your calorie goal.  A healthy amount of weight to lose per week is usually 1-2 lb (0.5-0.9 kg). This usually means that your daily calorie intake should be reduced by 500-750 calories.  Eating 1,200 - 1,500 calories per day can help most women lose weight.  Eating 1,500 - 1,800 calories per day can help most men lose weight. What is my plan? My goal is to have __________ calories per day. If I have this many calories per day, I should lose around __________ pounds per week. What do I need to know about calorie counting? In order to meet your daily calorie goal,  you will need to:  Find out how many calories are in each food you would like to eat. Try to do this before you eat.  Decide how much of the food you plan to eat.  Write down what you ate and how many calories it had. Doing this is called keeping a food log. To successfully lose weight, it is important to balance calorie counting with a healthy lifestyle that includes regular activity. Aim for 150 minutes of moderate exercise (such as walking) or 75 minutes of vigorous exercise (such as running) each week. Where do I find calorie information?  The number of calories in a food can be found on a Nutrition Facts label. If a food does not have a Nutrition Facts label, try to look up the calories online or ask your dietitian for help. Remember that calories are listed per serving. If you choose to have more than one serving of a food, you will have to multiply the calories per serving by the amount of servings you plan to eat. For example, the label on a package of bread might say that a serving size is 1 slice and that there are 90 calories in a serving. If you eat 1 slice, you will have eaten 90 calories. If you eat 2 slices, you will have eaten 180 calories. How do I keep a food log? Immediately after each meal, record the following  information in your food log:  What you ate. Don't forget to include toppings, sauces, and other extras on the food.  How much you ate. This can be measured in cups, ounces, or number of items.  How many calories each food and drink had.  The total number of calories in the meal. Keep your food log near you, such as in a small notebook in your pocket, or use a mobile app or website. Some programs will calculate calories for you and show you how many calories you have left for the day to meet your goal. What are some calorie counting tips?   Use your calories on foods and drinks that will fill you up and not leave you hungry: ? Some examples of foods that fill you up are nuts and nut butters, vegetables, lean proteins, and high-fiber foods like whole grains. High-fiber foods are foods with more than 5 g fiber per serving. ? Drinks such as sodas, specialty coffee drinks, alcohol, and juices have a lot of calories, yet do not fill you up.  Eat nutritious foods and avoid empty calories. Empty calories are calories you get from foods or beverages that do not have many vitamins or protein, such as candy, sweets, and soda. It is better to have a nutritious high-calorie food (such as an avocado) than a food with few nutrients (such as a bag of chips).  Know how many calories are in the foods you eat most often. This will help you calculate calorie counts faster.  Pay attention to calories in drinks. Low-calorie drinks include water and unsweetened drinks.  Pay attention to nutrition labels for "low fat" or "fat free" foods. These foods sometimes have the same amount of calories or more calories than the full fat versions. They also often have added sugar, starch, or salt, to make up for flavor that was removed with the fat.  Find a way of tracking calories that works for you. Get creative. Try different apps or programs if writing down calories does not work for you. What are some portion control  tips?  Know how many calories are in a serving. This will help you know how many servings of a certain food you can have.  Use a measuring cup to measure serving sizes. You could also try weighing out portions on a kitchen scale. With time, you will be able to estimate serving sizes for some foods.  Take some time to put servings of different foods on your favorite plates, bowls, and cups so you know what a serving looks like.  Try not to eat straight from a bag or box. Doing this can lead to overeating. Put the amount you would like to eat in a cup or on a plate to make sure you are eating the right portion.  Use smaller plates, glasses, and bowls to prevent overeating.  Try not to multitask (for example, watch TV or use your computer) while eating. If it is time to eat, sit down at a table and enjoy your food. This will help you to know when you are full. It will also help you to be aware of what you are eating and how much you are eating. What are tips for following this plan? Reading food labels  Check the calorie count compared to the serving size. The serving size may be smaller than what you are used to eating.  Check the source of the calories. Make sure the food you are eating is high in vitamins and protein and low in saturated and trans fats. Shopping  Read nutrition labels while you shop. This will help you make healthy decisions before you decide to purchase your food.  Make a grocery list and stick to it. Cooking  Try to cook your favorite foods in a healthier way. For example, try baking instead of frying.  Use low-fat dairy products. Meal planning  Use more fruits and vegetables. Half of your plate should be fruits and vegetables.  Include lean proteins like poultry and fish. How do I count calories when eating out?  Ask for smaller portion sizes.  Consider sharing an entree and sides instead of getting your own entree.  If you get your own entree, eat only  half. Ask for a box at the beginning of your meal and put the rest of your entree in it so you are not tempted to eat it.  If calories are listed on the menu, choose the lower calorie options.  Choose dishes that include vegetables, fruits, whole grains, low-fat dairy products, and lean protein.  Choose items that are boiled, broiled, grilled, or steamed. Stay away from items that are buttered, battered, fried, or served with cream sauce. Items labeled "crispy" are usually fried, unless stated otherwise.  Choose water, low-fat milk, unsweetened iced tea, or other drinks without added sugar. If you want an alcoholic beverage, choose a lower calorie option such as a glass of wine or light beer.  Ask for dressings, sauces, and syrups on the side. These are usually high in calories, so you should limit the amount you eat.  If you want a salad, choose a garden salad and ask for grilled meats. Avoid extra toppings like bacon, cheese, or fried items. Ask for the dressing on the side, or ask for olive oil and vinegar or lemon to use as dressing.  Estimate how many servings of a food you are given. For example, a serving of cooked rice is  cup or about the size of half a baseball. Knowing serving sizes will help you be aware of how  much food you are eating at restaurants. The list below tells you how big or small some common portion sizes are based on everyday objects: ? 1 oz--4 stacked dice. ? 3 oz--1 deck of cards. ? 1 tsp--1 die. ? 1 Tbsp-- a ping-pong ball. ? 2 Tbsp--1 ping-pong ball. ?  cup-- baseball. ? 1 cup--1 baseball. Summary  Calorie counting means keeping track of how many calories you eat and drink each day. If you eat fewer calories than your body needs, you should lose weight.  A healthy amount of weight to lose per week is usually 1-2 lb (0.5-0.9 kg). This usually means reducing your daily calorie intake by 500-750 calories.  The number of calories in a food can be found on a  Nutrition Facts label. If a food does not have a Nutrition Facts label, try to look up the calories online or ask your dietitian for help.  Use your calories on foods and drinks that will fill you up, and not on foods and drinks that will leave you hungry.  Use smaller plates, glasses, and bowls to prevent overeating. This information is not intended to replace advice given to you by your health care provider. Make sure you discuss any questions you have with your health care provider. Document Released: 11/07/2005 Document Revised: 07/27/2018 Document Reviewed: 10/07/2016 Elsevier Patient Education  [place].  Low-Sodium Eating Plan Sodium, which is an element that makes up salt, helps you maintain a healthy balance of fluids in your body. Too much sodium can increase your blood pressure and cause fluid and waste to be held in your body. Your health care provider or dietitian may recommend following this plan if you have high blood pressure (hypertension), kidney disease, liver disease, or heart failure. Eating less sodium can help lower your blood pressure, reduce swelling, and protect your heart, liver, and kidneys. What are tips for following this plan? General guidelines  Most people on this plan should limit their sodium intake to 1,500-2,000 mg (milligrams) of sodium each day. Reading food labels   The Nutrition Facts label lists the amount of sodium in one serving of the food. If you eat more than one serving, you must multiply the listed amount of sodium by the number of servings.  Choose foods with less than 140 mg of sodium per serving.  Avoid foods with 300 mg of sodium or more per serving. Shopping  Look for lower-sodium products, often labeled as "low-sodium" or "no salt added."  Always check the sodium content even if foods are labeled as "unsalted" or "no salt added".  Buy fresh foods. ? Avoid canned foods and premade or frozen meals. ? Avoid canned, cured,  or processed meats  Buy breads that have less than 80 mg of sodium per slice. Cooking  Eat more home-cooked food and less restaurant, buffet, and fast food.  Avoid adding salt when cooking. Use salt-free seasonings or herbs instead of table salt or sea salt. Check with your health care provider or pharmacist before using salt substitutes.  Cook with plant-based oils, such as canola, sunflower, or olive oil. Meal planning  When eating at a restaurant, ask that your food be prepared with less salt or no salt, if possible.  Avoid foods that contain MSG (monosodium glutamate). MSG is sometimes added to [place] food, bouillon, and some canned foods. What foods are recommended? The items listed may not be a complete list. Talk with your dietitian about what dietary choices are best for you.  Grains Low-sodium cereals, including oats, puffed wheat and rice, and shredded wheat. Low-sodium crackers. Unsalted rice. Unsalted pasta. Low-sodium bread. Whole-grain breads and whole-grain pasta. Vegetables Fresh or frozen vegetables. "No salt added" canned vegetables. "No salt added" tomato sauce and paste. Low-sodium or reduced-sodium tomato and vegetable juice. Fruits Fresh, frozen, or canned fruit. Fruit juice. Meats and other protein foods Fresh or frozen (no salt added) meat, poultry, seafood, and fish. Low-sodium canned tuna and salmon. Unsalted nuts. Dried peas, beans, and lentils without added salt. Unsalted canned beans. Eggs. Unsalted nut butters. Dairy Milk. Soy milk. Cheese that is naturally low in sodium, such as ricotta cheese, fresh mozzarella, or Swiss cheese Low-sodium or reduced-sodium cheese. Cream cheese. Yogurt. Fats and oils Unsalted butter. Unsalted margarine with no trans fat. Vegetable oils such as canola or olive oils. Seasonings and other foods Fresh and dried herbs and spices. Salt-free seasonings. Low-sodium mustard and ketchup. Sodium-free salad dressing. Sodium-free light  mayonnaise. Fresh or refrigerated horseradish. Lemon juice. Vinegar. Homemade, reduced-sodium, or low-sodium soups. Unsalted popcorn and pretzels. Low-salt or salt-free chips. What foods are not recommended? The items listed may not be a complete list. Talk with your dietitian about what dietary choices are best for you. Grains Instant hot cereals. Bread stuffing, pancake, and biscuit mixes. Croutons. Seasoned rice or pasta mixes. Noodle soup cups. Boxed or frozen macaroni and cheese. Regular salted crackers. Self-rising flour. Vegetables Sauerkraut, pickled vegetables, and relishes. Olives. [place] fries. Onion rings. Regular canned vegetables (not low-sodium or reduced-sodium). Regular canned tomato sauce and paste (not low-sodium or reduced-sodium). Regular tomato and vegetable juice (not low-sodium or reduced-sodium). Frozen vegetables in sauces. Meats and other protein foods Meat or fish that is salted, canned, smoked, spiced, or pickled. Bacon, ham, sausage, hotdogs, corned beef, chipped beef, packaged lunch meats, salt pork, jerky, pickled herring, anchovies, regular canned tuna, sardines, salted nuts. Dairy Processed cheese and cheese spreads. Cheese curds. Blue cheese. Feta cheese. String cheese. Regular cottage cheese. Buttermilk. Canned milk. Fats and oils Salted butter. Regular margarine. Ghee. Bacon fat. Seasonings and other foods Onion salt, garlic salt, seasoned salt, table salt, and sea salt. Canned and packaged gravies. Worcestershire sauce. Tartar sauce. Barbecue sauce. Teriyaki sauce. Soy sauce, including reduced-sodium. Steak sauce. Fish sauce. Oyster sauce. Cocktail sauce. Horseradish that you find on the shelf. Regular ketchup and mustard. Meat flavorings and tenderizers. Bouillon cubes. Hot sauce and Tabasco sauce. Premade or packaged marinades. Premade or packaged taco seasonings. Relishes. Regular salad dressings. Salsa. Potato and tortilla chips. Corn chips and puffs. Salted  popcorn and pretzels. Canned or dried soups. Pizza. Frozen entrees and pot pies. Summary  Eating less sodium can help lower your blood pressure, reduce swelling, and protect your heart, liver, and kidneys.  Most people on this plan should limit their sodium intake to 1,500-2,000 mg (milligrams) of sodium each day.  Canned, boxed, and frozen foods are high in sodium. Restaurant foods, fast foods, and pizza are also very high in sodium. You also get sodium by adding salt to food.  Try to cook at home, eat more fresh fruits and vegetables, and eat less fast food, canned, processed, or prepared foods. This information is not intended to replace advice given to you by your health care provider. Make sure you discuss any questions you have with your health care provider. Document Released: 04/29/2002 Document Revised: 10/20/2017 Document Reviewed: 10/31/2016 Elsevier Patient Education  2020 [employer].

## 2019-11-08 NOTE — Progress Notes (Signed)
Cardiology Office Note   Date:  11/08/2019   ID:  EVETT Villarreal, DOB [birth year], MRN [id number]  PCP:  Derinda Late, MD  Cardiologist:   [name], MD   Chief Complaint  Patient presents with  . Other    4 month follow up. Patient c.o chest tightness and lout of breath very easy. Meds reviewed verbally with patient.       History of Present Illness: Kim Villarreal is a 48 y.o. female who presents for a follow-up visit regarding coronary artery disease.  She is a previous smoker . She has no diabetes or hyperlipidemia.  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 She did have a lot of postoperative pain especially in her neck that subsequently improved.    She had recurrent angina in June in the setting of significantly elevated blood pressure with exercise.  She underwent cardiac catheterization on June 29 which showed occluded proximal LAD with patent LIMA to LAD with moderate anastomosis lesion estimated to be 40%.  EF was normal with mildly elevated left ventricular end-diastolic pressure a 3-day ZIO patch monitor which showed normal sinus rhythm with an average heart rate of 66 bpm.  Frequent PACs and rare PVCs.    Given baseline bradycardia, she was not started on a beta-blocker.  Blood pressure continues to be elevated in spite of maximal dose losartan and thus amlodipine 5 mg once daily was added.  Since then, she had significant improvement in blood pressure. She resumed exercising and has been getting her heart rate up to 160 bpm.  She does not experience any chest pain with exercise.  However, she did have few episodes of chest tightness that is completely not related to exercise. She also has been dealing with menstrual irregularities and had a continuous period for the last 5 weeks.  She does not know if she is anemic or not.  Past Medical History:  Diagnosis Date  . Anemia    "as a child"  .  Anxiety   . Bursitis   . Childhood asthma   . Chronic lower back pain   . Coronary artery disease    a. 09/2016: cath showing 95% stenosis of the mid-LAD. Xience 2.5x23mm DES placed, remaining left and right coronary tree angiographically normal, lip swelling noted at the end of the case without rash or respiratory distress, treated with IV Solu-Medrol and Benadryl.  Marland Kitchen History of nuclear stress test    a. 07/2016: no sig ischemia, nl wall motion, EF 72%, small defect along mid to apical anteroseptal wall c/w breast attenuation artifact, low risk study  . Hypertension   . Migraine    "it was a bout; beginning of 2017; for ~ 1 month; went away" (05/23/2018)  . Mitral regurgitation    a. echo 08/2016: vigorous EF 65-70%, nl wall motion, nl LV diastolic fxn, mild MR  . Tendonitis     Past Surgical History:  Procedure Laterality Date  . CARDIAC CATHETERIZATION N/A 10/05/2016   Procedure: Left Heart Cath and Coronary Angiography;  Surgeon: Wellington Hampshire, MD;  Location: [place] [facility];  Service: Cardiovascular;  Laterality: N/A;  . CARDIAC CATHETERIZATION N/A 10/05/2016   Procedure: Coronary Stent Intervention;  Surgeon: Wellington Hampshire, MD;  Location: [place] [facility];  Service: Cardiovascular;  Laterality: N/A;  . CESAREAN SECTION  2003; 2005  . CORONARY ANGIOPLASTY WITH STENT PLACEMENT  05/23/2018   "2 stents"  .  CORONARY ARTERY BYPASS GRAFT N/A 08/27/2018   Procedure: CORONARY ARTERY BYPASS GRAFTING (CABG) x 1 using LEFT INTERNAL MAMMARY ARTERY;  Surgeon: Gaye Pollack, MD;  Location: [place] [facility];  Service: Open Heart Surgery;  Laterality: N/A;  . CORONARY STENT INTERVENTION N/A 05/23/2018   Procedure: CORONARY STENT INTERVENTION;  Surgeon: Wellington Hampshire, MD;  Location: [place] [facility];  Service: Cardiovascular;  Laterality: N/A;  mid LAD  . ECTOPIC PREGNANCY SURGERY  1991  . LEFT HEART CATH AND CORONARY ANGIOGRAPHY N/A 05/23/2018   Procedure: LEFT HEART CATH AND CORONARY  ANGIOGRAPHY;  Surgeon: Wellington Hampshire, MD;  Location: [place] [facility];  Service: Cardiovascular;  Laterality: N/A;  . LEFT HEART CATH AND CORONARY ANGIOGRAPHY N/A 08/22/2018   Procedure: LEFT HEART CATH AND CORONARY ANGIOGRAPHY;  Surgeon: Wellington Hampshire, MD;  Location: [place];  Service: Cardiovascular;  Laterality: N/A;  . LEFT HEART CATH AND CORS/GRAFTS ANGIOGRAPHY Left 05/20/2019   Procedure: LEFT HEART CATH AND CORS/GRAFTS ANGIOGRAPHY;  Surgeon: Wellington Hampshire, MD;  Location: [place] [facility];  Service: Cardiovascular;  Laterality: Left;  . TEE WITHOUT CARDIOVERSION N/A 08/27/2018   Procedure: TRANSESOPHAGEAL ECHOCARDIOGRAM (TEE);  Surgeon: Gaye Pollack, MD;  Location: [place];  Service: Open Heart Surgery;  Laterality: N/A;     Current Outpatient Medications  Medication Sig Dispense Refill  . aspirin 325 MG EC tablet Take 1 tablet (325 mg total) by mouth daily.    Marland Kitchen losartan (COZAAR) 100 MG tablet Take 1 tablet (100 mg total) by mouth daily. 90 tablet 3  . nitrofurantoin (MACRODANTIN) 25 MG capsule Take 25 mg by mouth daily as needed (for UTI prevention).     . nitroGLYCERIN (NITROSTAT) 0.4 MG SL tablet Place 1 tablet (0.4 mg total) under the tongue every 5 (five) minutes as needed for chest pain (Do not tak more than 3 tablets in any 24 hour period). 30 tablet 0  . rosuvastatin (CRESTOR) 5 MG tablet TAKE 1 TABLET(5 MG) BY MOUTH DAILY (Patient taking differently: Take 5 mg by mouth daily. ) 90 tablet 2  . venlafaxine (EFFEXOR) 75 MG tablet     . amLODipine (NORVASC) 5 MG tablet Take 1 tablet (5 mg total) by mouth daily. 30 tablet 5   No current facility-administered medications for this visit.    Allergies:   Codeine and Contrast media [iodinated diagnostic agents]    Social History:  The patient  reports that she quit smoking about 13 years ago. Her smoking use included cigarettes. She has a 11.25 pack-year smoking history. She has never used smokeless  tobacco. She reports current alcohol use. She reports that she does not use drugs.   Family History:  The patient's family history includes Heart disease in her mother; Parkinson's disease in her father.    ROS:  Please see the history of present illness.   Otherwise, review of systems are positive for none.   All other systems are reviewed and negative.    PHYSICAL EXAM: VS:  BP (!) 142/62 (BP Location: Left Arm, Patient Position: Sitting, Cuff Size: Normal)   Pulse 64   Ht 5' 6.5" (1.689 m)   Wt 150 lb 8 oz (68.3 kg)   SpO2 98%   BMI 23.93 kg/m  , BMI Body mass index is 23.93 kg/m. GEN: Well nourished, well developed, in no acute distress  HEENT: normal  Neck: no JVD, carotid bruits, or masses Cardiac: RRR; no  rubs, or gallops,no edema .  1/6 systolic ejection murmur in the aortic area. Respiratory:  clear to auscultation bilaterally, normal work of breathing GI: soft, nontender, nondistended, + BS MS: no deformity or atrophy  Skin: warm and dry, no rash Neuro:  Strength and sensation are intact Psych: euthymic mood, full affect Femoral pulses diminished bilaterally.  EKG:  EKG is  ordered today. EKG showed sinus rhythm with occasional PVC and old anterior MI.  This is not different from most recent EKG.  Recent Labs: 05/16/2019: BUN 17; Creatinine, Ser 0.83; Hemoglobin 14.4; Platelets 143; Potassium 4.0; Sodium 140    Lipid Panel    Component Value Date/Time   CHOL 137 09/17/2018 1051   TRIG 145 09/17/2018 1051   HDL 47 09/17/2018 1051   CHOLHDL 2.9 09/17/2018 1051   CHOLHDL 2.8 03/09/2017 0855   VLDL 11 03/09/2017 0855   LDLCALC 61 09/17/2018 1051   LDLDIRECT 61 09/17/2018 1051      Wt Readings from Last 3 Encounters:  11/08/19 150 lb 8 oz (68.3 kg)  07/09/19 148 lb (67.1 kg)  05/23/19 149 lb 4 oz (67.7 kg)      PAD Screen 08/05/2016  Previous PAD dx? No  Previous surgical procedure? No  Pain with walking? No  Feet/toe relief with dangling? No  Painful,  non-healing ulcers? No  Extremities discolored? No      ASSESSMENT AND PLAN:  1.  Coronary artery disease involving native coronary arteries without angina: She is status post one-vessel CABG with LIMA to LAD in October 2019.  No exertional chest pain.  Current chest pain at rest seems to be atypical and noncardiac. I recommended that she tries to keep her heart rate less than 155 bpm with exercise.  2. Hyperlipidemia: She is tolerating small dose rosuvastatin.  Most recent lipid profile showed an LDL of 61.  I requested lipid and liver profile today.  3. Essential hypertension: Blood pressure is reasonably controlled on losartan and amlodipine.  4.  Fatigue: She reports continuous menstrual period over the last 5 weeks.  I am going to check CBC.  I recommended that she sees an OB/GYN for evaluation of this.    Disposition:   FU with me in 6 months.  Signed,  [name], MD  11/08/2019 2:47 PM    [place]

## 2019-11-09 LAB — COMPREHENSIVE METABOLIC PANEL
ALT: 19 IU/L (ref 0–32)
AST: 20 IU/L (ref 0–40)
Albumin/Globulin Ratio: 2.4 — ABNORMAL HIGH (ref 1.2–2.2)
Albumin: 4.7 g/dL (ref 3.8–4.8)
Alkaline Phosphatase: 72 IU/L (ref 39–117)
BUN/Creatinine Ratio: 18 (ref 9–23)
BUN: 17 mg/dL (ref 6–24)
Bilirubin Total: 0.3 mg/dL (ref 0.0–1.2)
CO2: 22 mmol/L (ref 20–29)
Calcium: 9.6 mg/dL (ref 8.7–10.2)
Chloride: 103 mmol/L (ref 96–106)
Creatinine, Ser: 0.92 mg/dL (ref 0.57–1.00)
GFR calc Af Amer: 86 mL/min/{1.73_m2} (ref >59)
GFR calc non Af Amer: 74 mL/min/{1.73_m2} (ref >59)
Globulin, Total: 2 g/dL (ref 1.5–4.5)
Glucose: 88 mg/dL (ref 65–99)
Potassium: 4.4 mmol/L (ref 3.5–5.2)
Sodium: 139 mmol/L (ref 134–144)
Total Protein: 6.7 g/dL (ref 6.0–8.5)

## 2019-11-09 LAB — CBC
Hematocrit: 35.4 % (ref 34.0–46.6)
Hemoglobin: 11.5 g/dL (ref 11.1–15.9)
MCH: 27.8 pg (ref 26.6–33.0)
MCHC: 32.5 g/dL (ref 31.5–35.7)
MCV: 86 fL (ref 79–97)
Platelets: 189 10*3/uL (ref 150–450)
RBC: 4.13 x10E6/uL (ref 3.77–5.28)
RDW: 13.3 % (ref 11.7–15.4)
WBC: 4.4 10*3/uL (ref 3.4–10.8)

## 2019-11-09 LAB — LIPID PANEL
Chol/HDL Ratio: 2.5 ratio (ref 0.0–4.4)
Cholesterol, Total: 138 mg/dL (ref 100–199)
HDL: 56 mg/dL (ref >39)
LDL Chol Calc (NIH): 64 mg/dL (ref 0–99)
Triglycerides: 98 mg/dL (ref 0–149)
VLDL Cholesterol Cal: 18 mg/dL (ref 5–40)

## 2020-01-15 ENCOUNTER — Other Ambulatory Visit: Payer: Self-pay | Admitting: Cardiovascular Disease

## 2020-01-16 ENCOUNTER — Other Ambulatory Visit: Payer: Self-pay | Admitting: Cardiovascular Disease

## 2020-01-20 ENCOUNTER — Other Ambulatory Visit

## 2020-03-08 NOTE — Progress Notes (Signed)
New Patient Note  RE: Kim Villarreal MRN: [id number] DOB: Aug 19, 1971 Date of Office Visit: 03/09/2020  Referring provider: Derinda Late, MD Primary care provider: Derinda Late, MD  Chief Complaint: Urticaria and Facial Swelling  History of Present Illness: I had the pleasure of seeing Kim Villarreal for initial evaluation at the [facility] on 03/09/2020. She is a 49 y.o. female, who is self-referred here for the evaluation of rash/facial swelling.   Rash: Rash started in February 2021. It initially started on her trunk and was told she is having hives. They progressively spread to her torso and extremities. She also started to have lip swelling, cheek swelling, facial swelling. Denies any respiratory compromise.   Describes them as itchy, erythematous. Individual rashes lasts about 1 day. No ecchymosis upon resolution. Associated symptoms include: facial swelling. Suspected triggers are unknown. Denies any fevers, chills, changes in medications, foods, personal care products or recent infections. She has tried the following therapies: zyrtec 10mg  daily and benadryl prn with some benefit. Systemic steroids yes for 10 days during which the hives completely resolved. Hives/swelling flared since off antihistamines. No previous COVID-19 diagnosis.  Previous work up includes: 02/07/2020 - CBC, CMP. Previous history of rash/hives: denies. Patient is up to date with the following cancer screening tests: physical exam. Not up to date with mammogram and pap smears.  Patient was in [place] last week and had no hives/swelling.   Assessment and Plan: Kim Villarreal is a 49 y.o. female with: Chronic urticaria Rash started in February 2021 which is now persistent and spreading with facial angioedema. Denies any associated respiratory compromise. Benadryl and zyrtec somewhat helpful. 1 course of prednisone which cleared up the hives. Denies any changes in diet, meds, personal care  products or recent infections. March 2021 CBC diff and CMP normal.  . Based on clinical history, she likely has chronic idiopathic urticaria. Discussed with patient, that urticaria is usually caused by release of histamine by cutaneous mast cells but sometimes it is non-histamine mediated. Explained that urticaria is not always associated with allergies, and may be related to other infectious or autoimmune causes. In most cases, the exact etiology for urticaria can not be established and it is considered idiopathic. Marland Kitchen No indication for any environmental/food testing today.  . Discussed the role of adding omalizumab  (anti-IgE antibodies) in controlling urticaria in refractory patients. Informational pamphlet given. . Meanwhile start the following medications:   Start cetirizine 10mg  twice a day.  May increase to 10mg  2 tablets twice a day if hives not controlled.   Start famotidine (Pepcid) 20mg  twice a day. . Avoid the following potential triggers: alcohol, tight clothing, NSAIDs.   If swelling does not subside within the next 2 days or gets worse let us know and may need a short course of oral prednisone at that time.  . Get bloodwork to rule out other etiologies.  . Avoid the following potential triggers: alcohol, tight clothing, NSAIDs.  . For mild symptoms you can take over the counter antihistamines such as Benadryl and monitor symptoms closely. If symptoms worsen or if you have severe symptoms including breathing issues, throat closure, significant swelling, whole body hives, severe diarrhea and vomiting, lightheadedness then seek immediate medical care.  Angio-edema  See assessment and plan as above for urticaria.   Post-nasal drip Some PND which the zyrtec seems to help with. Skin testing as a child was positive to mold and cat per patient report - done in [place].  The above antihistamine regimen may help with this symptom. Monitor.   Get environmental allergy panel via  bloodwork instead of skin prick testing due to her current hive outbreak.   Return in about 4 weeks (around 04/06/2020).  Meds ordered this encounter  Medications  . famotidine (PEPCID) 20 MG tablet    Sig: Take 1 tablet (20 mg total) by mouth 2 (two) times daily.    Dispense:  60 tablet    Refill:  2  . cetirizine (ZYRTEC ALLERGY) 10 MG tablet    Sig: Take up to 2 tablets twice a day for hives.    Dispense:  120 tablet    Refill:  2    Lab Orders     Chronic Urticaria     Allergens w/Total IgE Area 2     Alpha-Gal Panel     ANA w/Reflex     Tryptase     C3 and C4     C1 esterase inhibitor, functional     C1 Esterase Inhibitor     C-reactive protein     Sedimentation rate     Thyroid Cascade Profile     Protein Electrophoresis, Urine Rflx.     Protein electrophoresis, serum  Other allergy screening: Asthma: no Rhino conjunctivitis: yes Watery eyes and PND at times. Skin testing as a child which was positive to mold and cat per patient report.  Food allergy: no Medication allergy: yes  Codeine - Nausea and vomiting Contrast dye - swelling?  Hymenoptera allergy: no Eczema:no History of recurrent infections suggestive of immunodeficency: no  Diagnostics: None.  Past Medical History: Patient Active Problem List   Diagnosis Date Noted  . Chronic urticaria 03/09/2020  . Post-nasal drip 03/09/2020  . Angio-edema 03/09/2020  . Unstable angina ([place]) 05/20/2019  . 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 Coronary stent restenosis 08/24/2018  . Essential hypertension 10/06/2016  . CAD (coronary artery disease), native coronary artery 10/06/2016  . Status post coronary artery stent placement    Past Medical History:  Diagnosis Date  . Anemia    "as a child"  . Anxiety   . Bursitis   . Childhood asthma   . Chronic lower back pain   . Coronary artery disease    a. 09/2016: cath showing 95% stenosis of the mid-LAD. Xience 2.5x52mm DES placed, remaining left and right coronary  tree angiographically normal, lip swelling noted at the end of the case without rash or respiratory distress, treated with IV Solu-Medrol and Benadryl.  Marland Kitchen History of nuclear stress test    a. 07/2016: no sig ischemia, nl wall motion, EF 72%, small defect along mid to apical anteroseptal wall c/w breast attenuation artifact, low risk study  . Hypertension   . Migraine    "it was a bout; beginning of 2017; for ~ 1 month; went away" (05/23/2018)  . Mitral regurgitation    a. echo 08/2016: vigorous EF 65-70%, nl wall motion, nl LV diastolic fxn, mild MR  . Tendonitis   . Urticaria    Past Surgical History: Past Surgical History:  Procedure Laterality Date  . CARDIAC CATHETERIZATION N/A 10/05/2016   Procedure: Left Heart Cath and Coronary Angiography;  Surgeon: Wellington Hampshire, MD;  Location: [facility];  Service: Cardiovascular;  Laterality: N/A;  . CARDIAC CATHETERIZATION N/A 10/05/2016   Procedure: Coronary Stent Intervention;  Surgeon: Wellington Hampshire, MD;  Location: [facility];  Service: Cardiovascular;  Laterality: N/A;  . CESAREAN SECTION  2003;  2005  . CORONARY ANGIOPLASTY WITH STENT PLACEMENT  05/23/2018   "2 stents"  . CORONARY ARTERY BYPASS GRAFT N/A 08/27/2018   Procedure: CORONARY ARTERY BYPASS GRAFTING (CABG) x 1 using LEFT INTERNAL MAMMARY ARTERY;  Surgeon: Gaye Pollack, MD;  Location: [facility];  Service: Open Heart Surgery;  Laterality: N/A;  . CORONARY STENT INTERVENTION N/A 05/23/2018   Procedure: CORONARY STENT INTERVENTION;  Surgeon: Wellington Hampshire, MD;  Location: [facility];  Service: Cardiovascular;  Laterality: N/A;  mid LAD  . ECTOPIC PREGNANCY SURGERY  1991  . LEFT HEART CATH AND CORONARY ANGIOGRAPHY N/A 05/23/2018   Procedure: LEFT HEART CATH AND CORONARY ANGIOGRAPHY;  Surgeon: Wellington Hampshire, MD;  Location: [facility];  Service: Cardiovascular;  Laterality: N/A;  . LEFT HEART CATH AND CORONARY ANGIOGRAPHY N/A 08/22/2018   Procedure:  LEFT HEART CATH AND CORONARY ANGIOGRAPHY;  Surgeon: Wellington Hampshire, MD;  Location: [facility];  Service: Cardiovascular;  Laterality: N/A;  . LEFT HEART CATH AND CORS/GRAFTS ANGIOGRAPHY Left 05/20/2019   Procedure: LEFT HEART CATH AND CORS/GRAFTS ANGIOGRAPHY;  Surgeon: Wellington Hampshire, MD;  Location: [facility];  Service: Cardiovascular;  Laterality: Left;  . TEE WITHOUT CARDIOVERSION N/A 08/27/2018   Procedure: TRANSESOPHAGEAL ECHOCARDIOGRAM (TEE);  Surgeon: Gaye Pollack, MD;  Location: [facility];  Service: Open Heart Surgery;  Laterality: N/A;   Medication List:  Current Outpatient Medications  Medication Sig Dispense Refill  . amLODipine (NORVASC) 5 MG tablet TAKE 1 TABLET(5 MG) BY MOUTH DAILY 30 tablet 5  . aspirin 325 MG EC tablet Take 1 tablet (325 mg total) by mouth daily.    . diphenhydrAMINE (BENADRYL) 25 MG tablet Take 25 mg by mouth daily as needed.    Marland Kitchen losartan (COZAAR) 100 MG tablet Take 1 tablet (100 mg total) by mouth daily. 90 tablet 3  . Multiple Vitamins-Minerals (MULTIVITAMIN GUMMIES ADULT PO) Take 1 Units by mouth daily.    . Omega-3 Fatty Acids (FISH OIL) 1000 MG CAPS Take by mouth. Patient taking liquid fish oil. 1 tsp twice daily    . rosuvastatin (CRESTOR) 5 MG tablet TAKE 1 TABLET DAILY 90 tablet 0  . Saccharomyces boulardii (PROBIOTIC) 250 MG CAPS Take 1 capsule by mouth daily.    Marland Kitchen venlafaxine (EFFEXOR) 75 MG tablet     . cetirizine (ZYRTEC ALLERGY) 10 MG tablet Take up to 2 tablets twice a day for hives. 120 tablet 2  . Cholecalciferol 25 MCG (1000 UT) capsule Take 1 capsule by mouth daily.    . famotidine (PEPCID) 20 MG tablet Take 1 tablet (20 mg total) by mouth 2 (two) times daily. 60 tablet 2  . nitrofurantoin (MACRODANTIN) 25 MG capsule Take 25 mg by mouth daily as needed (for UTI prevention).     . nitroGLYCERIN (NITROSTAT) 0.4 MG SL tablet Place 1 tablet (0.4 mg total) under the tongue every 5 (five) minutes as needed for chest pain (Do  not tak more than 3 tablets in any 24 hour period). 30 tablet 0   No current facility-administered medications for this visit.   Allergies: Allergies  Allergen Reactions  . Codeine Nausea And Vomiting  . Contrast Media [Iodinated Diagnostic Agents] Swelling    The patient had mild lip swelling at the end of the case without rash or respiratory distress. This might be due to dye reaction. 2017   Social History: Social History   Socioeconomic History  . Marital status: Married  Spouse name: Not on file  . Number of children: Not on file  . Years of education: Not on file  . Highest education level: Not on file  Occupational History  . Not on file  Tobacco Use  . Smoking status: Former Smoker    Packs/day: 0.75    Years: 15.00    Pack years: 11.25    Types: Cigarettes    Quit date: 2007    Years since quitting: 14.3  . Smokeless tobacco: Never Used  Substance and Sexual Activity  . Alcohol use: Yes    Comment: occ  . Drug use: Never  . Sexual activity: Yes  Other Topics Concern  . Not on file  Social History Narrative  . Not on file   Social Determinants of Health   Financial Resource Strain:   . Difficulty of Paying Living Expenses:   Food Insecurity:   . Worried About Charity fundraiser in the Last Year:   . Arboriculturist in the Last Year:   Transportation Needs:   . Film/video editor (Medical):   Marland Kitchen Lack of Transportation (Non-Medical):   Physical Activity:   . Days of Exercise per Week:   . Minutes of Exercise per Session:   Stress:   . Feeling of Stress :   Social Connections:   . Frequency of Communication with Friends and Family:   . Frequency of Social Gatherings with Friends and Family:   . Attends Religious Services:   . Active Member of Clubs or Organizations:   . Attends Archivist Meetings:   Marland Kitchen Marital Status:    Lives in a 48 year old home. Smoking: denies Occupation: Surveyor, mining HistoryJournalist, newspaper in the house: no Charity fundraiser in the family room: no Carpet in the bedroom: no Heating: gas Cooling: central Pet: yes 3 dogs x 36yrs, 1.5 yr, 3 cats x 1 yr  Family History: Family History  Problem Relation Age of Onset  . Heart disease Mother   . Parkinson's disease Father    Problem                               Relation Asthma                                   No  Eczema                                Daughter  Food allergy                          No  Allergic rhino conjunctivitis     No  Hives/angioedema  No  Review of Systems  Constitutional: Negative for appetite change, chills, fever and unexpected weight change.  HENT: Positive for postnasal drip. Negative for congestion and rhinorrhea.   Eyes: Negative for itching.  Respiratory: Negative for cough, chest tightness, shortness of breath and wheezing.   Cardiovascular: Negative for chest pain.  Gastrointestinal: Negative for abdominal pain.  Genitourinary: Negative for difficulty urinating.  Skin: Positive for rash.  Allergic/Immunologic: Negative for food allergies.  Neurological: Negative for headaches.   Objective: BP (!) 150/80 (BP Location: Left Arm, Patient Position: Sitting, Cuff Size: Normal)   Pulse 66  Temp 97.8 F (36.6 C) (Temporal)   Resp 16   Ht 5' 6.5" (1.689 m)   Wt 162 lb (73.5 kg)   SpO2 95%   BMI 25.76 kg/m  Body mass index is 25.76 kg/m. Physical Exam  Constitutional: She is oriented to person, place, and time. She appears well-developed and well-nourished.  HENT:  Head: Normocephalic and atraumatic.  Right Ear: External ear normal.  Left Ear: External ear normal.  Nose: Nose normal.  Mouth/Throat: Oropharynx is clear and moist.  Right upper lip swelling noted.   Eyes: Conjunctivae and EOM are normal.  Cardiovascular: Normal rate, regular rhythm and normal heart sounds. Exam reveals no gallop and no friction rub.  No murmur heard. Pulmonary/Chest: Effort normal and breath  sounds normal. She has no wheezes. She has no rales.  Abdominal: Soft.  Musculoskeletal:     Cervical back: Neck supple.  Neurological: She is alert and oriented to person, place, and time.  Skin: Skin is warm. Rash noted.  Diffuse circular erythematous hives throughout the torso, upper extremities b/l and thighs b/l.  Psychiatric: She has a normal mood and affect. Her behavior is normal.  Nursing note and vitals reviewed.  The plan was reviewed with the patient/family, and all questions/concerned were addressed.  It was my pleasure to see Kim Villarreal today and participate in her care. Please feel free to contact me with any questions or concerns.  Sincerely,  Rexene Alberts, DO Allergy & Immunology  [facility] office: [phone] [facility] office: [place] office: [phone]

## 2020-03-09 ENCOUNTER — Ambulatory Visit (INDEPENDENT_AMBULATORY_CARE_PROVIDER_SITE_OTHER): Admitting: Allergy

## 2020-03-09 ENCOUNTER — Encounter: Payer: Self-pay | Admitting: Allergy

## 2020-03-09 ENCOUNTER — Other Ambulatory Visit: Payer: Self-pay

## 2020-03-09 VITALS — BP 150/80 | HR 66 | Temp 97.8°F | Resp 16 | Ht 66.5 in | Wt 162.0 lb

## 2020-03-09 DIAGNOSIS — T783XXD Angioneurotic edema, subsequent encounter: Secondary | ICD-10-CM | POA: Diagnosis not present

## 2020-03-09 DIAGNOSIS — L508 Other urticaria: Secondary | ICD-10-CM

## 2020-03-09 DIAGNOSIS — R0982 Postnasal drip: Secondary | ICD-10-CM

## 2020-03-09 DIAGNOSIS — T783XXA Angioneurotic edema, initial encounter: Secondary | ICD-10-CM | POA: Insufficient documentation

## 2020-03-09 MED ORDER — CETIRIZINE HCL 10 MG PO TABS: ORAL_TABLET | ORAL | 2 refills | Status: DC

## 2020-03-09 MED ORDER — FAMOTIDINE 20 MG PO TABS: 20.0000 mg | ORAL_TABLET | Freq: Two times a day (BID) | ORAL | 2 refills | Status: DC

## 2020-03-09 NOTE — Patient Instructions (Addendum)
   Start cetirizine 10mg  twice a day.  May increase to 10mg  2 tablets twice a day if hives not controlled.   Start famotidine (pepcid) 20mg  twice a day.  Read about Xolair injections.   If swelling does not subside within the next 2 days or gets worse let us know.   . Get bloodwork:  o We are ordering labs, so please allow 1-2 weeks for the results to come back. o With the newly implemented Cures Act, the labs might be visible to you at the same time that they become visible to me. However, I will not address the results until all of the results are back, so please be patient.  o In the meantime, continue recommendations in your patient instructions, including avoidance measures (if applicable), until you hear from me.  . Avoid the following potential triggers: alcohol, tight clothing, NSAIDs.  . For mild symptoms you can take over the counter antihistamines such as Benadryl and monitor symptoms closely. If symptoms worsen or if you have severe symptoms including breathing issues, throat closure, significant swelling, whole body hives, severe diarrhea and vomiting, lightheadedness then seek immediate medical care.  Follow up in 4 weeks or sooner if needed.

## 2020-03-09 NOTE — Assessment & Plan Note (Signed)
   See assessment and plan as above for urticaria.  

## 2020-03-09 NOTE — Assessment & Plan Note (Addendum)
Some PND which the zyrtec seems to help with. Skin testing as a child was positive to mold and cat per patient report - done in [place].   The above antihistamine regimen may help with this symptom. Monitor.   Get environmental allergy panel via bloodwork instead of skin prick testing due to her current hive outbreak.

## 2020-03-09 NOTE — Assessment & Plan Note (Addendum)
Rash started in February 2021 which is now persistent and spreading with facial angioedema. Denies any associated respiratory compromise. Benadryl and zyrtec somewhat helpful. 1 course of prednisone which cleared up the hives. Denies any changes in diet, meds, personal care products or recent infections. March 2021 CBC diff and CMP normal.  . Based on clinical history, she likely has chronic idiopathic urticaria. Discussed with patient, that urticaria is usually caused by release of histamine by cutaneous mast cells but sometimes it is non-histamine mediated. Explained that urticaria is not always associated with allergies, and may be related to other infectious or autoimmune causes. In most cases, the exact etiology for urticaria can not be established and it is considered idiopathic. Marland Kitchen No indication for any environmental/food testing today.  . Discussed the role of adding omalizumab  (anti-IgE antibodies) in controlling urticaria in refractory patients. Informational pamphlet given. . Meanwhile start the following medications:   Start cetirizine 10mg  twice a day.  May increase to 10mg  2 tablets twice a day if hives not controlled.   Start famotidine (Pepcid) 20mg  twice a day. . Avoid the following potential triggers: alcohol, tight clothing, NSAIDs.   If swelling does not subside within the next 2 days or gets worse let us know and may need a short course of oral prednisone at that time.  . Get bloodwork to rule out other etiologies.  . Avoid the following potential triggers: alcohol, tight clothing, NSAIDs.  . For mild symptoms you can take over the counter antihistamines such as Benadryl and monitor symptoms closely. If symptoms worsen or if you have severe symptoms including breathing issues, throat closure, significant swelling, whole body hives, severe diarrhea and vomiting, lightheadedness then seek immediate medical care.

## 2020-03-18 LAB — ALPHA-GAL PANEL
Alpha Gal IgE*: <0.1 kU/L (ref <0.10)
Beef (Bos spp) IgE: <0.1 kU/L (ref <0.35)
Class Interpretation: 0
Class Interpretation: 0
Class Interpretation: 0
Lamb/Mutton (Ovis spp) IgE: <0.1 kU/L (ref <0.35)
Pork (Sus spp) IgE: <0.1 kU/L (ref <0.35)

## 2020-03-18 LAB — PROTEIN ELECTROPHORESIS, URINE REFLEX
Albumin ELP, Urine: 28.4 %
Alpha-1-Globulin, U: 3.1 %
Alpha-2-Globulin, U: 18.5 %
Beta Globulin, U: 31 %
Gamma Globulin, U: 19.1 %
Protein, Ur: 17.9 mg/dL

## 2020-03-18 LAB — C1 ESTERASE INHIBITOR: C1INH SerPl-mCnc: 33 mg/dL (ref 21–39)

## 2020-03-18 LAB — ALLERGENS W/TOTAL IGE AREA 2
Alternaria Alternata IgE: <0.1 kU/L
Aspergillus Fumigatus IgE: <0.1 kU/L
Bermuda Grass IgE: <0.1 kU/L
Cat Dander IgE: <0.1 kU/L
Cedar, Mountain IgE: <0.1 kU/L
Cladosporium Herbarum IgE: <0.1 kU/L
Cockroach, German IgE: <0.1 kU/L
Common Silver Birch IgE: <0.1 kU/L
Cottonwood IgE: <0.1 kU/L
D Farinae IgE: <0.1 kU/L
D Pteronyssinus IgE: <0.1 kU/L
Dog Dander IgE: <0.1 kU/L
Elm, American IgE: <0.1 kU/L
IgE (Immunoglobulin E), Serum: 3 IU/mL — ABNORMAL LOW (ref 6–495)
Johnson Grass IgE: <0.1 kU/L
Maple/Box Elder IgE: <0.1 kU/L
Mouse Urine IgE: <0.1 kU/L
Oak, White IgE: <0.1 kU/L
Pecan, Hickory IgE: <0.1 kU/L
Penicillium Chrysogen IgE: <0.1 kU/L
Pigweed, Rough IgE: <0.1 kU/L
Ragweed, Short IgE: <0.1 kU/L
Sheep Sorrel IgE Qn: <0.1 kU/L
Timothy Grass IgE: <0.1 kU/L
White Mulberry IgE: <0.1 kU/L

## 2020-03-18 LAB — ANA W/REFLEX: Anti Nuclear Antibody (ANA): NEGATIVE

## 2020-03-18 LAB — PROTEIN ELECTROPHORESIS, SERUM
A/G Ratio: 1.4 (ref 0.7–1.7)
Albumin ELP: 4.1 g/dL (ref 2.9–4.4)
Alpha 1: 0.2 g/dL (ref 0.0–0.4)
Alpha 2: 0.9 g/dL (ref 0.4–1.0)
Beta: 1.1 g/dL (ref 0.7–1.3)
Gamma Globulin: 0.8 g/dL (ref 0.4–1.8)
Globulin, Total: 3 g/dL (ref 2.2–3.9)
Total Protein: 7.1 g/dL (ref 6.0–8.5)

## 2020-03-18 LAB — C3 AND C4
Complement C3, Serum: 164 mg/dL (ref 82–167)
Complement C4, Serum: 37 mg/dL (ref 12–38)

## 2020-03-18 LAB — THYROID CASCADE PROFILE: TSH: 2.31 u[IU]/mL (ref 0.450–4.500)

## 2020-03-18 LAB — C-REACTIVE PROTEIN: CRP: 9 mg/L (ref 0–10)

## 2020-03-18 LAB — TRYPTASE: Tryptase: 4.8 ug/L (ref 2.2–13.2)

## 2020-03-18 LAB — CHRONIC URTICARIA: cu index: 16.7 — ABNORMAL HIGH (ref <10)

## 2020-03-18 LAB — SEDIMENTATION RATE: Sed Rate: 16 mm/hr (ref 0–32)

## 2020-03-18 LAB — C1 ESTERASE INHIBITOR, FUNCTIONAL: C1INH Functional/C1INH Total MFr SerPl: 92 %mean normal

## 2020-04-06 ENCOUNTER — Ambulatory Visit: Admitting: Allergy

## 2020-04-20 ENCOUNTER — Other Ambulatory Visit: Payer: Self-pay | Admitting: Cardiovascular Disease

## 2020-04-24 ENCOUNTER — Ambulatory Visit: Admitting: Cardiovascular Disease

## 2020-05-14 IMAGING — DX PORTABLE CHEST - 1 VIEW
1 series · 1 of 1 positions shown · non-contrast
Comparison: 10/01/2018 chest radiograph.

CLINICAL DATA: Cough

EXAM:
PORTABLE CHEST 1 VIEW

[chest ap]
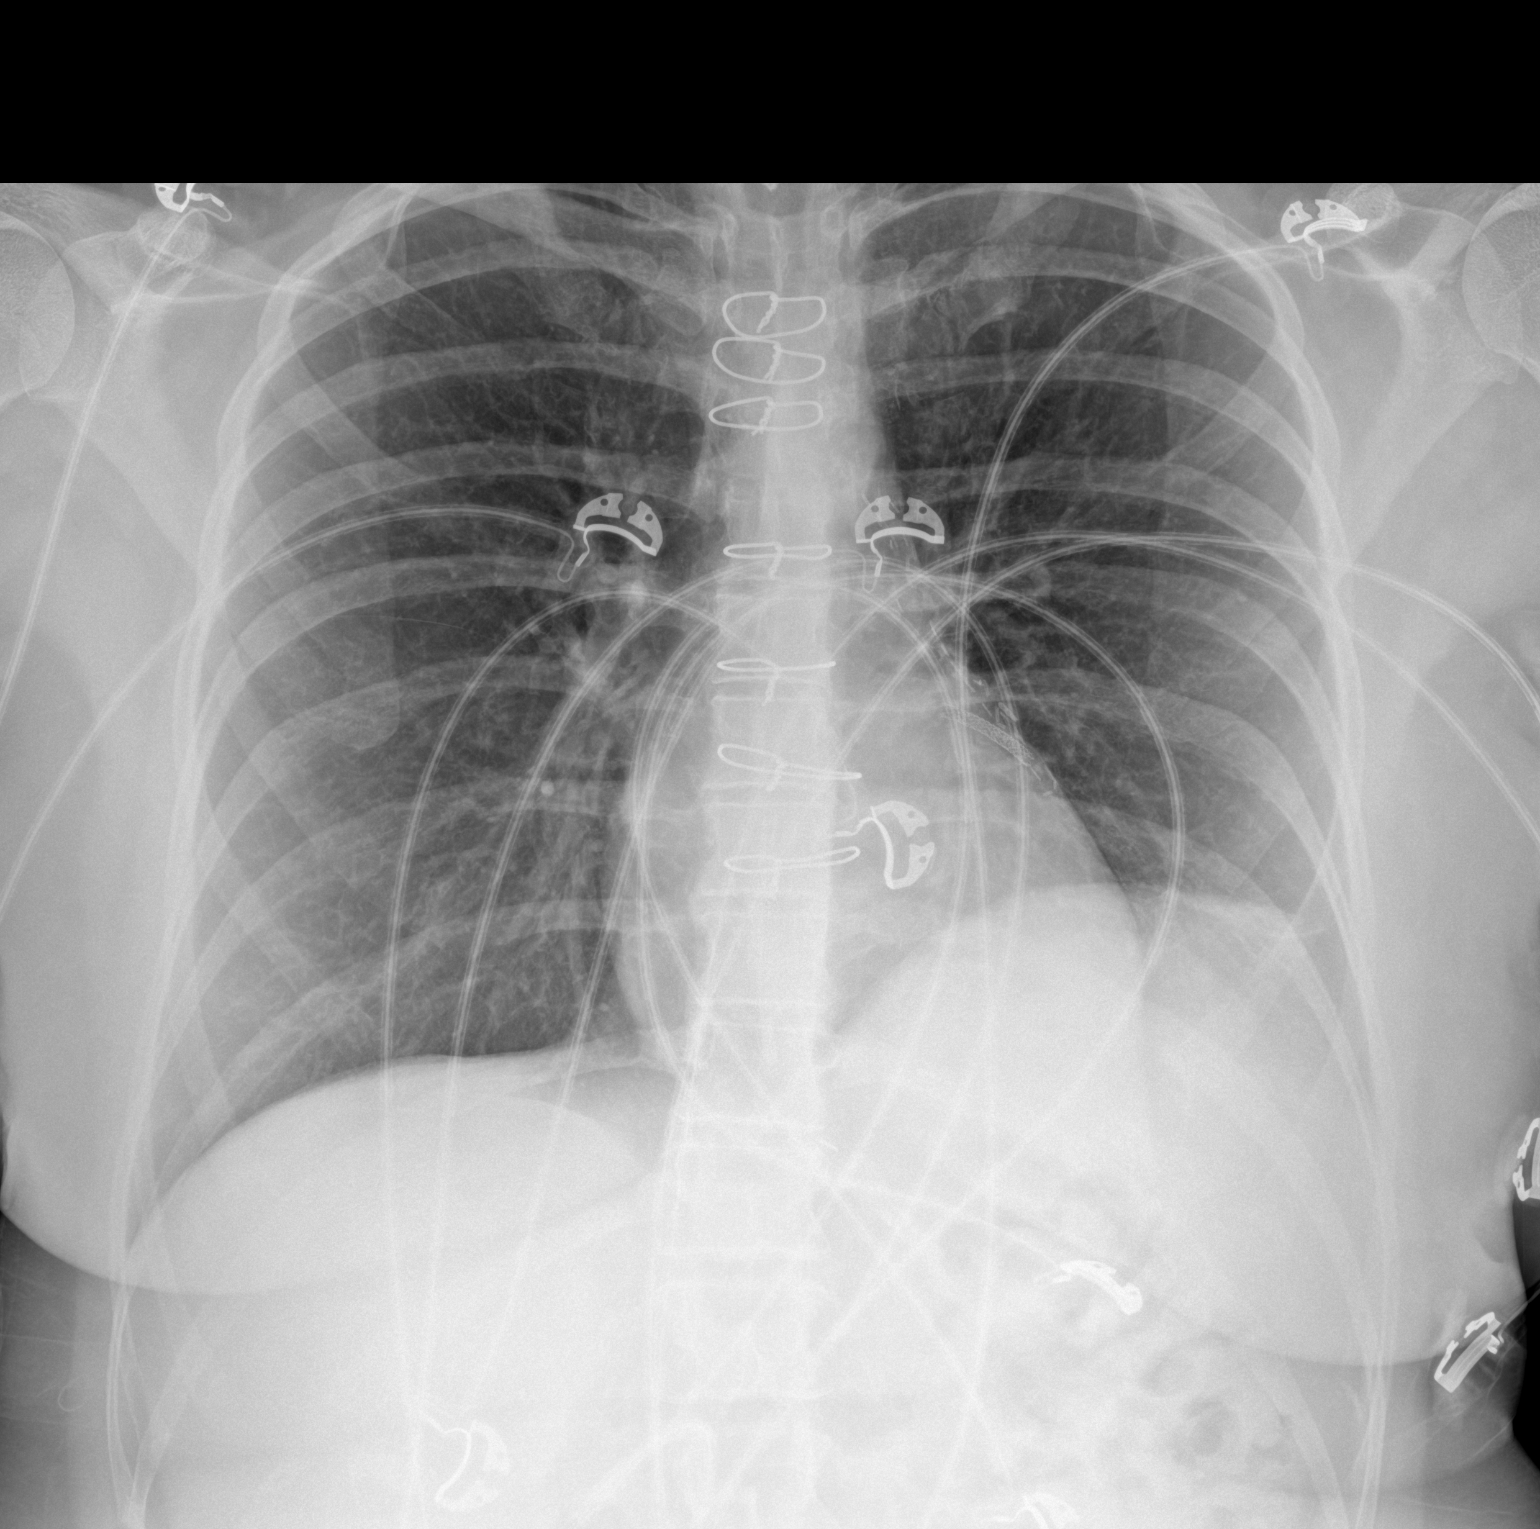

[1 of 1 positions shown; findings below may reference images not displayed]

FINDINGS: Intact sternotomy wires. Stable cardiomediastinal silhouette with
normal heart size. No pneumothorax. No pleural effusion. Lungs
appear clear, with no acute consolidative airspace disease and no
pulmonary edema.
IMPRESSION: No active disease.

## 2020-05-21 ENCOUNTER — Other Ambulatory Visit: Payer: Self-pay

## 2020-05-21 ENCOUNTER — Encounter: Payer: Self-pay | Admitting: Cardiovascular Disease

## 2020-05-21 ENCOUNTER — Ambulatory Visit (INDEPENDENT_AMBULATORY_CARE_PROVIDER_SITE_OTHER): Admitting: Cardiovascular Disease

## 2020-05-21 VITALS — BP 150/66 | HR 70 | Ht 66.0 in | Wt 156.0 lb

## 2020-05-21 DIAGNOSIS — I10 Essential (primary) hypertension: Secondary | ICD-10-CM

## 2020-05-21 DIAGNOSIS — E785 Hyperlipidemia, unspecified: Secondary | ICD-10-CM | POA: Diagnosis not present

## 2020-05-21 DIAGNOSIS — I2510 Atherosclerotic heart disease of native coronary artery without angina pectoris: Secondary | ICD-10-CM

## 2020-05-21 NOTE — Progress Notes (Signed)
Cardiology Office Note   Date:  05/21/2020   ID:  Kim Villarreal, DOB 1971/02/15, MRN [id number]  PCP:  Derinda Late, MD  Cardiologist:   [name], MD   Chief Complaint  Patient presents with  . Other    6 month follow up. Meds reviewed verbally with patient.       History of Present Illness: Kim Villarreal is a 49 y.o. female who presents for a follow-up visit regarding coronary artery disease.  She is a previous smoker . She has no diabetes or hyperlipidemia.  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  She had recurrent angina in June. 2020 in the setting of significantly elevated blood pressure with exercise.  She underwent cardiac catheterization on June 29 which showed occluded proximal LAD with patent LIMA to LAD with moderate anastomosis lesion estimated to be 40%.  EF was normal with mildly elevated left ventricular end-diastolic pressure a 3-day ZIO patch monitor which showed normal sinus rhythm with an average heart rate of 66 bpm.  Frequent PACs and rare PVCs.    Given baseline bradycardia, she was not started on a beta-blocker.  Blood pressure improved with addition of amlodipine to losartan.  She has not had any chest pain since her last visit.  She exercises regularly with a trainer 3 times a week and does cardio workout the rest of the week.  She gets her heart rate between 140 and 170 bpm with no significant symptoms.  She was diagnosed with idiopathic urticaria in April but had negative work-up and she improved. She is doing the keto diet now and trying to lose more weight.   Past Medical History:  Diagnosis Date  . Anemia    "as a child"  . Anxiety   . Bursitis   . Childhood asthma   . Chronic lower back pain   . Coronary artery disease    a. 09/2016: cath showing 95% stenosis of the mid-LAD. Xience 2.5x69mm DES placed, remaining left and right coronary tree angiographically normal, lip  swelling noted at the end of the case without rash or respiratory distress, treated with IV Solu-Medrol and Benadryl.  Marland Kitchen History of nuclear stress test    a. 07/2016: no sig ischemia, nl wall motion, EF 72%, small defect along mid to apical anteroseptal wall c/w breast attenuation artifact, low risk study  . Hypertension   . Migraine    "it was a bout; beginning of 2017; for ~ 1 month; went away" (05/23/2018)  . Mitral regurgitation    a. echo 08/2016: vigorous EF 65-70%, nl wall motion, nl LV diastolic fxn, mild MR  . Tendonitis   . Urticaria     Past Surgical History:  Procedure Laterality Date  . CARDIAC CATHETERIZATION N/A 10/05/2016   Procedure: Left Heart Cath and Coronary Angiography;  Surgeon: Wellington Hampshire, MD;  Location: [facility];  Service: Cardiovascular;  Laterality: N/A;  . CARDIAC CATHETERIZATION N/A 10/05/2016   Procedure: Coronary Stent Intervention;  Surgeon: Wellington Hampshire, MD;  Location: [place] [facility];  Service: Cardiovascular;  Laterality: N/A;  . CESAREAN SECTION  2003; 2005  . CORONARY ANGIOPLASTY WITH STENT PLACEMENT  05/23/2018   "2 stents"  . CORONARY ARTERY BYPASS GRAFT N/A 08/27/2018   Procedure: CORONARY ARTERY BYPASS GRAFTING (CABG) x 1 using LEFT INTERNAL MAMMARY ARTERY;  Surgeon: Gaye Pollack, MD;  Location: [facility];  Service: Open Heart  Surgery;  Laterality: N/A;  . CORONARY STENT INTERVENTION N/A 05/23/2018   Procedure: CORONARY STENT INTERVENTION;  Surgeon: Wellington Hampshire, MD;  Location: [facility];  Service: Cardiovascular;  Laterality: N/A;  mid LAD  . ECTOPIC PREGNANCY SURGERY  1991  . LEFT HEART CATH AND CORONARY ANGIOGRAPHY N/A 05/23/2018   Procedure: LEFT HEART CATH AND CORONARY ANGIOGRAPHY;  Surgeon: Wellington Hampshire, MD;  Location: [place];  Service: Cardiovascular;  Laterality: N/A;  . LEFT HEART CATH AND CORONARY ANGIOGRAPHY N/A 08/22/2018   Procedure: LEFT HEART CATH AND CORONARY ANGIOGRAPHY;  Surgeon: Wellington Hampshire, MD;  Location: [place];  Service: Cardiovascular;  Laterality: N/A;  . LEFT HEART CATH AND CORS/GRAFTS ANGIOGRAPHY Left 05/20/2019   Procedure: LEFT HEART CATH AND CORS/GRAFTS ANGIOGRAPHY;  Surgeon: Wellington Hampshire, MD;  Location: [place] [facility];  Service: Cardiovascular;  Laterality: Left;  . TEE WITHOUT CARDIOVERSION N/A 08/27/2018   Procedure: TRANSESOPHAGEAL ECHOCARDIOGRAM (TEE);  Surgeon: Gaye Pollack, MD;  Location: [facility];  Service: Open Heart Surgery;  Laterality: N/A;     Current Outpatient Medications  Medication Sig Dispense Refill  . amLODipine (NORVASC) 5 MG tablet TAKE 1 TABLET(5 MG) BY MOUTH DAILY 30 tablet 5  . aspirin 325 MG EC tablet Take 1 tablet (325 mg total) by mouth daily.    . cetirizine (ZYRTEC) 10 MG tablet Take 10 mg by mouth daily.    Marland Kitchen losartan (COZAAR) 100 MG tablet Take 1 tablet (100 mg total) by mouth daily. 90 tablet 3  . Multiple Vitamins-Minerals (MULTIVITAMIN GUMMIES ADULT PO) Take 1 Units by mouth daily.    . nitrofurantoin (MACRODANTIN) 25 MG capsule Take 25 mg by mouth daily as needed (for UTI prevention).     . nitroGLYCERIN (NITROSTAT) 0.4 MG SL tablet Place 1 tablet (0.4 mg total) under the tongue every 5 (five) minutes as needed for chest pain (Do not tak more than 3 tablets in any 24 hour period). 30 tablet 0  . Omega-3 Fatty Acids (FISH OIL) 1000 MG CAPS Take by mouth. Patient taking liquid fish oil. 1 tsp twice daily    . rosuvastatin (CRESTOR) 5 MG tablet TAKE 1 TABLET DAILY 90 tablet 0  . Saccharomyces boulardii (PROBIOTIC) 250 MG CAPS Take 1 capsule by mouth daily.    Marland Kitchen venlafaxine (EFFEXOR) 37.5 MG tablet      No current facility-administered medications for this visit.    Allergies:   Codeine and Contrast media [iodinated diagnostic agents]    Social History:  The patient  reports that she quit smoking about 14 years ago. Her smoking use included cigarettes. She has a 11.25 pack-year smoking history. She has  never used smokeless tobacco. She reports current alcohol use. She reports that she does not use drugs.   Family History:  The patient's family history includes Heart disease in her mother; Parkinson's disease in her father.    ROS:  Please see the history of present illness.   Otherwise, review of systems are positive for none.   All other systems are reviewed and negative.    PHYSICAL EXAM: VS:  BP (!) 150/66 (BP Location: Left Arm, Patient Position: Sitting, Cuff Size: Normal)   Pulse 70   Ht 5\' 6"  (1.676 m)   Wt 156 lb (70.8 kg)   SpO2 97%   BMI 25.18 kg/m  , BMI Body mass index is 25.18 kg/m. GEN: Well nourished, well developed, in no acute distress  HEENT: normal  Neck: no JVD, carotid bruits, or masses Cardiac: RRR; no  rubs, or gallops,no edema . 1/6 systolic ejection murmur in the aortic area. Respiratory:  clear to auscultation bilaterally, normal work of breathing GI: soft, nontender, nondistended, + BS MS: no deformity or atrophy  Skin: warm and dry, no rash Neuro:  Strength and sensation are intact Psych: euthymic mood, full affect Femoral pulses diminished bilaterally.  EKG:  EKG is  ordered today. EKG showed normal sinus rhythm with possible left atrial enlargement.  No significant ST or T wave changes.   Recent Labs: 11/08/2019: ALT 19; BUN 17; Creatinine, Ser 0.92; Hemoglobin 11.5; Platelets 189; Potassium 4.4; Sodium 139 03/09/2020: TSH 2.310    Lipid Panel    Component Value Date/Time   CHOL 138 11/08/2019 1452   TRIG 98 11/08/2019 1452   HDL 56 11/08/2019 1452   CHOLHDL 2.5 11/08/2019 1452   CHOLHDL 2.8 03/09/2017 0855   VLDL 11 03/09/2017 0855   LDLCALC 64 11/08/2019 1452   LDLDIRECT 61 09/17/2018 1051      Wt Readings from Last 3 Encounters:  05/21/20 156 lb (70.8 kg)  03/09/20 162 lb (73.5 kg)  11/08/19 150 lb 8 oz (68.3 kg)      PAD Screen 08/05/2016  Previous PAD dx? No  Previous surgical procedure? No  Pain with walking? No   Feet/toe relief with dangling? No  Painful, non-healing ulcers? No  Extremities discolored? No      ASSESSMENT AND PLAN:  1.  Coronary artery disease involving native coronary arteries without angina: She is status post one-vessel CABG with LIMA to LAD in October 2019.   She is doing well with no anginal symptoms at the present time.  Continue medical therapy.  2. Hyperlipidemia: She is tolerating small dose rosuvastatin.  Most recent lipid profile with her primary care physician was 85.  It was 61 last year.  She prefers not to increase the medication and she will continue to work on her diet and exercise.  3. Essential hypertension: Blood pressure is mildly elevated but more controlled at home.  Again she prefers not to increase any medication and she is expecting to lose 15 to 20 lbs over the next 6 months.    Disposition:   FU with me in 6 months.  Signed,  [name], MD  05/21/2020 11:53 AM    [place]

## 2020-05-21 NOTE — Patient Instructions (Signed)
Medication Instructions:  Your physician recommends that you continue on your current medications as directed. Please refer to the Current Medication list given to you today.  *If you need a refill on your cardiac medications before your next appointment, please call your pharmacy*   Lab Work: None ordered If you have labs (blood work) drawn today and your tests are completely normal, you will receive your results only by: . MyChart Message (if you have MyChart) OR . A paper copy in the mail If you have any lab test that is abnormal or we need to change your treatment, we will call you to review the results.   Testing/Procedures: None ordered   Follow-Up: At [facility], you and your health needs are our priority.  As part of our continuing mission to provide you with exceptional heart care, we have created designated Provider Care Teams.  These Care Teams include your primary Cardiologist (physician) and Advanced Practice Providers (APPs -  Physician Assistants and Nurse Practitioners) who all work together to provide you with the care you need, when you need it.  We recommend signing up for the patient portal called "MyChart".  Sign up information is provided on this After Visit Summary.  MyChart is used to connect with patients for Virtual Visits (Telemedicine).  Patients are able to view lab/test results, encounter notes, upcoming appointments, etc.  Non-urgent messages can be sent to your provider as well.   To learn more about what you can do with MyChart, go to [email or web address].    Your next appointment:   6 month(s)  The format for your next appointment:   In Person  Provider:    You may see Dr. Arida or one of the following Advanced Practice Providers on your designated Care Team:    Christopher Berge, NP  Ryan Dunn, PA-C  Jacquelyn Visser, PA-C    Other Instructions N/A  

## 2020-06-25 ENCOUNTER — Other Ambulatory Visit: Payer: Self-pay | Admitting: Cardiovascular Disease

## 2020-07-22 ENCOUNTER — Other Ambulatory Visit: Payer: Self-pay | Admitting: Cardiovascular Disease

## 2020-08-21 ENCOUNTER — Other Ambulatory Visit: Payer: Self-pay | Admitting: Cardiovascular Disease

## 2020-10-19 ENCOUNTER — Other Ambulatory Visit (HOSPITAL_COMMUNITY): Payer: Self-pay

## 2020-10-20 ENCOUNTER — Encounter: Payer: Self-pay | Admitting: Nurse Practitioner

## 2020-10-20 ENCOUNTER — Telehealth: Payer: Self-pay | Admitting: Nurse Practitioner

## 2020-10-20 DIAGNOSIS — U071 COVID-19: Secondary | ICD-10-CM

## 2020-10-20 NOTE — Telephone Encounter (Signed)
Called to Discuss with patient about Covid symptoms and the use of a monoclonal antibody infusion for those with mild to moderate Covid symptoms and at a high risk of hospitalization.     Pt is qualified for this infusion at the [facility] center due to co-morbid conditions and/or a member of an at-risk group.     Unable to reach pt. Left message to return call. Sent [employer].   Beckey Rutter, [place], AGNP-C [phone] ([place])

## 2020-11-03 ENCOUNTER — Other Ambulatory Visit: Payer: Self-pay | Admitting: Cardiovascular Disease

## 2020-11-03 NOTE — Telephone Encounter (Signed)
Rx request sent to pharmacy.  

## 2020-12-03 ENCOUNTER — Other Ambulatory Visit: Payer: Self-pay | Admitting: Cardiovascular Disease

## 2020-12-09 ENCOUNTER — Other Ambulatory Visit: Payer: Self-pay | Admitting: *Deleted

## 2020-12-09 MED ORDER — AMLODIPINE BESYLATE 5 MG PO TABS: ORAL_TABLET | ORAL | 0 refills | Status: DC

## 2020-12-25 ENCOUNTER — Other Ambulatory Visit: Payer: Self-pay

## 2020-12-25 ENCOUNTER — Encounter: Payer: Self-pay | Admitting: Cardiovascular Disease

## 2020-12-25 ENCOUNTER — Ambulatory Visit (INDEPENDENT_AMBULATORY_CARE_PROVIDER_SITE_OTHER): Admitting: Cardiovascular Disease

## 2020-12-25 VITALS — BP 146/72 | HR 64 | Ht 66.5 in | Wt 165.0 lb

## 2020-12-25 DIAGNOSIS — R0602 Shortness of breath: Secondary | ICD-10-CM

## 2020-12-25 DIAGNOSIS — I10 Essential (primary) hypertension: Secondary | ICD-10-CM | POA: Diagnosis not present

## 2020-12-25 DIAGNOSIS — E785 Hyperlipidemia, unspecified: Secondary | ICD-10-CM | POA: Diagnosis not present

## 2020-12-25 DIAGNOSIS — R079 Chest pain, unspecified: Secondary | ICD-10-CM

## 2020-12-25 DIAGNOSIS — I2510 Atherosclerotic heart disease of native coronary artery without angina pectoris: Secondary | ICD-10-CM

## 2020-12-25 MED ORDER — HYDROCHLOROTHIAZIDE 12.5 MG PO CAPS: 12.5000 mg | ORAL_CAPSULE | Freq: Every day | ORAL | 2 refills | Status: DC

## 2020-12-25 MED ORDER — ROSUVASTATIN CALCIUM 10 MG PO TABS
10.0000 mg | ORAL_TABLET | Freq: Every day | ORAL | 2 refills | Status: DC
Start: 2020-12-25 — End: 2021-07-15

## 2020-12-25 NOTE — Progress Notes (Signed)
Cardiology Office Note   Date:  12/25/2020   ID:  [name], DOB July 11, 1971, MRN [id number]  PCP:  Derinda Late, MD  Cardiologist:   [name], MD   Chief Complaint  Patient presents with  . OTher    6 month follow up. Patient c.o chest pain, SOB and Swelling in ankles. Meds reviewed verbally with patient.       History of Present Illness: PATRICI Villarreal is a 50 y.o. female who presents for a follow-up visit regarding coronary artery disease.  She is a previous smoker . She has no diabetes or hyperlipidemia.  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  She had recurrent angina in June. 2020 in the setting of significantly elevated blood pressure with exercise.  She underwent cardiac catheterization on June 29 which showed occluded proximal LAD with patent LIMA to LAD with moderate anastomosis lesion estimated to be 40%.  EF was normal with mildly elevated left ventricular end-diastolic pressure .  She tried to lose weight last year but was not able to do so.  If anything, she gained some weight.  In addition, she had COVID-19 infection in November and was sick for about 10 days.  During that time, she had pleuritic chest pain and shortness of breath.  She feels that she never returned to normal after that.  She continues to have intermittent episodes of chest pain at rest.  In addition, she has noted worsening exertional dyspnea.  She continues to exercise but cut down a little bit compared to before.  Blood pressure continues to be elevated.  She reports frequent swelling in her legs at the end of the day. She has been under increased stress also due to [place]  She is still working on Geophysicist/field seismologist.   Past Medical History:  Diagnosis Date  . Anemia    "as a child"  . Anxiety   . Bursitis   . Childhood asthma   . Chronic lower back pain   .  Coronary artery disease    a. 09/2016: cath showing 95% stenosis of the mid-LAD. Xience 2.5x51mm DES placed, remaining left and right coronary tree angiographically normal, lip swelling noted at the end of the case without rash or respiratory distress, treated with IV Solu-Medrol and Benadryl.  Marland Kitchen History of nuclear stress test    a. 07/2016: no sig ischemia, nl wall motion, EF 72%, small defect along mid to apical anteroseptal wall c/w breast attenuation artifact, low risk study  . Hypertension   . Migraine    "it was a bout; beginning of 2017; for ~ 1 month; went away" (05/23/2018)  . Mitral regurgitation    a. echo 08/2016: vigorous EF 65-70%, nl wall motion, nl LV diastolic fxn, mild MR  . Tendonitis   . Urticaria     Past Surgical History:  Procedure Laterality Date  . CARDIAC CATHETERIZATION N/A 10/05/2016   Procedure: Left Heart Cath and Coronary Angiography;  Surgeon: Wellington Hampshire, MD;  Location: [place] [facility];  Service: Cardiovascular;  Laterality: N/A;  . CARDIAC CATHETERIZATION N/A 10/05/2016   Procedure: Coronary Stent Intervention;  Surgeon: Wellington Hampshire, MD;  Location: [place];  Service: Cardiovascular;  Laterality: N/A;  . CESAREAN SECTION  2003; 2005  . CORONARY ANGIOPLASTY WITH STENT PLACEMENT  05/23/2018   "2 stents"  . CORONARY  ARTERY BYPASS GRAFT N/A 08/27/2018   Procedure: CORONARY ARTERY BYPASS GRAFTING (CABG) x 1 using LEFT INTERNAL MAMMARY ARTERY;  Surgeon: Gaye Pollack, MD;  Location: [facility];  Service: Open Heart Surgery;  Laterality: N/A;  . CORONARY STENT INTERVENTION N/A 05/23/2018   Procedure: CORONARY STENT INTERVENTION;  Surgeon: Wellington Hampshire, MD;  Location: [facility];  Service: Cardiovascular;  Laterality: N/A;  mid LAD  . ECTOPIC PREGNANCY SURGERY  1991  . LEFT HEART CATH AND CORONARY ANGIOGRAPHY N/A 05/23/2018   Procedure: LEFT HEART CATH AND CORONARY ANGIOGRAPHY;  Surgeon: Wellington Hampshire, MD;  Location: [facility];  Service: Cardiovascular;  Laterality: N/A;  . LEFT HEART CATH AND CORONARY ANGIOGRAPHY N/A 08/22/2018   Procedure: LEFT HEART CATH AND CORONARY ANGIOGRAPHY;  Surgeon: Wellington Hampshire, MD;  Location: [facility];  Service: Cardiovascular;  Laterality: N/A;  . LEFT HEART CATH AND CORS/GRAFTS ANGIOGRAPHY Left 05/20/2019   Procedure: LEFT HEART CATH AND CORS/GRAFTS ANGIOGRAPHY;  Surgeon: Wellington Hampshire, MD;  Location: [place];  Service: Cardiovascular;  Laterality: Left;  . TEE WITHOUT CARDIOVERSION N/A 08/27/2018   Procedure: TRANSESOPHAGEAL ECHOCARDIOGRAM (TEE);  Surgeon: Gaye Pollack, MD;  Location: [facility];  Service: Open Heart Surgery;  Laterality: N/A;     Current Outpatient Medications  Medication Sig Dispense Refill  . amLODipine (NORVASC) 5 MG tablet TAKE 1 TABLET(5 MG) BY MOUTH DAILY 90 tablet 0  . aspirin 325 MG EC tablet Take 1 tablet (325 mg total) by mouth daily.    Marland Kitchen losartan (COZAAR) 100 MG tablet TAKE 1 TABLET(100 MG) BY MOUTH DAILY 90 tablet 3  . Multiple Vitamins-Minerals (MULTIVITAMIN GUMMIES ADULT PO) Take 1 Units by mouth daily.    . nitrofurantoin (MACRODANTIN) 25 MG capsule Take 25 mg by mouth daily as needed (for UTI prevention).     . nitroGLYCERIN (NITROSTAT) 0.4 MG SL tablet Place 1 tablet (0.4 mg total) under the tongue every 5 (five) minutes as needed for chest pain (Do not tak more than 3 tablets in any 24 hour period). 30 tablet 0  . Omega-3 Fatty Acids (FISH OIL) 1000 MG CAPS Take by mouth. Patient taking liquid fish oil. 1 tsp twice daily    . rosuvastatin (CRESTOR) 5 MG tablet TAKE 1 TABLET DAILY 90 tablet 3  . Saccharomyces boulardii (PROBIOTIC) 250 MG CAPS Take 1 capsule by mouth daily.     No current facility-administered medications for this visit.    Allergies:   Codeine and Contrast media [iodinated diagnostic agents]    Social History:  The patient  reports that she quit smoking about 15 years ago. Her smoking use included  cigarettes. She has a 11.25 pack-year smoking history. She has never used smokeless tobacco. She reports current alcohol use. She reports that she does not use drugs.   Family History:  The patient's family history includes Heart disease in her mother; Parkinson's disease in her father.    ROS:  Please see the history of present illness.   Otherwise, review of systems are positive for none.   All other systems are reviewed and negative.    PHYSICAL EXAM: VS:  BP (!) 146/72 (BP Location: Left Arm, Patient Position: Sitting, Cuff Size: Normal)   Pulse 64   Ht 5' 6.5" (1.689 m)   Wt 165 lb (74.8 kg)   BMI 26.23 kg/m  , BMI Body mass index is 26.23 kg/m. GEN: Well nourished, well developed, in no  acute distress  HEENT: normal  Neck: no JVD, carotid bruits, or masses Cardiac: RRR; no  rubs, or gallops,no edema . 1/6 systolic ejection murmur in the aortic area. Respiratory:  clear to auscultation bilaterally, normal work of breathing GI: soft, nontender, nondistended, + BS MS: no deformity or atrophy  Skin: warm and dry, no rash Neuro:  Strength and sensation are intact Psych: euthymic mood, full affect Femoral pulses diminished bilaterally.  EKG:  EKG is  ordered today. EKG showed normal sinus rhythm with a heart rate of 64 bpm.      Recent Labs: 03/09/2020: TSH 2.310    Lipid Panel    Component Value Date/Time   CHOL 138 11/08/2019 1452   TRIG 98 11/08/2019 1452   HDL 56 11/08/2019 1452   CHOLHDL 2.5 11/08/2019 1452   CHOLHDL 2.8 03/09/2017 0855   VLDL 11 03/09/2017 0855   LDLCALC 64 11/08/2019 1452   LDLDIRECT 61 09/17/2018 1051      Wt Readings from Last 3 Encounters:  12/25/20 165 lb (74.8 kg)  05/21/20 156 lb (70.8 kg)  03/09/20 162 lb (73.5 kg)      PAD Screen 08/05/2016  Previous PAD dx? No  Previous surgical procedure? No  Pain with walking? No  Feet/toe relief with dangling? No  Painful, non-healing ulcers? No  Extremities discolored? No       ASSESSMENT AND PLAN:  1.  Coronary artery disease involving native coronary arteries without angina: She is status post one-vessel CABG with LIMA to LAD in October 2019.   No convincing anginal symptoms.  Her current chest pain seems somewhat pleuritic and could be related to her recent COVID-19 infection.  2. Hyperlipidemia: She is tolerating small dose rosuvastatin.  Most recent lipid profile showed an LDL of 99.  I elected to increase rosuvastatin to 10 mg daily.  3. Essential hypertension: Blood pressure continues to be elevated.  I added hydrochlorothiazide 12.5 mg once daily.  Check basic metabolic profile in 1 week.  4.  Pleuritic chest pain and shortness of breath post COVID-19 infection in November.  I am going to obtain an echocardiogram to ensure no pericardial effusion.    Disposition:   FU with me in 6 months.  Signed,  [name], MD  12/25/2020 9:40 AM    [facility]

## 2020-12-25 NOTE — Patient Instructions (Signed)
Medication Instructions:  Your physician has recommended you make the following change in your medication:   INCREASE Rosuvastatin (crestor) to 10 mg daily. An Rx has been sent to your pharmacy.  START Hydrochlorothiazide 12.5 mg daily. An Rx has been sent to your pharmacy.    *If you need a refill on your cardiac medications before your next appointment, please call your pharmacy*   Lab Work: Your physician recommends that you return for lab work (bmet) in: 1 week   Please have your lab drawn at the [facility]. You do not need an appt. Lab hours are Mon-Fri 7am-6pm.  If you have labs (blood work) drawn today and your tests are completely normal, you will receive your results only by: Marland Kitchen MyChart Message (if you have MyChart) OR . A paper copy in the mail If you have any lab test that is abnormal or we need to change your treatment, we will call you to review the results.   Testing/Procedures: Your physician has requested that you have an echocardiogram. Echocardiography is a painless test that uses sound waves to create images of your heart. It provides your doctor with information about the size and shape of your heart and how well your heart's chambers and valves are working. This procedure takes approximately one hour. There are no restrictions for this procedure.     Follow-Up: At [facility], you and your health needs are our priority.  As part of our continuing mission to provide you with exceptional heart care, we have created designated Provider Care Teams.  These Care Teams include your primary Cardiologist (physician) and Advanced Practice Providers (APPs -  Physician Assistants and Nurse Practitioners) who all work together to provide you with the care you need, when you need it.  We recommend signing up for the patient portal called "MyChart".  Sign up information is provided on this After Visit Summary.  MyChart is used to connect with patients for Virtual Visits  (Telemedicine).  Patients are able to view lab/test results, encounter notes, upcoming appointments, etc.  Non-urgent messages can be sent to your provider as well.   To learn more about what you can do with MyChart, go to [email or web address].    Your next appointment:   6 month(s)  The format for your next appointment:   In Person  Provider:   You may see [name], MD or one of the following Advanced Practice Providers on your designated Care Team:    Murray Hodgkins, NP  Christell Faith, PA-C  Marrianne Mood, PA-C  Cadence Duenweg, [place]  Laurann Montana, NP    Other Instructions N/A

## 2021-01-07 ENCOUNTER — Other Ambulatory Visit: Payer: Self-pay

## 2021-01-07 ENCOUNTER — Ambulatory Visit (INDEPENDENT_AMBULATORY_CARE_PROVIDER_SITE_OTHER)

## 2021-01-07 DIAGNOSIS — R0602 Shortness of breath: Secondary | ICD-10-CM | POA: Diagnosis not present

## 2021-01-07 DIAGNOSIS — R079 Chest pain, unspecified: Secondary | ICD-10-CM

## 2021-01-07 LAB — ECHOCARDIOGRAM COMPLETE
AR max vel: 3.04 cm2
AV Area VTI: 3.08 cm2
AV Area mean vel: 3.2 cm2
AV Mean grad: 6 mmHg
AV Peak grad: 13.2 mmHg
Ao pk vel: 1.82 m/s
Area-P 1/2: 3.27 cm2
Calc EF: 57.9 %
S' Lateral: 2.8 cm
Single Plane A2C EF: 57.9 %
Single Plane A4C EF: 57.9 %

## 2021-02-19 ENCOUNTER — Ambulatory Visit

## 2021-02-19 ENCOUNTER — Other Ambulatory Visit: Payer: Self-pay

## 2021-02-19 DIAGNOSIS — Z20822 Contact with and (suspected) exposure to covid-19: Secondary | ICD-10-CM

## 2021-02-19 LAB — POC COVID19 BINAXNOW: SARS Coronavirus 2 Ag: NEGATIVE

## 2021-05-07 ENCOUNTER — Other Ambulatory Visit: Payer: Self-pay | Admitting: Cardiovascular Disease

## 2021-06-03 ENCOUNTER — Other Ambulatory Visit (HOSPITAL_COMMUNITY)
Admission: RE | Admit: 2021-06-03 | Discharge: 2021-06-03 | Disposition: A | Discharge status: Home / Self Care | Source: Ambulatory Visit | Attending: Obstetrics and Gynecology | Admitting: Obstetrics and Gynecology

## 2021-06-03 ENCOUNTER — Encounter: Payer: Self-pay | Admitting: Obstetrics and Gynecology

## 2021-06-03 ENCOUNTER — Other Ambulatory Visit: Payer: Self-pay

## 2021-06-03 ENCOUNTER — Ambulatory Visit (INDEPENDENT_AMBULATORY_CARE_PROVIDER_SITE_OTHER): Admitting: Obstetrics and Gynecology

## 2021-06-03 VITALS — BP 130/70 | Ht 66.5 in | Wt 170.4 lb

## 2021-06-03 DIAGNOSIS — Z0000 Encounter for general adult medical examination without abnormal findings: Secondary | ICD-10-CM

## 2021-06-03 DIAGNOSIS — Z124 Encounter for screening for malignant neoplasm of cervix: Secondary | ICD-10-CM

## 2021-06-03 DIAGNOSIS — N390 Urinary tract infection, site not specified: Secondary | ICD-10-CM

## 2021-06-03 DIAGNOSIS — D1722 Benign lipomatous neoplasm of skin and subcutaneous tissue of left arm: Secondary | ICD-10-CM

## 2021-06-03 DIAGNOSIS — D1721 Benign lipomatous neoplasm of skin and subcutaneous tissue of right arm: Secondary | ICD-10-CM

## 2021-06-03 DIAGNOSIS — Z01419 Encounter for gynecological examination (general) (routine) without abnormal findings: Secondary | ICD-10-CM

## 2021-06-03 MED ORDER — NITROFURANTOIN MACROCRYSTAL 25 MG PO CAPS
25.0000 mg | ORAL_CAPSULE | Freq: Every day | ORAL | 6 refills | Status: DC | PRN
Start: 2021-06-03 — End: 2024-04-23

## 2021-06-03 NOTE — Patient Instructions (Addendum)
Institute of [employer] Recommended Dietary Allowances for Calcium and Vitamin D  Age (yr) Calcium Recommended Dietary Allowance (mg/day) Vitamin D Recommended Dietary Allowance (international units/day)  9-18 1,300 600  19-50 1,000 600  51-70 1,200 600  71 and older 1,200 800  Data from [employer]. Dietary reference intakes: calcium, vitamin D. [place], [place]: National Academies Press; 2011.   Exercising to Stay Healthy To become healthy and stay healthy, it is recommended that you do moderate-intensity and vigorous-intensity exercise. You can tell that you are exercising at a moderate intensity if your heart starts beating faster and you start breathing faster but can still hold a conversation. You can tell that you are exercising at a vigorous intensity if you are breathing much harder andfaster and cannot hold a conversation while exercising. Exercising regularly is important. It has many health benefits, such as: Improving overall fitness, flexibility, and endurance. Increasing bone density. Helping with weight control. Decreasing body fat. Increasing muscle strength. Reducing stress and tension. Improving overall health. How often should I exercise? Choose an activity that you enjoy, and set realistic goals. Your health careprovider can help you make an activity plan that works for you. Exercise regularly as told by your health care provider. This may include: Doing strength training two times a week, such as: Lifting weights. Using resistance bands. Push-ups. Sit-ups. Yoga. Doing a certain intensity of exercise for a given amount of time. Choose from these options: A total of 150 minutes of moderate-intensity exercise every week. A total of 75 minutes of vigorous-intensity exercise every week. A mix of moderate-intensity and vigorous-intensity exercise every week. Children, pregnant women, people who have not exercised regularly, people who are overweight, and  older adults may need to talk with a health care provider about what activities are safe to do. If you have a medical condition, be sureto talk with your health care provider before you start a new exercise program. What are some exercise ideas? Moderate-intensity exercise ideas include: Walking 1 mile (1.6 km) in about 15 minutes. Biking. Hiking. Golfing. Dancing. Water aerobics. Vigorous-intensity exercise ideas include: Walking 4.5 miles (7.2 km) or more in about 1 hour. Jogging or running 5 miles (8 km) in about 1 hour. Biking 10 miles (16.1 km) or more in about 1 hour. Lap swimming. Roller-skating or in-line skating. Cross-country skiing. Vigorous competitive sports, such as football, basketball, and soccer. Jumping rope. Aerobic dancing. What are some everyday activities that can help me to get exercise? Yard work, such as: Pushing a lawn mower. Raking and bagging leaves. Washing your car. Pushing a stroller. Shoveling snow. Gardening. Washing windows or floors. How can I be more active in my day-to-day activities? Use stairs instead of an elevator. Take a walk during your lunch break. If you drive, park your car farther away from your work or school. If you take public transportation, get off one stop early and walk the rest of the way. Stand up or walk around during all of your indoor phone calls. Get up, stretch, and walk around every 30 minutes throughout the day. Enjoy exercise with a friend. Support to continue exercising will help you keep a regular routine of activity. What guidelines can I follow while exercising? Before you start a new exercise program, talk with your health care provider. Do not exercise so much that you hurt yourself, feel dizzy, or get very short of breath. Wear comfortable clothes and wear shoes with good support. Drink plenty of water while you exercise to prevent   dehydration or heat stroke. Work out until your breathing and your  heartbeat get faster. Where to find more information [place] Department of Health and Human Services: www.hhs.gov Centers for Disease Control and Prevention (CDC): www.cdc.gov Summary Exercising regularly is important. It will improve your overall fitness, flexibility, and endurance. Regular exercise also will improve your overall health. It can help you control your weight, reduce stress, and improve your bone density. Do not exercise so much that you hurt yourself, feel dizzy, or get very short of breath. Before you start a new exercise program, talk with your health care provider. This information is not intended to replace advice given to you by your health care provider. Make sure you discuss any questions you have with your healthcare provider. Document Revised: 10/23/2020 Document Reviewed: 10/23/2020 Elsevier Patient Education  2022 [employer]. Budget-Friendly Healthy Eating There are many ways to save money at the grocery store and continue to eat healthy. You can be successful if you: Plan meals according to your budget. Make a grocery list and only purchase food according to your grocery list. Prepare food yourself at home. What are tips for following this plan? Reading food labels Compare food labels between brand name foods and the store brand. Often the nutritional value is the same, but the store brand is lower cost. Look for products that do not have added sugar, fat, or salt (sodium). These often cost the same but are healthier for you. Products may be labeled as: Sugar-free. Nonfat. Low-fat. Sodium-free. Low-sodium. Look for lean ground beef labeled as at least 92% lean and 8% fat. Shopping  Buy only the items on your grocery list and go only to the areas of the store that have the items on your list. Use coupons only for foods and brands you normally buy. Avoid buying items you wouldn't normally buy simply because they are on sale. Check online and in newspapers for  weekly deals. Buy healthy items from the bulk bins when available, such as herbs, spices, flour, pasta, nuts, and dried fruit. Buy fruits and vegetables that are in season. Prices are usually lower on in-season produce. Look at the unit price on the price tag. Use it to compare different brands and sizes to find out which item is the best deal. Choose healthy items that are often low-cost, such as carrots, potatoes, apples, bananas, and oranges. Dried or canned beans are a low-cost protein source. Buy in bulk and freeze extra food. Items you can buy in bulk include meats, fish, poultry, frozen fruits, and frozen vegetables. Avoid buying "ready-to-eat" foods, such as pre-cut fruits and vegetables and pre-made salads. If possible, shop around to discover where you can find the best prices. Consider other retailers such as dollar stores, larger wholesale stores, local fruit and vegetable stands, and farmers markets. Do not shop when you are hungry. If you shop while hungry, it may be hard to stick to your list and budget. Resist impulse buying. Use your grocery list as your official plan for the week. Buy a variety of vegetables and fruits by purchasing fresh, frozen, and canned items. Look at the top and bottom shelves for deals. Foods at eye level (eye level of an adult or child) are usually more expensive. Be efficient with your time when shopping. The more time you spend at the store, the more money you are likely to spend. To save money when choosing more expensive foods like meats and dairy: Choose cheaper cuts of meat, such as bone-in   chicken thighs and drumsticks instead of skinless and boneless chicken. When you are ready to prepare the chicken, you can remove the skin yourself to make it healthier. Choose lean meats like chicken or turkey instead of beef. Choose canned seafood, such as tuna, salmon, or sardines. Buy eggs as a low-cost source of protein. Buy dried beans and peas, such as  lentils, split peas, or kidney beans instead of meats. Dried beans and peas are a good alternative source of protein. Buy the larger tubs of yogurt instead of individual-sized containers. Choose water instead of sodas and other sweetened beverages. Avoid buying chips, cookies, and other "junk food." These items are usually expensive and not healthy.  Cooking Make extra food and freeze the extras in meal-sized containers or in individual portions for fast meals and snacks. Pre-cook on days when you have extra time to prepare meals in advance. You can keep these meals in the fridge or freezer and reheat for a quick meal. When you come home from the grocery store, wash, peel, and cut fruits and vegetables so they are ready to use and eat. This will help reduce food waste. Meal planning Do not eat out or get fast food. Prepare food at home. Make a grocery list and make sure to bring it with you to the store. If you have a smart phone, you could use your phone to create your shopping list. Plan meals and snacks according to a grocery list and budget you create. Use leftovers in your meal plan for the week. Look for recipes where you can cook once and make enough food for two meals. Prepare budget-friendly types of meals like stews, casseroles, and stir-fry dishes. Try some meatless meals or try "no cook" meals like salads. Make sure that half your plate is filled with fruits or vegetables. Choose from fresh, frozen, or canned fruits and vegetables. If eating canned, remember to rinse them before eating. This will remove any excess salt added for packaging. Summary Eating healthy on a budget is possible if you plan your meals according to your budget, purchase according to your budget and grocery list, and prepare food yourself. Tips for buying more food on a limited budget include buying generic brands, using coupons only for foods you normally buy, and buying healthy items from the bulk bins when  available. Tips for buying cheaper food to replace expensive food include choosing cheaper, lean cuts of meat, and buying dried beans and peas. This information is not intended to replace advice given to you by your health care provider. Make sure you discuss any questions you have with your healthcare provider. Document Revised: 08/20/2020 Document Reviewed: 08/20/2020 Elsevier Patient Education  2022 [employer] Inc. Bone Health Bones protect organs, store calcium, anchor muscles, and support the whole body. Keeping your bones strong is important, especially as you get older. Youcan take actions to help keep your bones strong and healthy. Why is keeping my bones healthy important?  Keeping your bones healthy is important because your body constantly replaces bone cells. Cells get old, and new cells take their place. As we age, we lose bone cells because the body may not be able to make enough new cells to replace the old cells. The amount of bone cells and bone tissue you have is referred toas bone mass. The higher your bone mass, the stronger your bones. The aging process leads to an overall loss of bone mass in the body, which can increase the likelihood of: Joint pain   and stiffness. Broken bones. A condition in which the bones become weak and brittle (osteoporosis). A large decline in bone mass occurs in older adults. In women, it occurs aboutthe time of menopause. What actions can I take to keep my bones healthy? Good health habits are important for maintaining healthy bones. This includes eating nutritious foods and exercising regularly. To have healthy bones, you need to get enough of the right minerals and vitamins. Most nutrition experts recommend getting these nutrients from the foods that you eat. In some cases, taking supplements may also be recommended. Doing certain types of exercise isalso important for bone health. What are the nutritional recommendations for healthy bones?  Eating  a well-balanced diet with plenty of calcium and vitamin D will help to protect your bones. Nutritional recommendations vary from person to person. Ask your health care provider what is healthy for you. Here are some generalguidelines. Get enough calcium Calcium is the most important (essential) mineral for bone health. Most people can get enough calcium from their diet, but supplements may be recommended for people who are at risk for osteoporosis. Good sources of calcium include: Dairy products, such as low-fat or nonfat milk, cheese, and yogurt. Dark green leafy vegetables, such as bok choy and broccoli. Calcium-fortified foods, such as orange juice, cereal, bread, soy beverages, and tofu products. Nuts, such as almonds. Follow these recommended amounts for daily calcium intake: Children, age 1-3: 700 mg. Children, age 4-8: 1,000 mg. Children, age 9-13: 1,300 mg. Teens, age 14-18: 1,300 mg. Adults, age 19-50: 1,000 mg. Adults, age 51-70: Men: 1,000 mg. Women: 1,200 mg. Adults, age 71 or older: 1,200 mg. Pregnant and breastfeeding females: Teens: 1,300 mg. Adults: 1,000 mg. Get enough vitamin D Vitamin D is the most essential vitamin for bone health. It helps the body absorb calcium. Sunlight stimulates the skin to make vitamin D, so be sure to get enough sunlight. If you live in a cold climate or you do not get outside often, your health care provider may recommend that you take vitamin D supplements. Good sources of vitamin D in your diet include: Egg yolks. Saltwater fish. Milk and cereal fortified with vitamin D. Follow these recommended amounts for daily vitamin D intake: Children and teens, age 1-18: 600 international units. Adults, age 50 or younger: 400-800 international units. Adults, age 51 or older: 800-1,000 international units. Get other important nutrients Other nutrients that are important for bone health include: Phosphorus. This mineral is found in meat, poultry,  dairy foods, nuts, and legumes. The recommended daily intake for adult men and adult women is 700 mg. Magnesium. This mineral is found in seeds, nuts, dark green vegetables, and legumes. The recommended daily intake for adult men is 400-420 mg. For adult women, it is 310-320 mg. Vitamin K. This vitamin is found in green leafy vegetables. The recommended daily intake is 120 mg for adult men and 90 mg for adult women. What type of physical activity is best for building and maintaining healthybones? Weight-bearing and strength-building activities are important for building and maintaining healthy bones. Weight-bearing activities cause muscles and bones to work against gravity. Strength-building activities increase the strength of the muscles that support bones. Weight-bearing and muscle-building activities include: Walking and hiking. Jogging and running. Dancing. Gym exercises. Lifting weights. Tennis and racquetball. Climbing stairs. Aerobics. Adults should get at least 30 minutes of moderate physical activity on most days. Children should get at least 60 minutes of moderate physical activity onmost days. Ask your health care   provider what type of exercise is best for you. How can I find out if my bone mass is low? Bone mass can be measured with an X-ray test called a bone mineral density (BMD) test. This test is recommended for all women who are age 65 or older. It may also be recommended for: Men who are age 70 or older. People who are at risk for osteoporosis because of: Having bones that break easily. Having a long-term disease that weakens bones, such as kidney disease or rheumatoid arthritis. Having menopause earlier than normal. Taking medicine that weakens bones, such as steroids, thyroid hormones, or hormone treatment for breast cancer or prostate cancer. Smoking. Drinking three or more alcoholic drinks a day. If you find that you have a low bone mass, you may be able to  preventosteoporosis or further bone loss by changing your diet and lifestyle. Where can I find more information? For more information, check out the following websites: National Osteoporosis Foundation: www.nof.org/patients National Institutes of Health: www.bones.nih.gov International Osteoporosis Foundation: www.iofbonehealth.org Summary The aging process leads to an overall loss of bone mass in the body, which can increase the likelihood of broken bones and osteoporosis. Eating a well-balanced diet with plenty of calcium and vitamin D will help to protect your bones. Weight-bearing and strength-building activities are also important for building and maintaining strong bones. Bone mass can be measured with an X-ray test called a bone mineral density (BMD) test. This information is not intended to replace advice given to you by your health care provider. Make sure you discuss any questions you have with your healthcare provider. Document Revised: 12/04/2017 Document Reviewed: 12/04/2017 Elsevier Patient Education  2022 [employer] Inc. Iron-Rich Diet  Iron is a mineral that helps your body produce hemoglobin. Hemoglobin is a protein in red blood cells that carries oxygen to your body's tissues. Eating too little iron may cause you to feel weak and tired, and it can increase your risk of infection. Iron is naturally found in many foods, and many foods have iron added to them (are iron-fortified). You may need to follow an iron-rich diet if you do not have enough iron in your body due to certain medical conditions. The amount of iron that you need each day depends on your age, your sex, and any medical conditions you have. Follow instructions from your health care provider or a dietitian about how much ironyou should eat each day. What are tips for following this plan? Reading food labels Check food labels to see how many milligrams (mg) of iron are in each serving. Cooking Cook foods in pots and  pans that are made from iron. Take these steps to make it easier for your body to absorb iron from certain foods: Soak beans overnight before cooking. Soak whole grains overnight and drain them before using. Ferment flours before baking, such as by using yeast in bread dough. Meal planning When you eat foods that contain iron, you should eat them with foods that are high in vitamin C. These include oranges, peppers, tomatoes, potatoes, and mangoes. Vitamin C helps your body absorb iron. Certain foods and drinks prevent your body from absorbing iron properly. Avoid eating these foods in the same meal as iron-rich foods or with iron supplements. These foods include: Coffee, black tea, and red wine. Milk, dairy products, and foods that are high in calcium. Beans and soybeans. Whole grains. General information Take iron supplements only as told by your health care provider. An overdose of iron can   be life-threatening. If you were prescribed iron supplements, take them with orange juice or a vitamin C supplement. When you eat iron-fortified foods or take an iron supplement, you should also eat foods that naturally contain iron, such as meat, poultry, and fish. Eating naturally iron-rich foods helps your body absorb the iron that is added to other foods or contained in a supplement. Iron from animal sources is better absorbed than iron from plant sources. What foods should I eat? Fruits Prunes. Raisins. Eat fruits high in vitamin C, such as oranges, grapefruits, and strawberries,with iron-rich foods. Vegetables Spinach (cooked). Green peas. Broccoli. Fermented vegetables. Eat vegetables high in vitamin C, such as leafy greens, potatoes, bell peppers,and tomatoes, with iron-rich foods. Grains Iron-fortified breakfast cereal. Iron-fortified whole-wheat bread. Enrichedrice. Sprouted grains. Meats and other proteins Beef liver. Beef. Turkey. Chicken. Oysters. Shrimp. Tuna. Sardines. Chickpeas.Nuts.  Tofu. Pumpkin seeds. Beverages Tomato juice. Fresh orange juice. Prune juice. Hibiscus tea. Iron-fortifiedinstant breakfast shakes. Sweets and desserts Blackstrap molasses. Seasonings and condiments Tahini. Fermented soy sauce. Other foods Wheat germ. The items listed above may not be a complete list of recommended foods and beverages. Contact a dietitian for more information. What foods should I limit? These are foods that should be limited while eating iron-rich foods as they canreduce the absorption of iron in your body. Grains Whole grains. Bran cereal. Bran flour. Meats and other proteins Soybeans. Products made from soy protein. Black beans. Lentils. Mung beans.Split peas. Dairy Milk. Cream. Cheese. Yogurt. Cottage cheese. Beverages Coffee. Black tea. Red wine. Sweets and desserts Cocoa. Chocolate. Ice cream. Seasonings and condiments Basil. Oregano. Large amounts of parsley. The items listed above may not be a complete list of foods and beverages you should limit. Contact a dietitian for more information. Summary Iron is a mineral that helps your body produce hemoglobin. Hemoglobin is a protein in red blood cells that carries oxygen to your body's tissues. Iron is naturally found in many foods, and many foods have iron added to them (are iron-fortified). When you eat foods that contain iron, you should eat them with foods that are high in vitamin C. Vitamin C helps your body absorb iron. Certain foods and drinks prevent your body from absorbing iron properly, such as whole grains and dairy products. You should avoid eating these foods in the same meal as iron-rich foods or with iron supplements. This information is not intended to replace advice given to you by your health care provider. Make sure you discuss any questions you have with your healthcare provider. Document Revised: 10/19/2020 Document Reviewed: 10/19/2020 Elsevier Patient Education  2022 [employer] Inc.  

## 2021-06-03 NOTE — Progress Notes (Signed)
Gynecology Annual Exam  PCP: Derinda Late, MD  Chief Complaint:  Chief Complaint  Patient presents with   Gynecologic Exam    History of Present Illness: Patient is a 50 y.o. No obstetric history on file. presents for annual exam.  She reports that she last had a menstrual cycle in November 2021.  She believes that she is going through perimenopause.  She reports that since 2016 she has been having increasing weight gain.  She also notes that she is experiencing bothersome hot flashes, sleep disturbances, worsening anxiety, and irritability.  Feels like she is "in hell."  She is very uncomfortable in her close no longer fit.  LMP: No LMP recorded. Patient is perimenopausal.   The patient is sexually active. She denies dyspareunia.    There is no notable family history of breast or ovarian cancer in her family.  The patient has regular exercise: Yes, she works out with a trainer 2 times a week and walks for 30 minutes 3 times a week.  She generally exercises 5 days a week.   Review of Systems: Review of Systems  Constitutional:  Negative for chills, fever, malaise/fatigue and weight loss.       + weight gain  HENT:  Negative for congestion, hearing loss and sinus pain.   Eyes:  Negative for blurred vision and double vision.  Respiratory:  Negative for cough, sputum production, shortness of breath and wheezing.   Cardiovascular:  Negative for chest pain, palpitations, orthopnea and leg swelling.  Gastrointestinal:  Negative for abdominal pain, constipation, diarrhea, nausea and vomiting.  Genitourinary:  Negative for dysuria, flank pain, frequency, hematuria and urgency.  Musculoskeletal:  Positive for joint pain. Negative for back pain and falls.  Skin:  Negative for itching and rash.  Neurological:  Negative for dizziness and headaches.  Psychiatric/Behavioral:  Negative for depression, substance abuse and suicidal ideas. The patient is nervous/anxious.    Past Medical  History:  Past Medical History:  Diagnosis Date   Anemia    "as a child"   Anxiety    Bursitis    Childhood asthma    Chronic lower back pain    Coronary artery disease    a. 09/2016: cath showing 95% stenosis of the mid-LAD. Xience 2.5x51mm DES placed, remaining left and right coronary tree angiographically normal, lip swelling noted at the end of the case without rash or respiratory distress, treated with IV Solu-Medrol and Benadryl.   History of nuclear stress test    a. 07/2016: no sig ischemia, nl wall motion, EF 72%, small defect along mid to apical anteroseptal wall c/w breast attenuation artifact, low risk study   Hypertension    Migraine    "it was a bout; beginning of 2017; for ~ 1 month; went away" (05/23/2018)   Mitral regurgitation    a. echo 08/2016: vigorous EF 65-70%, nl wall motion, nl LV diastolic fxn, mild MR   Tendonitis    Urticaria     Past Surgical History:  Past Surgical History:  Procedure Laterality Date   CARDIAC CATHETERIZATION N/A 10/05/2016   Procedure: Left Heart Cath and Coronary Angiography;  Surgeon: Wellington Hampshire, MD;  Location: [facility];  Service: Cardiovascular;  Laterality: N/A;   CARDIAC CATHETERIZATION N/A 10/05/2016   Procedure: Coronary Stent Intervention;  Surgeon: Wellington Hampshire, MD;  Location: [facility];  Service: Cardiovascular;  Laterality: N/A;   CESAREAN SECTION  2003; 2005   CORONARY ANGIOPLASTY WITH STENT PLACEMENT  05/23/2018   "2 stents"   CORONARY ARTERY BYPASS GRAFT N/A 08/27/2018   Procedure: CORONARY ARTERY BYPASS GRAFTING (CABG) x 1 using LEFT INTERNAL MAMMARY ARTERY;  Surgeon: Gaye Pollack, MD;  Location: [facility];  Service: Open Heart Surgery;  Laterality: N/A;   CORONARY STENT INTERVENTION N/A 05/23/2018   Procedure: CORONARY STENT INTERVENTION;  Surgeon: Wellington Hampshire, MD;  Location: [facility];  Service: Cardiovascular;  Laterality: N/A;  mid LAD   ECTOPIC PREGNANCY SURGERY  1991   LEFT  HEART CATH AND CORONARY ANGIOGRAPHY N/A 05/23/2018   Procedure: LEFT HEART CATH AND CORONARY ANGIOGRAPHY;  Surgeon: Wellington Hampshire, MD;  Location: [facility];  Service: Cardiovascular;  Laterality: N/A;   LEFT HEART CATH AND CORONARY ANGIOGRAPHY N/A 08/22/2018   Procedure: LEFT HEART CATH AND CORONARY ANGIOGRAPHY;  Surgeon: Wellington Hampshire, MD;  Location: [facility];  Service: Cardiovascular;  Laterality: N/A;   LEFT HEART CATH AND CORS/GRAFTS ANGIOGRAPHY Left 05/20/2019   Procedure: LEFT HEART CATH AND CORS/GRAFTS ANGIOGRAPHY;  Surgeon: Wellington Hampshire, MD;  Location: [facility];  Service: Cardiovascular;  Laterality: Left;   TEE WITHOUT CARDIOVERSION N/A 08/27/2018   Procedure: TRANSESOPHAGEAL ECHOCARDIOGRAM (TEE);  Surgeon: Gaye Pollack, MD;  Location: [facility];  Service: Open Heart Surgery;  Laterality: N/A;    Gynecologic History:  No LMP recorded. Patient is perimenopausal. Last Pap: unknown Last mammogram: 3 years ago per patient  Obstetric History: No obstetric history on file.  Family History:  Family History  Problem Relation Age of Onset   Heart disease Mother    Parkinson's disease Father     Social History:  Social History   Socioeconomic History   Marital status: Married    Spouse name: Not on file   Number of children: Not on file   Years of education: Not on file   Highest education level: Not on file  Occupational History   Not on file  Tobacco Use   Smoking status: Former    Packs/day: 0.75    Years: 15.00    Pack years: 11.25    Types: Cigarettes    Quit date: 2007    Years since quitting: 15.5   Smokeless tobacco: Never  Vaping Use   Vaping Use: Never used  Substance and Sexual Activity   Alcohol use: Yes    Comment: occ   Drug use: Never   Sexual activity: Yes  Other Topics Concern   Not on file  Social History Narrative   Not on file   Social Determinants of Health   Financial Resource Strain: Not on file   Food Insecurity: Not on file  Transportation Needs: Not on file  Physical Activity: Not on file  Stress: Not on file  Social Connections: Not on file  Intimate Partner Violence: Not on file    Allergies:  Allergies  Allergen Reactions   Codeine Nausea And Vomiting   Contrast Media [Iodinated Diagnostic Agents] Swelling    The patient had mild lip swelling at the end of the case without rash or respiratory distress. This might be due to dye reaction. 2017    Medications: Prior to Admission medications   Medication Sig Start Date End Date Taking? Authorizing Provider  amLODipine (NORVASC) 5 MG tablet TAKE 1 TABLET(5 MG) BY MOUTH DAILY 05/07/21  Yes Wellington Hampshire, MD  aspirin 325 MG EC tablet Take 1 tablet (325 mg total) by mouth daily. 12/28/18  Yes Arida,  Mertie Clause, MD  losartan (COZAAR) 100 MG tablet TAKE 1 TABLET(100 MG) BY MOUTH DAILY 06/26/20  Yes Wellington Hampshire, MD  Multiple Vitamins-Minerals (MULTIVITAMIN GUMMIES ADULT PO) Take 1 Units by mouth daily.   Yes [provider]  nitrofurantoin (MACRODANTIN) 25 MG capsule Take 25 mg by mouth daily as needed (for UTI prevention).  07/18/16  Yes [provider]  nitroGLYCERIN (NITROSTAT) 0.4 MG SL tablet Place 1 tablet (0.4 mg total) under the tongue every 5 (five) minutes as needed for chest pain (Do not tak more than 3 tablets in any 24 hour period). 05/11/19  Yes Harvest Dark, MD  rosuvastatin (CRESTOR) 10 MG tablet Take 1 tablet (10 mg total) by mouth daily. 12/25/20  Yes Wellington Hampshire, MD  Saccharomyces boulardii (PROBIOTIC) 250 MG CAPS Take 1 capsule by mouth daily.   Yes [provider]  hydrochlorothiazide (MICROZIDE) 12.5 MG capsule Take 1 capsule (12.5 mg total) by mouth daily. 12/25/20 03/25/21  Wellington Hampshire, MD  Omega-3 Fatty Acids (FISH OIL) 1000 MG CAPS Take by mouth. Patient taking liquid fish oil. 1 tsp twice daily Patient not taking: Reported on 06/03/2021    [provider]     Physical Exam Vitals: Blood pressure 130/70, height 5' 6.5" (1.689 m), weight 170 lb 6.4 oz (77.3 kg).  Physical Exam Constitutional:      Appearance: She is well-developed.  Genitourinary:     Genitourinary Comments: External: Normal appearing vulva. No lesions noted.  Speculum examination: Normal appearing cervix. No blood in the vaginal vault. No discharge.  Bimanual examination: Uterus midline, non-tender, normal in size, shape and contour.  No CMT. No adnexal masses. No adnexal tenderness. Pelvis not fixed.  Breast Exam: breast equal without skin changes, nipple discharge, breast lump or enlarged lymph nodes and abnormal finding:  bilateral axilla enlargement suggestive of lipoma   HENT:     Head: Normocephalic and atraumatic.  Neck:     Thyroid: No thyromegaly.  Cardiovascular:     Rate and Rhythm: Normal rate and regular rhythm.     Heart sounds: Normal heart sounds.  Pulmonary:     Effort: Pulmonary effort is normal.     Breath sounds: Normal breath sounds.  Abdominal:     General: Bowel sounds are normal. There is no distension.     Palpations: Abdomen is soft. There is no mass.  Musculoskeletal:     Cervical back: Neck supple.  Neurological:     Mental Status: She is alert and oriented to person, place, and time.  Skin:    General: Skin is warm and dry.  Psychiatric:        Behavior: Behavior normal.        Thought Content: Thought content normal.        Judgment: Judgment normal.  Vitals reviewed.     Female chaperone present for pelvic and breast  portions of the physical exam  Assessment: 50 y.o. No obstetric history on file. routine annual exam  Plan: Problem List Items Addressed This Visit   None Visit Diagnoses     Encounter for annual routine gynecological examination    -  Primary   Health maintenance examination       Cervical cancer screening       Relevant Orders   Cytology - PAP   Recurrent UTI       Relevant Medications    nitrofurantoin (MACRODANTIN) 25 MG capsule   Lipoma of left upper extremity  Relevant Orders   [place] AXILLA LEFT   Lipoma of right upper extremity       Relevant Orders   [place] AXILLA RIGHT       1) Mammogram - recommend yearly screening mammogram.  Mammogram Was ordered today  2) STI screening was offered and declined  3) ASCCP guidelines and rational discussed.  Pap today  4) Colonoscopy -discussed, plans to follow up with her PCP regarding this  5) Routine healthcare maintenance including cholesterol, diabetes screening discussed managed by PCP  6) Bilateral axillar enlargement suggestive of lipoma ultrasound ordered.  7) Discussed perimenopause//menopause and treatment options.  Given patient's significant history of heart cardiac disease she is not a candidate unfortunately for hormone replacement therapy.  Discussed alternative options such as clonidine gabapenti and Paxil.  Provide the patient with handouts and information.   Adrian Prows MD, [facility], [facility] 06/04/2021 5:56 PM

## 2021-06-08 LAB — CYTOLOGY - PAP
Comment: NEGATIVE
Diagnosis: NEGATIVE
High risk HPV: NEGATIVE

## 2021-06-11 ENCOUNTER — Other Ambulatory Visit: Payer: Self-pay | Admitting: Obstetrics and Gynecology

## 2021-06-11 DIAGNOSIS — Z1231 Encounter for screening mammogram for malignant neoplasm of breast: Secondary | ICD-10-CM

## 2021-06-11 DIAGNOSIS — D1721 Benign lipomatous neoplasm of skin and subcutaneous tissue of right arm: Secondary | ICD-10-CM

## 2021-06-11 DIAGNOSIS — D1722 Benign lipomatous neoplasm of skin and subcutaneous tissue of left arm: Secondary | ICD-10-CM

## 2021-06-11 NOTE — Progress Notes (Signed)
Mammogram orders placed.

## 2021-06-15 ENCOUNTER — Other Ambulatory Visit: Payer: Self-pay | Admitting: Obstetrics and Gynecology

## 2021-06-15 DIAGNOSIS — N6310 Unspecified lump in the right breast, unspecified quadrant: Secondary | ICD-10-CM

## 2021-06-15 DIAGNOSIS — Z1231 Encounter for screening mammogram for malignant neoplasm of breast: Secondary | ICD-10-CM

## 2021-06-15 DIAGNOSIS — N6320 Unspecified lump in the left breast, unspecified quadrant: Secondary | ICD-10-CM

## 2021-06-29 ENCOUNTER — Encounter: Payer: Self-pay | Admitting: Cardiovascular Disease

## 2021-06-29 ENCOUNTER — Ambulatory Visit (INDEPENDENT_AMBULATORY_CARE_PROVIDER_SITE_OTHER): Admitting: Cardiovascular Disease

## 2021-06-29 ENCOUNTER — Other Ambulatory Visit: Payer: Self-pay

## 2021-06-29 ENCOUNTER — Telehealth: Payer: Self-pay

## 2021-06-29 VITALS — BP 146/80 | HR 57 | Ht 66.5 in | Wt 170.0 lb

## 2021-06-29 DIAGNOSIS — I2510 Atherosclerotic heart disease of native coronary artery without angina pectoris: Secondary | ICD-10-CM

## 2021-06-29 DIAGNOSIS — E785 Hyperlipidemia, unspecified: Secondary | ICD-10-CM | POA: Diagnosis not present

## 2021-06-29 DIAGNOSIS — I10 Essential (primary) hypertension: Secondary | ICD-10-CM | POA: Diagnosis not present

## 2021-06-29 DIAGNOSIS — Z951 Presence of aortocoronary bypass graft: Secondary | ICD-10-CM

## 2021-06-29 NOTE — Patient Instructions (Signed)

## 2021-06-29 NOTE — Progress Notes (Signed)
Cardiology Office Note   Date:  06/29/2021   ID:  Kim Villarreal, DOB [birth year], MRN [id number]  PCP:  Derinda Late, MD  Cardiologist:   [name], MD   Chief Complaint  Patient presents with   Follow-up    6 Months follow up and medications verbally reviewed with patient.        History of Present Illness: Kim Villarreal is a 50 y.o. female who presents for a follow-up visit regarding coronary artery disease.  She is a previous smoker . She has no diabetes or hyperlipidemia.  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  She had recurrent angina in June. 2020 in the setting of significantly elevated blood pressure with exercise.  She underwent cardiac catheterization on June 29 which showed occluded proximal LAD with patent LIMA to LAD with moderate anastomosis lesion estimated to be 40%.  EF was normal with mildly elevated left ventricular end-diastolic pressure . She had COVID-19 infection in November and most recently in May.  She has been doing reasonably well from a cardiac standpoint with no recurrent chest pain or worsening dyspnea. She is mostly stressed about going through menopause and continued weight gain.   Past Medical History:  Diagnosis Date   Anemia    "as a child"   Anxiety    Bursitis    Childhood asthma    Chronic lower back pain    Coronary artery disease    a. 09/2016: cath showing 95% stenosis of the mid-LAD. Xience 2.5x31m DES placed, remaining left and right coronary tree angiographically normal, lip swelling noted at the end of the case without rash or respiratory distress, treated with IV Solu-Medrol and Benadryl.   History of nuclear stress test    a. 07/2016: no sig ischemia, nl wall motion, EF 72%, small defect along mid to apical anteroseptal wall c/w breast attenuation artifact, low risk study   Hypertension    Migraine    "it was a bout; beginning of 2017; for ~ 1  month; went away" (05/23/2018)   Mitral regurgitation    a. echo 08/2016: vigorous EF 65-70%, nl wall motion, nl LV diastolic fxn, mild MR   Tendonitis    Urticaria     Past Surgical History:  Procedure Laterality Date   CARDIAC CATHETERIZATION N/A 10/05/2016   Procedure: Left Heart Cath and Coronary Angiography;  Surgeon: MWellington Hampshire MD;  [facility];  Service: Cardiovascular;  Laterality: N/A;   CARDIAC CATHETERIZATION N/A 10/05/2016   Procedure: Coronary Stent Intervention;  Surgeon: MWellington Hampshire MD;  [facility];  Service: Cardiovascular;  Laterality: N/A;   CESAREAN SECTION  2003; 2005   CORONARY ANGIOPLASTY WITH STENT PLACEMENT  05/23/2018   "2 stents"   CORONARY ARTERY BYPASS GRAFT N/A 08/27/2018   Procedure: CORONARY ARTERY BYPASS GRAFTING (CABG) x 1 using LEFT INTERNAL MAMMARY ARTERY;  Surgeon: BGaye Pollack MD;  [place] [facility]  Service: Open Heart Surgery;  Laterality: N/A;   CORONARY STENT INTERVENTION N/A 05/23/2018   Procedure: CORONARY STENT INTERVENTION;  Surgeon: AWellington Hampshire MD;  [facility];  Service: Cardiovascular;  Laterality: N/A;  mid LAD   ECTOPIC PREGNANCY SURGERY  1991   LEFT HEART CATH AND CORONARY ANGIOGRAPHY N/A 05/23/2018   Procedure: LEFT HEART CATH AND CORONARY ANGIOGRAPHY;  Surgeon: AWellington Hampshire MD;  [facility];  Service:  Cardiovascular;  Laterality: N/A;   LEFT HEART CATH AND CORONARY ANGIOGRAPHY N/A 08/22/2018   Procedure: LEFT HEART CATH AND CORONARY ANGIOGRAPHY;  Surgeon: Wellington Hampshire, MD;  Location: [facility];  Service: Cardiovascular;  Laterality: N/A;   LEFT HEART CATH AND CORS/GRAFTS ANGIOGRAPHY Left 05/20/2019   Procedure: LEFT HEART CATH AND CORS/GRAFTS ANGIOGRAPHY;  Surgeon: Wellington Hampshire, MD;  Location: [facility] [facility];  Service: Cardiovascular;  Laterality: Left;   TEE WITHOUT CARDIOVERSION N/A 08/27/2018   Procedure: TRANSESOPHAGEAL  ECHOCARDIOGRAM (TEE);  Surgeon: Gaye Pollack, MD;  Location: [facility] [facility];  Service: Open Heart Surgery;  Laterality: N/A;     Current Outpatient Medications  Medication Sig Dispense Refill   amLODipine (NORVASC) 5 MG tablet TAKE 1 TABLET(5 MG) BY MOUTH DAILY 30 tablet 3   aspirin 325 MG EC tablet Take 1 tablet (325 mg total) by mouth daily.     losartan (COZAAR) 100 MG tablet TAKE 1 TABLET(100 MG) BY MOUTH DAILY 90 tablet 3   Multiple Vitamins-Minerals (MULTIVITAMIN GUMMIES ADULT PO) Take 1 Units by mouth daily.     nitrofurantoin (MACRODANTIN) 25 MG capsule Take 1 capsule (25 mg total) by mouth daily as needed (for UTI prevention). 30 capsule 6   nitroGLYCERIN (NITROSTAT) 0.4 MG SL tablet Place 1 tablet (0.4 mg total) under the tongue every 5 (five) minutes as needed for chest pain (Do not tak more than 3 tablets in any 24 hour period). 30 tablet 0   Omega-3 Fatty Acids (FISH OIL) 1000 MG CAPS Take by mouth. Patient taking liquid fish oil. 1 tsp twice daily     rosuvastatin (CRESTOR) 10 MG tablet Take 1 tablet (10 mg total) by mouth daily. 90 tablet 2   Saccharomyces boulardii (PROBIOTIC) 250 MG CAPS Take 1 capsule by mouth daily.     UNABLE TO FIND CBD Oil and Sleep Gummies     ALPRAZolam (XANAX) 0.25 MG tablet Take 0.25 mg by mouth daily as needed. (Patient not taking: Reported on 06/29/2021)     hydrochlorothiazide (MICROZIDE) 12.5 MG capsule Take 1 capsule (12.5 mg total) by mouth daily. 90 capsule 2   No current facility-administered medications for this visit.    Allergies:   Codeine and Contrast media [iodinated diagnostic agents]    Social History:  The patient  reports that she quit smoking about 15 years ago. Her smoking use included cigarettes. She has a 11.25 pack-year smoking history. She has never used smokeless tobacco. She reports current alcohol use. She reports that she does not use drugs.   Family History:  The patient's family history includes Heart disease in her mother;  Parkinson's disease in her father.    ROS:  Please see the history of present illness.   Otherwise, review of systems are positive for none.   All other systems are reviewed and negative.    PHYSICAL EXAM: VS:  BP (!) 146/80 (BP Location: Left Arm, Patient Position: Sitting, Cuff Size: Normal)   Pulse (!) 57   Ht 5' 6.5" (1.689 m)   Wt 170 lb (77.1 kg)   SpO2 98%   BMI 27.03 kg/m  , BMI Body mass index is 27.03 kg/m. GEN: Well nourished, well developed, in no acute distress  HEENT: normal  Neck: no JVD, carotid bruits, or masses Cardiac: RRR; no  rubs, or gallops,no edema . 1/6 systolic ejection murmur in the aortic area. Respiratory:  clear to auscultation bilaterally, normal work of breathing GI: soft, nontender, nondistended, +  BS MS: no deformity or atrophy  Skin: warm and dry, no rash Neuro:  Strength and sensation are intact Psych: euthymic mood, full affect Femoral pulses diminished bilaterally.  EKG:  EKG is  ordered today. EKG showed sinus bradycardia with minimal LVH.    Recent Labs: No results found for requested labs within last 8760 hours.    Lipid Panel    Component Value Date/Time   CHOL 138 11/08/2019 1452   TRIG 98 11/08/2019 1452   HDL 56 11/08/2019 1452   CHOLHDL 2.5 11/08/2019 1452   CHOLHDL 2.8 03/09/2017 0855   VLDL 11 03/09/2017 0855   LDLCALC 64 11/08/2019 1452   LDLDIRECT 61 09/17/2018 1051      Wt Readings from Last 3 Encounters:  06/29/21 170 lb (77.1 kg)  06/03/21 170 lb 6.4 oz (77.3 kg)  12/25/20 165 lb (74.8 kg)      PAD Screen 08/05/2016  Previous PAD dx? No  Previous surgical procedure? No  Pain with walking? No  Feet/toe relief with dangling? No  Painful, non-healing ulcers? No  Extremities discolored? No      ASSESSMENT AND PLAN:  1.  Coronary artery disease involving native coronary arteries without angina: She is status post one-vessel CABG with LIMA to LAD in October 2019.   No convincing anginal symptoms.   Continue medical therapy.  2. Hyperlipidemia: I increase the dose of rosuvastatin to 10 mg daily during last visit.  She will require a follow-up lipid and liver profile in the near future.  3. Essential hypertension: Blood pressure is reasonably controlled.  I did prescribe small dose hydrochlorothiazide during last visit but she has not started the medication as she does not want to be on too many medications.    Disposition:   FU with me in 6 months.  Signed,  [name], MD  06/29/2021 4:02 PM    [facility]

## 2021-07-10 ENCOUNTER — Other Ambulatory Visit: Payer: Self-pay | Admitting: Cardiovascular Disease

## 2021-07-15 ENCOUNTER — Other Ambulatory Visit: Payer: Self-pay | Admitting: Cardiovascular Disease

## 2021-09-09 ENCOUNTER — Other Ambulatory Visit: Payer: Self-pay | Admitting: Cardiovascular Disease

## 2021-10-01 ENCOUNTER — Other Ambulatory Visit

## 2021-10-01 ENCOUNTER — Inpatient Hospital Stay: Admission: RE | Admit: 2021-10-01 | Source: Ambulatory Visit

## 2021-10-07 NOTE — Telephone Encounter (Signed)
error 

## 2021-10-19 ENCOUNTER — Ambulatory Visit
Admission: RE | Admit: 2021-10-19 | Discharge: 2021-10-19 | Disposition: A | Discharge status: Home / Self Care | Source: Ambulatory Visit | Attending: Obstetrics and Gynecology | Admitting: Obstetrics and Gynecology

## 2021-10-19 ENCOUNTER — Other Ambulatory Visit: Payer: Self-pay

## 2021-10-19 DIAGNOSIS — N6320 Unspecified lump in the left breast, unspecified quadrant: Secondary | ICD-10-CM | POA: Insufficient documentation

## 2021-10-19 DIAGNOSIS — D1722 Benign lipomatous neoplasm of skin and subcutaneous tissue of left arm: Secondary | ICD-10-CM | POA: Diagnosis present

## 2021-10-19 DIAGNOSIS — D1721 Benign lipomatous neoplasm of skin and subcutaneous tissue of right arm: Secondary | ICD-10-CM | POA: Diagnosis present

## 2021-10-19 DIAGNOSIS — Z1231 Encounter for screening mammogram for malignant neoplasm of breast: Secondary | ICD-10-CM | POA: Diagnosis present

## 2021-10-19 DIAGNOSIS — N6310 Unspecified lump in the right breast, unspecified quadrant: Secondary | ICD-10-CM | POA: Insufficient documentation

## 2021-11-04 ENCOUNTER — Other Ambulatory Visit: Payer: Self-pay | Admitting: Cardiovascular Disease

## 2022-01-04 ENCOUNTER — Encounter: Payer: Self-pay | Admitting: Cardiovascular Disease

## 2022-01-04 ENCOUNTER — Other Ambulatory Visit: Payer: Self-pay

## 2022-01-04 ENCOUNTER — Ambulatory Visit (INDEPENDENT_AMBULATORY_CARE_PROVIDER_SITE_OTHER): Admitting: Cardiovascular Disease

## 2022-01-04 VITALS — BP 148/70 | HR 59 | Ht 66.5 in | Wt 171.0 lb

## 2022-01-04 DIAGNOSIS — I10 Essential (primary) hypertension: Secondary | ICD-10-CM

## 2022-01-04 DIAGNOSIS — E785 Hyperlipidemia, unspecified: Secondary | ICD-10-CM | POA: Diagnosis not present

## 2022-01-04 DIAGNOSIS — I2510 Atherosclerotic heart disease of native coronary artery without angina pectoris: Secondary | ICD-10-CM | POA: Diagnosis not present

## 2022-01-04 NOTE — Progress Notes (Signed)
Cardiology Office Note   Date:  01/04/2022   ID:  Kim Villarreal, DOB Dec 18, 1970, MRN [id number]  PCP:  Derinda Late, MD  Cardiologist:   [name], MD   Chief Complaint  Patient presents with   Other    6 Month f/u no complaints today. Meds reviewed verbally with pt.       History of Present Illness: Kim Villarreal is a 51 y.o. female who presents for a follow-up visit regarding coronary artery disease.  She is a previous smoker . She has no diabetes or hyperlipidemia.  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  She had recurrent angina in June. 2020 in the setting of significantly elevated blood pressure with exercise.  She underwent cardiac catheterization on June 29 which showed occluded proximal LAD with patent LIMA to LAD with moderate anastomosis lesion estimated to be 40%.  EF was normal with mildly elevated left ventricular end-diastolic pressure .  She continues to struggle with menopause symptoms and has been treated at specialty clinic in [place].  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  She has no exertional chest pain although she continues to have random intermittent chest pain at rest lasting for few seconds.  Past Medical History:  Diagnosis Date   Anemia    "as a child"   Anxiety    Bursitis    Childhood asthma    Chronic lower back pain    Coronary artery disease    a. 09/2016: cath showing 95% stenosis of the mid-LAD. Xience 2.5x43mm DES placed, remaining left and right coronary tree angiographically normal, lip swelling noted at the end of the case without rash or respiratory distress, treated with IV Solu-Medrol and Benadryl.   History of nuclear stress test    a. 07/2016: no sig ischemia, nl wall motion, EF 72%, small defect along mid to apical anteroseptal wall c/w breast attenuation  artifact, low risk study   Hypertension    Migraine    "it was a bout; beginning of 2017; for ~ 1 month; went away" (05/23/2018)   Mitral regurgitation    a. echo 08/2016: vigorous EF 65-70%, nl wall motion, nl LV diastolic fxn, mild MR   Tendonitis    Urticaria     Past Surgical History:  Procedure Laterality Date   CARDIAC CATHETERIZATION N/A 10/05/2016   Procedure: Left Heart Cath and Coronary Angiography;  Surgeon: Wellington Hampshire, MD;  Location: [facility];  Service: Cardiovascular;  Laterality: N/A;   CARDIAC CATHETERIZATION N/A 10/05/2016   Procedure: Coronary Stent Intervention;  Surgeon: Wellington Hampshire, MD;  Location: [facility];  Service: Cardiovascular;  Laterality: N/A;   CESAREAN SECTION  2003; 2005   CORONARY ANGIOPLASTY WITH STENT PLACEMENT  05/23/2018   "2 stents"   CORONARY ARTERY BYPASS GRAFT N/A 08/27/2018   Procedure: CORONARY ARTERY BYPASS GRAFTING (CABG) x 1 using LEFT INTERNAL MAMMARY ARTERY;  Surgeon: Gaye Pollack, MD;  Location: [facility];  Service: Open Heart Surgery;  Laterality: N/A;   CORONARY STENT INTERVENTION N/A 05/23/2018   Procedure: CORONARY STENT INTERVENTION;  Surgeon: Wellington Hampshire, MD;  Location: [facility];  Service: Cardiovascular;  Laterality: N/A;  mid LAD   ECTOPIC PREGNANCY SURGERY  1991   LEFT HEART CATH AND CORONARY ANGIOGRAPHY N/A 05/23/2018   Procedure: LEFT HEART CATH AND  CORONARY ANGIOGRAPHY;  Surgeon: Wellington Hampshire, MD;  Location: [facility];  Service: Cardiovascular;  Laterality: N/A;   LEFT HEART CATH AND CORONARY ANGIOGRAPHY N/A 08/22/2018   Procedure: LEFT HEART CATH AND CORONARY ANGIOGRAPHY;  Surgeon: Wellington Hampshire, MD;  Location: [facility];  Service: Cardiovascular;  Laterality: N/A;   LEFT HEART CATH AND CORS/GRAFTS ANGIOGRAPHY Left 05/20/2019   Procedure: LEFT HEART CATH AND CORS/GRAFTS ANGIOGRAPHY;  Surgeon: Wellington Hampshire, MD;  Location: [facility];  Service:  Cardiovascular;  Laterality: Left;   TEE WITHOUT CARDIOVERSION N/A 08/27/2018   Procedure: TRANSESOPHAGEAL ECHOCARDIOGRAM (TEE);  Surgeon: Gaye Pollack, MD;  Location: [facility];  Service: Open Heart Surgery;  Laterality: N/A;     Current Outpatient Medications  Medication Sig Dispense Refill   ALPRAZolam (XANAX) 0.25 MG tablet Take 0.25 mg by mouth daily as needed.     amLODipine (NORVASC) 5 MG tablet TAKE 1 TABLET(5 MG) BY MOUTH DAILY 30 tablet 3   aspirin 325 MG EC tablet Take 1 tablet (325 mg total) by mouth daily.     estradiol (VIVELLE-DOT) 0.025 MG/24HR Place 1 patch onto the skin every 3 (three) days.     losartan (COZAAR) 100 MG tablet TAKE 1 TABLET(100 MG) BY MOUTH DAILY 90 tablet 0   Multiple Vitamins-Minerals (MULTIVITAMIN GUMMIES ADULT PO) Take 1 Units by mouth daily.     nitrofurantoin (MACRODANTIN) 25 MG capsule Take 1 capsule (25 mg total) by mouth daily as needed (for UTI prevention). 30 capsule 6   nitroGLYCERIN (NITROSTAT) 0.4 MG SL tablet Place 1 tablet (0.4 mg total) under the tongue every 5 (five) minutes as needed for chest pain (Do not tak more than 3 tablets in any 24 hour period). 30 tablet 0   NON FORMULARY Dim Pro 2 capsules daily     NON FORMULARY Testosterone micronized powder .25 mg qhs.     progesterone (PROMETRIUM) 100 MG capsule Take 100 mg by mouth daily.     rosuvastatin (CRESTOR) 10 MG tablet TAKE 1 TABLET(10 MG) BY MOUTH DAILY 90 tablet 2   Saccharomyces boulardii (PROBIOTIC) 250 MG CAPS Take 1 capsule by mouth daily.     No current facility-administered medications for this visit.    Allergies:   Codeine and Contrast media [iodinated contrast media]    Social History:  The patient  reports that she quit smoking about 16 years ago. Her smoking use included cigarettes. She has a 11.25 pack-year smoking history. She has never used smokeless tobacco. She reports current alcohol use. She reports that she does not use drugs.   Family History:  The  patient's family history includes Heart disease in her mother; Parkinson's disease in her father.    ROS:  Please see the history of present illness.   Otherwise, review of systems are positive for none.   All other systems are reviewed and negative.    PHYSICAL EXAM: VS:  BP (!) 148/70 (BP Location: Left Arm, Patient Position: Sitting, Cuff Size: Normal) Comment: after ekg   Pulse (!) 59    Ht 5' 6.5" (1.689 m)    Wt 171 lb (77.6 kg)    SpO2 98%    BMI 27.19 kg/m  , BMI Body mass index is 27.19 kg/m. GEN: Well nourished, well developed, in no acute distress  HEENT: normal  Neck: no JVD, carotid bruits, or masses Cardiac: RRR; no  rubs, or gallops,no edema . 1/6 systolic ejection murmur in the aortic  area. Respiratory:  clear to auscultation bilaterally, normal work of breathing GI: soft, nontender, nondistended, + BS MS: no deformity or atrophy  Skin: warm and dry, no rash Neuro:  Strength and sensation are intact Psych: euthymic mood, full affect Femoral pulses diminished bilaterally.  EKG:  EKG is  ordered today. EKG showed sinus bradycardia with minimal LVH.  No significant ST or T wave changes.    Recent Labs: No results found for requested labs within last 8760 hours.    Lipid Panel    Component Value Date/Time   CHOL 138 11/08/2019 1452   TRIG 98 11/08/2019 1452   HDL 56 11/08/2019 1452   CHOLHDL 2.5 11/08/2019 1452   CHOLHDL 2.8 03/09/2017 0855   VLDL 11 03/09/2017 0855   LDLCALC 64 11/08/2019 1452   LDLDIRECT 61 09/17/2018 1051      Wt Readings from Last 3 Encounters:  01/04/22 171 lb (77.6 kg)  06/29/21 170 lb (77.1 kg)  06/03/21 170 lb 6.4 oz (77.3 kg)      PAD Screen 08/05/2016  Previous PAD dx? No  Previous surgical procedure? No  Pain with walking? No  Feet/toe relief with dangling? No  Painful, non-healing ulcers? No  Extremities discolored? No      ASSESSMENT AND PLAN:  1.  Coronary artery disease involving native coronary arteries  without angina: She is status post one-vessel CABG with LIMA to LAD in October 2019.   No convincing anginal symptoms.  Continue medical therapy.  2. Hyperlipidemia: No side effects with rosuvastatin 10 mg once daily.  Most recent lipid profile showed an LDL of 99 but that was before increasing the dose of rosuvastatin.  I requested a follow-up lipid and liver profile.    3. Essential hypertension: Blood pressure continues to be intermittently mildly elevated.  She is on maximal dose losartan as well as amlodipine 5 mg daily.  She was prescribed hydrochlorothiazide 12.5 mg once daily but is not taking this medication as she is trying to avoid adding another medication.     Disposition:   FU with me in 6 months.  Signed,  [name], MD  01/04/2022 3:41 PM    [place]

## 2022-01-04 NOTE — Patient Instructions (Signed)
Medication Instructions:  Your physician recommends that you continue on your current medications as directed. Please refer to the Current Medication list given to you today.  *If you need a refill on your cardiac medications before your next appointment, please call your pharmacy*   Lab Work: Lipid and Lft today If you have labs (blood work) drawn today and your tests are completely normal, you will receive your results only by: [place] (if you have MyChart) OR A paper copy in the mail If you have any lab test that is abnormal or we need to change your treatment, we will call you to review the results.   Testing/Procedures: None ordered   Follow-Up: At [facility], you and your health needs are our priority.  As part of our continuing mission to provide you with exceptional heart care, we have created designated Provider Care Teams.  These Care Teams include your primary Cardiologist (physician) and Advanced Practice Providers (APPs -  Physician Assistants and Nurse Practitioners) who all work together to provide you with the care you need, when you need it.  We recommend signing up for the patient portal called "MyChart".  Sign up information is provided on this After Visit Summary.  MyChart is used to connect with patients for Virtual Visits (Telemedicine).  Patients are able to view lab/test results, encounter notes, upcoming appointments, etc.  Non-urgent messages can be sent to your provider as well.   To learn more about what you can do with MyChart, go to [email or web address].    Your next appointment:   6 month(s)  The format for your next appointment:   In Person  Provider:   You may see [name], MD or one of the following Advanced Practice Providers on your designated Care Team:   Murray Hodgkins, NP Christell Faith, PA-C Cadence Kathlen Mody, [place]   Other Instructions N/A

## 2022-01-05 LAB — HEPATIC FUNCTION PANEL
ALT: 37 IU/L — ABNORMAL HIGH (ref 0–32)
AST: 29 IU/L (ref 0–40)
Albumin: 4.8 g/dL (ref 3.8–4.8)
Alkaline Phosphatase: 98 IU/L (ref 44–121)
Bilirubin Total: <0.2 mg/dL (ref 0.0–1.2)
Bilirubin, Direct: <0.1 mg/dL (ref 0.00–0.40)
Total Protein: 7.1 g/dL (ref 6.0–8.5)

## 2022-01-05 LAB — LIPID PANEL
Chol/HDL Ratio: 3.5 ratio (ref 0.0–4.4)
Cholesterol, Total: 149 mg/dL (ref 100–199)
HDL: 42 mg/dL (ref >39)
LDL Chol Calc (NIH): 68 mg/dL (ref 0–99)
Triglycerides: 242 mg/dL — ABNORMAL HIGH (ref 0–149)
VLDL Cholesterol Cal: 39 mg/dL (ref 5–40)

## 2022-01-20 ENCOUNTER — Encounter: Payer: Self-pay | Admitting: Cardiovascular Disease

## 2022-01-21 ENCOUNTER — Other Ambulatory Visit: Payer: Self-pay | Admitting: Cardiovascular Disease

## 2022-01-24 ENCOUNTER — Encounter: Payer: Self-pay | Admitting: Cardiovascular Disease

## 2022-02-04 ENCOUNTER — Other Ambulatory Visit: Payer: Self-pay | Admitting: Cardiovascular Disease

## 2022-02-28 ENCOUNTER — Encounter: Payer: Self-pay | Admitting: Cardiovascular Disease

## 2022-03-01 MED ORDER — ROSUVASTATIN CALCIUM 10 MG PO TABS: ORAL_TABLET | ORAL | 1 refills | Status: DC

## 2022-03-01 MED ORDER — LOSARTAN POTASSIUM 100 MG PO TABS: ORAL_TABLET | ORAL | 1 refills | Status: DC

## 2022-03-01 MED ORDER — AMLODIPINE BESYLATE 5 MG PO TABS: 5.0000 mg | ORAL_TABLET | Freq: Every day | ORAL | 1 refills | Status: DC

## 2022-03-16 ENCOUNTER — Encounter: Payer: Self-pay | Admitting: Cardiovascular Disease

## 2022-04-28 ENCOUNTER — Other Ambulatory Visit: Payer: Self-pay | Admitting: Cardiovascular Disease

## 2022-04-29 ENCOUNTER — Other Ambulatory Visit: Payer: Self-pay | Admitting: Cardiovascular Disease

## 2022-05-10 ENCOUNTER — Telehealth: Payer: Self-pay | Admitting: Cardiovascular Disease

## 2022-05-10 ENCOUNTER — Other Ambulatory Visit: Payer: Self-pay | Admitting: Cardiovascular Disease

## 2022-05-10 NOTE — Telephone Encounter (Signed)
losartan (COZAAR) 100 MG tablet 90 tablet 0 05/10/2022    Sig: TAKE 1 TABLET(100 MG) BY MOUTH DAILY   Sent to pharmacy as: losartan (COZAAR) 100 MG tablet   Notes to Pharmacy: ZERO refills remain on this prescription. Your patient is requesting advance approval of refills for this medication to [facility]   E-Prescribing Status: Receipt confirmed by pharmacy (05/10/2022  9:36 AM EDT)

## 2022-05-10 NOTE — Telephone Encounter (Signed)
*  STAT* If patient is at the pharmacy, call can be transferred to refill team.   1. Which medications need to be refilled? (please list name of each medication and dose if known) losartan (COZAAR) 100 MG tablet  2. Which pharmacy/location (including street and city if local pharmacy) is medication to be sent to? [id number] - [place], [place] - [address]  3. Do they need a 30 day or 90 day supply? [place]

## 2022-06-01 ENCOUNTER — Telehealth: Payer: Self-pay | Admitting: Cardiovascular Disease

## 2022-06-01 NOTE — Telephone Encounter (Signed)
  Pt c/o of Chest Pain: STAT if CP now or developed within 24 hours  1. Are you having CP right now? Light pain   2. Are you experiencing any other symptoms (ex. SOB, nausea, vomiting, sweating)? Constant headache   3. How long have you been experiencing CP? Since Friday  4. Is your CP continuous or coming and going? continuous   5. Have you taken Nitroglycerin? No   Pt said, she's been feeling pain on her chest that goes to her jaw and neck. She also have constant headache and all the pain get worst when she move around or lay down. She said, she took Motrin but didn't help her. She made an appt with Christell Faith Friday at 1:55 pm  ?

## 2022-06-01 NOTE — Telephone Encounter (Signed)
Spoke with the patient. Patient sts that she has been having continuous chest pain since Friday. The pain is at the center of her chest and radiate to her neck and jaw. Pt also complains of a headache. The chest pain is associated with movement and inspiration. Pt sts that her VS, BP and HR have been with in normal limits. She tried Ibuprofen x1 with no relief.  Pt denies sob, n/v, diaphoresis, palpitations. She has been able to exercise with out chest pain/pressure. Adv the pt that her symptoms do seem musculoskeletal, pt agreed. Adv the pt to keep her appt on Fri with Ryan. Adv the pt that if symptoms worsen in the interim she should be evaluated in the ED. Recommended that she also reach out to her pcp to discuss possible non-cardiac causes of her symptoms. Pt verbalized understanding and voiced appreciation for the call.

## 2022-06-02 NOTE — Progress Notes (Deleted)
Cardiology Office Note    Date:  06/02/2022   ID:  Kim Villarreal, DOB [birth year], MRN [id number]  PCP:  Derinda Late, MD  Cardiologist:  [name], MD  Electrophysiologist:  None   Chief Complaint: ***  History of Present Illness:   Kim Villarreal is a 51 y.o. female with history of CAD status post one-vessel CABG with LIMA to LAD in 08/2018 due to recurrent in-stent restenosis, HTN, and HLD who presents for evaluation of ***  She underwent exercise MPI in 07/2016 that noted a small region of fixed perfusion defect in the mid to apical anteroseptal region consistent with breast attenuation artifact and was overall low risk.  Due to continued angina, she underwent LHC in 09/2016 that showed 95% stenosis in the mid LAD which was treated successfully with PCI/DES.  She underwent repeat LHC in 05/2018 2019 with recurrent angina that showed severe one-vessel CAD due to ISR in the proximal to mid LAD with otherwise no obstructive disease noted.  She underwent successful PCI/DES to the LAD for ISR.  An additional overlapping stent was placed distally due to suspected edge dissection.  Due to recurrent angina she underwent repeat cath in 08/2018 that showed severe one-vessel CAD with proximal to mid LAD stenosis of 95% due to ISR of the mid LAD.  Given this was the second time for ISR of the LAD, despite using 2 different drug-eluting stents, she underwent one-vessel CABG with LIMA to LAD.  P2y12 study was was unrevealing.  She most recently underwent cardiac cath in 04/2019 which showed significant underlying one-vessel CAD involving the LAD with patent LIMA to LAD with moderate anastomosis lesion estimated at 40%, otherwise no obstructive disease.  The LAD stent was noted to be completely occluded.  EF was noted to be normal with mildly elevated LVEDP.  Outpatient cardiac monitoring in 05/2019 demonstrated sinus rhythm with frequent PACs and rare PVCs.  Most recent echo from 12/2020 demonstrated  an EF of 60 to 65%, no regional wall motion abnormalities, grade 1 diastolic dysfunction, normal RV systolic function and ventricular cavity size, normal PASP, and mild mitral regurgitation.  At her last visit in 12/2021, she continued to note significant menopause symptoms.  She was also noted to have been found to have the MTHFR gene mutation.  She was without exertional chest pain, though did continue to note random intermittent chest pain at rest.  She contacted our office on 06/01/2022 noting continuous chest pain since 05/27/2022 that radiated to her neck and jaw and was associated with movement and inspiration.  She also noted a headache.  She was evaluated at her PCPs office on 06/01/2022 with pain felt to be pleuritic.  It was noted she had been able to complete a workout with her personal trainer the day prior without anginal symptoms.  She was provided a prescription of prednisone.  ***   Labs independently reviewed: 12/2021 - albumin 4.8, AST normal, ALT 37, TC 149, TG 242, HDL 42, LDL 68 06/2021 - Hgb 12.9, PLT 153, TSH normal, potassium 4.3, BUN 21, serum creatinine 0.9  Past Medical History:  Diagnosis Date   Anemia    "as a child"   Anxiety    Bursitis    Childhood asthma    Chronic lower back pain    Coronary artery disease    a. 09/2016: cath showing 95% stenosis of the mid-LAD. Xience 2.5x72m DES placed, remaining left and right coronary tree angiographically normal, lip swelling noted at the  end of the case without rash or respiratory distress, treated with IV Solu-Medrol and Benadryl.   History of nuclear stress test    a. 07/2016: no sig ischemia, nl wall motion, EF 72%, small defect along mid to apical anteroseptal wall c/w breast attenuation artifact, low risk study   Hypertension    Migraine    "it was a bout; beginning of 2017; for ~ 1 month; went away" (05/23/2018)   Mitral regurgitation    a. echo 08/2016: vigorous EF 65-70%, nl wall motion, nl LV diastolic fxn, mild MR    Tendonitis    Urticaria     Past Surgical History:  Procedure Laterality Date   CARDIAC CATHETERIZATION N/A 10/05/2016   Procedure: Left Heart Cath and Coronary Angiography;  Surgeon: Wellington Hampshire, MD;  Location: [facility];  Service: Cardiovascular;  Laterality: N/A;   CARDIAC CATHETERIZATION N/A 10/05/2016   Procedure: Coronary Stent Intervention;  Surgeon: Wellington Hampshire, MD;  Location: [facility];  Service: Cardiovascular;  Laterality: N/A;   CESAREAN SECTION  2003; 2005   CORONARY ANGIOPLASTY WITH STENT PLACEMENT  05/23/2018   "2 stents"   CORONARY ARTERY BYPASS GRAFT N/A 08/27/2018   Procedure: CORONARY ARTERY BYPASS GRAFTING (CABG) x 1 using LEFT INTERNAL MAMMARY ARTERY;  Surgeon: Gaye Pollack, MD;  Location: [facility];  Service: Open Heart Surgery;  Laterality: N/A;   CORONARY STENT INTERVENTION N/A 05/23/2018   Procedure: CORONARY STENT INTERVENTION;  Surgeon: Wellington Hampshire, MD;  Location: [facility];  Service: Cardiovascular;  Laterality: N/A;  mid LAD   ECTOPIC PREGNANCY SURGERY  1991   LEFT HEART CATH AND CORONARY ANGIOGRAPHY N/A 05/23/2018   Procedure: LEFT HEART CATH AND CORONARY ANGIOGRAPHY;  Surgeon: Wellington Hampshire, MD;  Location: [facility];  Service: Cardiovascular;  Laterality: N/A;   LEFT HEART CATH AND CORONARY ANGIOGRAPHY N/A 08/22/2018   Procedure: LEFT HEART CATH AND CORONARY ANGIOGRAPHY;  Surgeon: Wellington Hampshire, MD;  Location: [facility];  Service: Cardiovascular;  Laterality: N/A;   LEFT HEART CATH AND CORS/GRAFTS ANGIOGRAPHY Left 05/20/2019   Procedure: LEFT HEART CATH AND CORS/GRAFTS ANGIOGRAPHY;  Surgeon: Wellington Hampshire, MD;  Location: [facility];  Service: Cardiovascular;  Laterality: Left;   TEE WITHOUT CARDIOVERSION N/A 08/27/2018   Procedure: TRANSESOPHAGEAL ECHOCARDIOGRAM (TEE);  Surgeon: Gaye Pollack, MD;  Location: [facility];  Service: Open Heart Surgery;  Laterality: N/A;    Current  Medications: No outpatient medications have been marked as taking for the 06/03/22 encounter (Appointment) with Rise Mu, PA-C.    Allergies:   Codeine and Contrast media [iodinated contrast media]   Social History   Socioeconomic History   Marital status: Married    Spouse name: Not on file   Number of children: Not on file   Years of education: Not on file   Highest education level: Not on file  Occupational History   Not on file  Tobacco Use   Smoking status: Former    Packs/day: 0.75    Years: 15.00    Total pack years: 11.25    Types: Cigarettes    Quit date: 2007    Years since quitting: 16.5   Smokeless tobacco: Never  Vaping Use   Vaping Use: Never used  Substance and Sexual Activity   Alcohol use: Yes    Comment: occ   Drug use: Never   Sexual activity: Yes  Other Topics Concern  Not on file  Social History Narrative   Not on file   Social Determinants of Health   Financial Resource Strain: Not on file  Food Insecurity: Not on file  Transportation Needs: Not on file  Physical Activity: Not on file  Stress: Not on file  Social Connections: Not on file     Family History:  The patient's family history includes Heart disease in her mother; Parkinson's disease in her father.  ROS:   ROS   EKGs/Labs/Other Studies Reviewed:    Studies reviewed were summarized above. The additional studies were reviewed today:  2D echo 01/07/2021: 1. Left ventricular ejection fraction, by estimation, is 60 to 65%. The  left ventricle has normal function. The left ventricle has no regional  wall motion abnormalities. Left ventricular diastolic parameters are  consistent with Grade I diastolic  dysfunction (impaired relaxation). The average left ventricular global  longitudinal strain is -21.1 %. The global longitudinal strain is normal.   2. Right ventricular systolic function is normal. The right ventricular  size is normal. There is normal pulmonary artery  systolic pressure. The  estimated right ventricular systolic pressure is 58.8 mmHg.   3. The mitral valve is normal in structure. Mild mitral valve  regurgitation. __________  Elwyn Reach patch 05/2019: Normal sinus rhythm with an average heart rate of 66 bpm. Frequent PACs and rare PVCs. __________  LHC 05/20/2019: Prox LAD to Mid LAD lesion is 100% stenosed. The left ventricular systolic function is normal. LV end diastolic pressure is mildly elevated. The left ventricular ejection fraction is 55-65% by visual estimate. LIMA and is normal in caliber. Insertion lesion is 40% stenosed.   1.  Significant underlying one-vessel coronary artery disease involving the LAD with patent LIMA to LAD with moderate anastomosis lesion.  No other obstructive disease.  The LAD stent is now completely occluded.   2.  Normal LV systolic function and mildly elevated left ventricular end-diastolic pressure in the setting of significantly elevated blood pressure.   Recommendations: Continue aggressive medical therapy.  I added metoprolol for blood pressure and anginal control.  The patient might be ischemic in the proximal LAD territory. __________  Intraoperative TEE 08/27/2018:  Aortic valve: The valve is trileaflet. No stenosis. No regurgitation.   Right ventricle: Normal cavity size, wall thickness and ejection  fraction.   Tricuspid valve: Trace regurgitation. The tricuspid valve regurgitation  jet is central.  __________  2D echo 08/23/2018: - Left ventricle: The cavity size was normal. Wall thickness was    normal. Systolic function was normal. The estimated ejection    fraction was in the range of 60% to 65%.  - Left atrium: The atrium was mildly dilated. __________  LHC 08/22/2018: Prox LAD to Mid LAD lesion is 95% stenosed. The left ventricular systolic function is normal. LV end diastolic pressure is normal. The left ventricular ejection fraction is greater than 65% by visual estimate.    1.  Severe one-vessel coronary artery disease due to recurrent in-stent restenosis in the mid LAD.  No other obstructive disease. 2.  Normal LV systolic function and normal left ventricular end-diastolic pressure.   Recommendations: This is a difficult situation overall.  This is the second time for in-stent restenosis in the LAD in spite of using 2 different drug-eluting stents.  The vessel was stented just 3 months ago.  I do not think we have good options for getting good long-term results with stents or repeat balloon angioplasty.  I am going to obtain a  surgical opinion for off-pump LIMA to LAD.  The patient unfortunately started having chest pain during cardiac catheterization with significantly elevated blood pressure.  I am going to admit the patient to telemetry.  She has been on Plavix which I am going to discontinue in preparation for surgery.  Start heparin drip 8 hours after sheath pull without bolus. __________  LHC 05/23/2018: Prox LAD to Mid LAD lesion is 90% stenosed. Post intervention, there is a 0% residual stenosis. A drug-eluting stent was successfully placed using a [place] [id number].   1.  Severe one-vessel coronary artery disease due to in-stent restenosis in proximal to mid LAD.  No other obstructive disease.  The rest of the arteries look normal. 2.  Normal left ventricular end-diastolic pressure.  Left ventricular angiography was not performed due to severe radial artery spasm. 3.  Successful angioplasty and drug-eluting stent placement to the LAD for in-stent restenosis.  An additional overlapping stent was placed distally due to suspected edge dissection.   Recommendations:   Recommend uninterrupted dual antiplatelet therapy with Aspirin '81mg'$  daily and Clopidogrel '75mg'$  daily for a minimum of 6 months (stable ischemic heart disease - Class I recommendation).  However, longer duration is recommended given overlapped drug-eluting stent.   Aggressive treatment  of risk factors.   Avoid catheterization via the radial artery in the future due to severe spasm. __________  LHC 10/05/2016: The left ventricular systolic function is normal. LV end diastolic pressure is mildly elevated. The left ventricular ejection fraction is 55-65% by visual estimate. A STENT XIENCE ALPINE RX [id number] drug eluting stent was successfully placed. Mid LAD lesion, 95 %stenosed. Post intervention, there is a 0% residual stenosis.   1. Severe one-vessel coronary artery disease with 95% stenosis in the mid LAD. 2. Normal LV systolic function and mildly elevated left ventricular end-diastolic pressure. 3. Successful angioplasty and drug-eluting stent placement to the mid LAD.   Recommendations: Dual antiplatelet therapy for at least 6 months. Aggressive treatment of risk factors. The patient had mild lip swelling at the end of the case without rash or respiratory distress. This might be due to dye reaction. She was treated with IV Solu-Medrol and Benadryl. __________  2D echo 08/24/2016: - Left ventricle: The cavity size was normal. Wall thickness was    normal. Systolic function was vigorous. The estimated ejection    fraction was in the range of 65% to 70%. Wall motion was normal;    there were no regional wall motion abnormalities. Left    ventricular diastolic function parameters were normal.  - Mitral valve: There was mild regurgitation. __________  Treadmill MPI 08/11/2016: Exercise  myocardial perfusion imaging study with no significant  ischemia Small region of fixed perfusion defect noted in the mid to apical anteroseptal region, consistent with breast attenuation artifact   Normal wall motion, EF estimated at 67% No EKG changes concerning for ischemia at peak stress or in recovery. Target heart rate achieved HTN with exercise, peak BP 219/72 Low risk scan   EKG:  EKG is ordered today.  The EKG ordered today demonstrates ***  Recent Labs: 01/04/2022: ALT  37  Recent Lipid Panel    Component Value Date/Time   CHOL 149 01/04/2022 1544   TRIG 242 (H) 01/04/2022 1544   HDL 42 01/04/2022 1544   CHOLHDL 3.5 01/04/2022 1544   CHOLHDL 2.8 03/09/2017 0855   VLDL 11 03/09/2017 0855   LDLCALC 68 01/04/2022 1544   LDLDIRECT 61 09/17/2018 1051    PHYSICAL  EXAM:    VS:  There were no vitals taken for this visit.  BMI: There is no height or weight on file to calculate BMI.  Physical Exam  Wt Readings from Last 3 Encounters:  01/04/22 171 lb (77.6 kg)  06/29/21 170 lb (77.1 kg)  06/03/21 170 lb 6.4 oz (77.3 kg)     ASSESSMENT & PLAN:   CAD involving the native coronary artery status post one-vessel CABG with***:  HTN: Blood pressure  HLD: LDL 68 in 12/2021.   {Are you ordering a CV Procedure (e.g. stress test, cath, DCCV, TEE, etc)?   Press F2        :[id number]}     Disposition: F/u with Dr. Marland Kitchen or an APP in ***.   Medication Adjustments/Labs and Tests Ordered: Current medicines are reviewed at length with the patient today.  Concerns regarding medicines are outlined above. Medication changes, Labs and Tests ordered today are summarized above and listed in the Patient Instructions accessible in Encounters.   Signed, Christell Faith, PA-C 06/02/2022 12:46 PM     [address] [place], [place] [address] [phone]

## 2022-06-03 ENCOUNTER — Ambulatory Visit: Admitting: Physician Assistant

## 2022-07-12 ENCOUNTER — Ambulatory Visit (INDEPENDENT_AMBULATORY_CARE_PROVIDER_SITE_OTHER): Admitting: Cardiovascular Disease

## 2022-07-12 ENCOUNTER — Encounter: Payer: Self-pay | Admitting: Cardiovascular Disease

## 2022-07-12 VITALS — BP 120/60 | HR 63 | Ht 66.5 in | Wt 164.2 lb

## 2022-07-12 DIAGNOSIS — I10 Essential (primary) hypertension: Secondary | ICD-10-CM | POA: Diagnosis not present

## 2022-07-12 DIAGNOSIS — E785 Hyperlipidemia, unspecified: Secondary | ICD-10-CM

## 2022-07-12 DIAGNOSIS — I2510 Atherosclerotic heart disease of native coronary artery without angina pectoris: Secondary | ICD-10-CM

## 2022-07-12 MED ORDER — NITROGLYCERIN 0.4 MG SL SUBL: 0.4000 mg | SUBLINGUAL_TABLET | SUBLINGUAL | 0 refills | Status: AC | PRN

## 2022-07-12 NOTE — Progress Notes (Signed)
Cardiology Office Note   Date:  07/12/2022   ID:  Kim Villarreal, DOB [birth year], MRN [id number]  PCP:  Derinda Late, MD  Cardiologist:   [name], MD   Chief Complaint  Patient presents with   Other    6 Month f/u no complaints today. Meds reviewed verbally with pt.       History of Present Illness: Kim Villarreal is a 51 y.o. female who presents for a follow-up visit regarding coronary artery disease.  She is a previous smoker . She has no diabetes or hyperlipidemia.  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  She had recurrent angina in June. 2020 in the setting of significantly elevated blood pressure with exercise.  She underwent cardiac catheterization on June 29 which showed occluded proximal LAD with patent LIMA to LAD with moderate anastomosis lesion estimated to be 40%.  EF was normal with mildly elevated left ventricular end-diastolic pressure .  She had strong menopausal symptoms that subsequently responded to hormonal therapy.  She had pleuritic chest pain in July that responded to prednisone.  She has been doing extremely well with no recurrent chest pain, shortness of breath or palpitations.  She is doing intermittent fasting and has been able to lose weight.   Past Medical History:  Diagnosis Date   Anemia    "as a child"   Anxiety    Bursitis    Childhood asthma    Chronic lower back pain    Coronary artery disease    a. 09/2016: cath showing 95% stenosis of the mid-LAD. Xience 2.5x46m DES placed, remaining left and right coronary tree angiographically normal, lip swelling noted at the end of the case without rash or respiratory distress, treated with IV Solu-Medrol and Benadryl.   History of nuclear stress test    a. 07/2016: no sig ischemia, nl wall motion, EF 72%, small defect along mid to apical anteroseptal wall c/w breast attenuation artifact, low risk study   Hypertension     Migraine    "it was a bout; beginning of 2017; for ~ 1 month; went away" (05/23/2018)   Mitral regurgitation    a. echo 08/2016: vigorous EF 65-70%, nl wall motion, nl LV diastolic fxn, mild MR   Tendonitis    Urticaria     Past Surgical History:  Procedure Laterality Date   CARDIAC CATHETERIZATION N/A 10/05/2016   Procedure: Left Heart Cath and Coronary Angiography;  Surgeon: MWellington Hampshire MD;  [facility];  Service: Cardiovascular;  Laterality: N/A;   CARDIAC CATHETERIZATION N/A 10/05/2016   Procedure: Coronary Stent Intervention;  Surgeon: MWellington Hampshire MD;  [facility];  Service: Cardiovascular;  Laterality: N/A;   CESAREAN SECTION  2003; 2005   CORONARY ANGIOPLASTY WITH STENT PLACEMENT  05/23/2018   "2 stents"   CORONARY ARTERY BYPASS GRAFT N/A 08/27/2018   Procedure: CORONARY ARTERY BYPASS GRAFTING (CABG) x 1 using LEFT INTERNAL MAMMARY ARTERY;  Surgeon: BGaye Pollack MD;  Location[facility]  Service: Open Heart Surgery;  Laterality: N/A;   CORONARY STENT INTERVENTION N/A 05/23/2018   Procedure: CORONARY STENT INTERVENTION;  Surgeon: AWellington Hampshire MD;  [facility];  Service: Cardiovascular;  Laterality: N/A;  mid LAD   ECTOPIC PREGNANCY SURGERY  1991   LEFT HEART CATH AND CORONARY ANGIOGRAPHY N/A 05/23/2018   Procedure: LEFT HEART CATH AND CORONARY ANGIOGRAPHY;  Surgeon:  Wellington Hampshire, MD;  Location: [facility];  Service: Cardiovascular;  Laterality: N/A;   LEFT HEART CATH AND CORONARY ANGIOGRAPHY N/A 08/22/2018   Procedure: LEFT HEART CATH AND CORONARY ANGIOGRAPHY;  Surgeon: Wellington Hampshire, MD;  Location: [facility];  Service: Cardiovascular;  Laterality: N/A;   LEFT HEART CATH AND CORS/GRAFTS ANGIOGRAPHY Left 05/20/2019   Procedure: LEFT HEART CATH AND CORS/GRAFTS ANGIOGRAPHY;  Surgeon: Wellington Hampshire, MD;  Location: [facility];  Service: Cardiovascular;  Laterality: Left;   TEE WITHOUT  CARDIOVERSION N/A 08/27/2018   Procedure: TRANSESOPHAGEAL ECHOCARDIOGRAM (TEE);  Surgeon: Gaye Pollack, MD;  Location: [facility];  Service: Open Heart Surgery;  Laterality: N/A;     Current Outpatient Medications  Medication Sig Dispense Refill   amLODipine (NORVASC) 5 MG tablet Take 1 tablet (5 mg total) by mouth daily. 30 tablet 2   aspirin 325 MG EC tablet Take 1 tablet (325 mg total) by mouth daily.     estradiol (VIVELLE-DOT) 0.0375 MG/24HR every 3 (three) days.     losartan (COZAAR) 100 MG tablet TAKE 1 TABLET(100 MG) BY MOUTH DAILY 90 tablet 0   Multiple Vitamins-Minerals (MULTIVITAMIN GUMMIES ADULT PO) Take 1 Units by mouth daily.     nitrofurantoin (MACRODANTIN) 25 MG capsule Take 1 capsule (25 mg total) by mouth daily as needed (for UTI prevention). 30 capsule 6   NON FORMULARY Dim Pro 1 capsules daily     progesterone (PROMETRIUM) 100 MG capsule Take 100 mg by mouth daily.     rosuvastatin (CRESTOR) 10 MG tablet TAKE 1 TABLET(10 MG) BY MOUTH DAILY 90 tablet 0   Saccharomyces boulardii (PROBIOTIC) 250 MG CAPS Take 1 capsule by mouth daily.     nitroGLYCERIN (NITROSTAT) 0.4 MG SL tablet Place 1 tablet (0.4 mg total) under the tongue every 5 (five) minutes as needed for chest pain (Do not tak more than 3 tablets in any 24 hour period). 25 tablet 0   No current facility-administered medications for this visit.    Allergies:   Codeine and Contrast media [iodinated contrast media]    Social History:  The patient  reports that she quit smoking about 16 years ago. Her smoking use included cigarettes. She has a 11.25 pack-year smoking history. She has never used smokeless tobacco. She reports current alcohol use. She reports that she does not use drugs.   Family History:  The patient's family history includes Heart disease in her mother; Parkinson's disease in her father.    ROS:  Please see the history of present illness.   Otherwise, review of systems are positive for none.   All  other systems are reviewed and negative.    PHYSICAL EXAM: VS:  BP 120/60 (BP Location: Left Arm, Patient Position: Sitting, Cuff Size: Normal)   Pulse 63   Ht 5' 6.5" (1.689 m)   Wt 164 lb 4 oz (74.5 kg)   SpO2 99%   BMI 26.11 kg/m  , BMI Body mass index is 26.11 kg/m. GEN: Well nourished, well developed, in no acute distress  HEENT: normal  Neck: no JVD, carotid bruits, or masses Cardiac: RRR; no  rubs, or gallops,no edema . 1/6 systolic ejection murmur in the aortic area. Respiratory:  clear to auscultation bilaterally, normal work of breathing GI: soft, nontender, nondistended, + BS MS: no deformity or atrophy  Skin: warm and dry, no rash Neuro:  Strength and sensation are intact Psych: euthymic mood, full affect Femoral pulses diminished  bilaterally.  EKG:  EKG is  ordered today. EKG showed normal sinus rhythm with no significant ST or T wave changes.    Recent Labs: 01/04/2022: ALT 37    Lipid Panel    Component Value Date/Time   CHOL 149 01/04/2022 1544   TRIG 242 (H) 01/04/2022 1544   HDL 42 01/04/2022 1544   CHOLHDL 3.5 01/04/2022 1544   CHOLHDL 2.8 03/09/2017 0855   VLDL 11 03/09/2017 0855   LDLCALC 68 01/04/2022 1544   LDLDIRECT 61 09/17/2018 1051      Wt Readings from Last 3 Encounters:  07/12/22 164 lb 4 oz (74.5 kg)  01/04/22 171 lb (77.6 kg)  06/29/21 170 lb (77.1 kg)         08/05/2016   10:11 AM  PAD Screen  Previous PAD dx? No  Previous surgical procedure? No  Pain with walking? No  Feet/toe relief with dangling? No  Painful, non-healing ulcers? No  Extremities discolored? No      ASSESSMENT AND PLAN:  1.  Coronary artery disease involving native coronary arteries without angina: She is status post one-vessel CABG with LIMA to LAD in October 2019.   No convincing anginal symptoms.  Continue medical therapy.  2. Hyperlipidemia: No side effects with rosuvastatin 10 mg once daily.  Most recent lipid profile in February showed an LDL  of 68.  3. Essential hypertension: Blood pressure is well controlled.   Disposition:   FU with me in 12 months.  Signed,  [name], MD  07/12/2022 3:01 PM    [place]

## 2022-07-12 NOTE — Patient Instructions (Signed)

## 2022-07-21 NOTE — Addendum Note (Signed)
Addended by: Britt Bottom on: 07/21/2022 12:49 PM   Modules accepted: Orders

## 2022-09-06 ENCOUNTER — Other Ambulatory Visit
Admission: RE | Admit: 2022-09-06 | Discharge: 2022-09-06 | Disposition: A | Discharge status: Home / Self Care | Source: Ambulatory Visit | Attending: Nurse Practitioner | Admitting: Nurse Practitioner

## 2022-09-06 ENCOUNTER — Ambulatory Visit: Attending: Nurse Practitioner | Admitting: Nurse Practitioner

## 2022-09-06 ENCOUNTER — Telehealth: Payer: Self-pay | Admitting: Cardiovascular Disease

## 2022-09-06 ENCOUNTER — Telehealth: Payer: Self-pay | Admitting: Nurse Practitioner

## 2022-09-06 ENCOUNTER — Encounter: Payer: Self-pay | Admitting: Nurse Practitioner

## 2022-09-06 VITALS — BP 168/82 | HR 58 | Ht 66.5 in | Wt 155.0 lb

## 2022-09-06 DIAGNOSIS — Z951 Presence of aortocoronary bypass graft: Secondary | ICD-10-CM | POA: Insufficient documentation

## 2022-09-06 DIAGNOSIS — I25110 Atherosclerotic heart disease of native coronary artery with unstable angina pectoris: Secondary | ICD-10-CM | POA: Diagnosis not present

## 2022-09-06 DIAGNOSIS — I2510 Atherosclerotic heart disease of native coronary artery without angina pectoris: Secondary | ICD-10-CM | POA: Insufficient documentation

## 2022-09-06 DIAGNOSIS — E785 Hyperlipidemia, unspecified: Secondary | ICD-10-CM | POA: Insufficient documentation

## 2022-09-06 DIAGNOSIS — I10 Essential (primary) hypertension: Secondary | ICD-10-CM

## 2022-09-06 DIAGNOSIS — Z91041 Radiographic dye allergy status: Secondary | ICD-10-CM

## 2022-09-06 DIAGNOSIS — R079 Chest pain, unspecified: Secondary | ICD-10-CM | POA: Diagnosis present

## 2022-09-06 LAB — BASIC METABOLIC PANEL
Anion gap: 9 (ref 5–15)
BUN: 17 mg/dL (ref 6–20)
CO2: 26 mmol/L (ref 22–32)
Calcium: 10.2 mg/dL (ref 8.9–10.3)
Chloride: 107 mmol/L (ref 98–111)
Creatinine, Ser: 0.94 mg/dL (ref 0.44–1.00)
GFR, Estimated: >60 mL/min (ref >60)
Glucose, Bld: 98 mg/dL (ref 70–99)
Potassium: 4.6 mmol/L (ref 3.5–5.1)
Sodium: 142 mmol/L (ref 135–145)

## 2022-09-06 LAB — CBC
HCT: 44 % (ref 36.0–46.0)
Hemoglobin: 14.2 g/dL (ref 12.0–15.0)
MCH: 28 pg (ref 26.0–34.0)
MCHC: 32.3 g/dL (ref 30.0–36.0)
MCV: 86.6 fL (ref 80.0–100.0)
Platelets: 173 10*3/uL (ref 150–400)
RBC: 5.08 MIL/uL (ref 3.87–5.11)
RDW: 13.9 % (ref 11.5–15.5)
WBC: 4.5 10*3/uL (ref 4.0–10.5)
nRBC: 0 % (ref 0.0–0.2)

## 2022-09-06 MED ORDER — DIPHENHYDRAMINE HCL 50 MG PO CAPS: ORAL_CAPSULE | ORAL | 0 refills | Status: DC

## 2022-09-06 MED ORDER — PREDNISONE 50 MG PO TABS: ORAL_TABLET | ORAL | 0 refills | Status: DC

## 2022-09-06 MED ORDER — ISOSORBIDE MONONITRATE ER 30 MG PO TB24: 30.0000 mg | ORAL_TABLET | Freq: Every day | ORAL | 3 refills | Status: DC

## 2022-09-06 NOTE — Telephone Encounter (Signed)
Spoke to patient she stated she had a episode of chest tightness,tightness in both hands radiating up into chin and top teeth yesterday after she walked 2 miles,lasted appox 5 mins.Stated she has been having these same symptoms like she had before her CABG off and on for the past several months.Stated yesterday was the worst.No chest tightness at present.Appointment scheduled with Ignacia Bayley NP this morning at 10:55 Arrive at 10:40 am.I will make Dr.Arida aware.

## 2022-09-06 NOTE — Telephone Encounter (Signed)
Spoke with patient and reviewed date and time which she felt was long time to wait. Provider would like it to be done sooner so will reach out to scheduling   Called back to scheduling and was able to move procedure up to 09/09/22 at 07:30 am with arrival time of 06:30 am.   Called patient back to review date and time for her heart cath. She verbalized understanding, confirmed date and time for her procedure and had no further questions at this time.

## 2022-09-06 NOTE — Patient Instructions (Addendum)
Medication Instructions:  Your physician has recommended you make the following change in your medication:   START Imdur 30 mg once daily  *If you need a refill on your cardiac medications before your next appointment, please call your pharmacy*   Lab Work: [place] today over at the [facility]. Stop at registration desk for check in.   If you have labs (blood work) drawn today and your tests are completely normal, you will receive your results only by: [place] (if you have MyChart) OR A paper copy in the mail If you have any lab test that is abnormal or we need to change your treatment, we will call you to review the results.   Testing/Procedures: [facility] Cardiac Cath Instructions  You are scheduled for a Cardiac Cath on:09/19/22 Please arrive at 08:30 am on the day of your procedure Please expect a call from our [facility] to pre-register you Do not eat/drink anything after midnight Someone will need to drive you home It is recommended someone be with you for the first 24 hours after your procedure Wear clothes that are easy to get on/off and wear slip on shoes if possible   Medications bring a current list of all medications with you  _XX__ You may take all of your medications the morning of your procedure with enough water to swallow safely  _XX__ Take Prednisone 50 mg 1 tablet 13 hours, 7 hours and 1 hour before.   _XX__ Take Benadryl 50 mg 1 hour before procedure.    Day of your procedure: Arrive at the [facility] entrance.  Free valet service is available.  After entering the [place] please check-in at the registration desk (1st desk on your right) to receive your armband. After receiving your armband someone will escort you to the cardiac cath/special procedures waiting area.  The usual length of stay after your procedure is about 2 to 3 hours.  This can vary.  If you have any questions, please call our office at  [phone], or you may call the cardiac cath lab at [facility] directly at [phone]    Follow-Up: At [facility], you and your health needs are our priority.  As part of our continuing mission to provide you with exceptional heart care, we have created designated Provider Care Teams.  These Care Teams include your primary Cardiologist (physician) and Advanced Practice Providers (APPs -  Physician Assistants and Nurse Practitioners) who all work together to provide you with the care you need, when you need it.   Your next appointment:   2 week(s) after heart cath  The format for your next appointment:   In Person  Provider:   [name], MD or Murray Hodgkins, NP        Important Information About Sugar

## 2022-09-06 NOTE — Telephone Encounter (Signed)
Pt c/o of Chest Pain: STAT if CP now or developed within 24 hours  1. Are you having CP right now?  No   2. Are you experiencing any other symptoms (ex. SOB, nausea, vomiting, sweating)?  achiness that radiates from arms to chest, SOB, chest tightness that developed after a workout yesterday, increased anxiety lately  3. How long have you been experiencing CP?  Past few weeks   4. Is your CP continuous or coming and going?  Coming and going   5. Have you taken Nitroglycerin?  Patient states she has nitro, but hasn't taken any ?

## 2022-09-06 NOTE — Progress Notes (Signed)
Office Visit    Patient Name: Kim Villarreal Date of Encounter: 09/06/2022  Primary Care Provider:  Derinda Late, MD Primary Cardiologist:  [name], MD  Chief Complaint    51 year old female with a history of CAD status post CABG x1 (LIMA  LAD), hypertension, hyperlipidemia, diastolic dysfunction, and palpitations, who presents for follow-up of CAD.Marland Kitchen  Past Medical History    Past Medical History:  Diagnosis Date   Anemia    "as a child"   Anxiety    Bursitis    Childhood asthma    Chronic lower back pain    Contrast media allergy    a. 09/2016 Cath: lip swelling w/o rash or resp distress->managed w/ solumedrol/benadryl.   Coronary artery disease    a. 09/2016 PCI: LAD 68m(Xience 2.5x122mDES); b. 05/2018 PCI: LAD 90p/m ISR (2.75x18 Resolute Onyx DES); c. 08/2018 Cath: LAD 95p/m ISR; d. 08/2018 CABG x 1: LIMA->LAD; e. 04/2019 Cath: LM nl, LAD 100p/m, LCX min irregs, OM1-3 nl, RCA nl, LIMA->LAD 40% at anastomosis. EF 55-65%.   Diastolic dysfunction    Hypertension    Migraine    "it was a bout; beginning of 2017; for ~ 1 month; went away" (05/23/2018)   Mild Mitral regurgitation    a. 08/2016 Echo: EF 65-70%, nl wall motion, nl LV diastolic fxn, mild MR; b. 12/2020 Echo: EF 60-65%, no rwma, GrI DD, nl RV fxn, RVSP 33.73m21m. Mild MR.   Palpitations    a. 05/2019 Zio: RSR @ 66. Freq PACs, rare PVCs.   Tendonitis    Urticaria    Past Surgical History:  Procedure Laterality Date   CARDIAC CATHETERIZATION N/A 10/05/2016   Procedure: Left Heart Cath and Coronary Angiography;  Surgeon: MuhWellington HampshireD;  Location: [facility];  Service: Cardiovascular;  Laterality: N/A;   CARDIAC CATHETERIZATION N/A 10/05/2016   Procedure: Coronary Stent Intervention;  Surgeon: MuhWellington HampshireD;  Location: [facility];  Service: Cardiovascular;  Laterality: N/A;   CESAREAN SECTION  2003; 2005   CORONARY ANGIOPLASTY WITH STENT PLACEMENT  05/23/2018   "2 stents"    CORONARY ARTERY BYPASS GRAFT N/A 08/27/2018   Procedure: CORONARY ARTERY BYPASS GRAFTING (CABG) x 1 using LEFT INTERNAL MAMMARY ARTERY;  Surgeon: BarGaye PollackD;  [facility] [place];  Laterality: N/A;   CORONARY STENT INTERVENTION N/A 05/23/2018   Procedure: CORONARY STENT INTERVENTION;  Surgeon: AriWellington HampshireD;  Location: [facility];  Service: Cardiovascular;  Laterality: N/A;  mid LAD   ECTOPIC PREGNANCY SURGERY  1991   LEFT HEART CATH AND CORONARY ANGIOGRAPHY N/A 05/23/2018   Procedure: LEFT HEART CATH AND CORONARY ANGIOGRAPHY;  Surgeon: AriWellington HampshireD;  Location: [facility];  Service: Cardiovascular;  Laterality: N/A;   LEFT HEART CATH AND CORONARY ANGIOGRAPHY N/A 08/22/2018   Procedure: LEFT HEART CATH AND CORONARY ANGIOGRAPHY;  Surgeon: AriWellington HampshireD;  Location: [facility];  Service: Cardiovascular;  Laterality: N/A;   LEFT HEART CATH AND CORS/GRAFTS ANGIOGRAPHY Left 05/20/2019   Procedure: LEFT HEART CATH AND CORS/GRAFTS ANGIOGRAPHY;  Surgeon: AriWellington HampshireD;  [place];  Service: Cardiovascular;  Laterality: Left;   TEE WITHOUT CARDIOVERSION N/A 08/27/2018   Procedure: TRANSESOPHAGEAL ECHOCARDIOGRAM (TEE);  Surgeon: BarGaye PollackD;  [facility];  Laterality: N/A;    Allergies  Allergies  Allergen Reactions  Codeine Nausea And Vomiting   Contrast Media [Iodinated Contrast Media] Swelling    The patient had mild lip swelling at the end of the case without rash or respiratory distress. This might be due to dye reaction. 2017    History of Present Illness    51 year old female with above past medical history including CAD, hypertension, hyperlipidemia, diastolic dysfunction, and palpitations.  She previously underwent stenting of the proximal mid LAD in November 2017 with repeat stenting in July 2019 due to in-stent restenosis.  Unfortunately, she  had recurrent in-stent restenosis in October 2019 despite dual antiplatelet therapy.  She was evaluated by CT surgery in [facility] and subsequently underwent CABG x1 with a LIMA to the LAD.  She had recurrent angina in June 2020 in the setting of elevated blood pressure with exercise and she underwent diagnostic catheterization showing an occluded LAD with patent LIMA to the LAD with moderate anastomotic disease of 40%.  She has been medically managed.  Most recent echocardiogram November 2022 showed an EF of 60 to 65% with grade 1 diastolic dysfunction, RVSP of 33.9 mmHg, and mild MR.  Ms. Vanepps was last seen in cardiology clinic in August 2023 at which time she was recovering from chest wall pain in July that had resolved with prednisone therapy.  She was otherwise doing well.  She notes that ever since that visit, she has been experiencing progressive rest and exertional angina, reminiscent to prior angina.  Symptoms are now typically occurring after periods of activity (she walks up to 5 miles a day in 1 to 2 mile increments) and may start as either substernal chest burning, dental and lower facial pain, or bilateral upper chest discomfort just medial to the axillary areas.  Symptoms typically last a few minutes and resolve with rest.  She notes that all symptoms are similar to what she experienced prior to previous stenting and subsequently bypass surgery.  She does not typically experience dyspnea or other associated symptoms.  Because of progressive and more bothersome symptoms, she contacted our office this morning and was added on for visit today.  She is currently chest pain-free.  Home Medications    Current Outpatient Medications  Medication Sig Dispense Refill   amLODipine (NORVASC) 5 MG tablet Take 1 tablet (5 mg total) by mouth daily. 30 tablet 2   aspirin 325 MG EC tablet Take 1 tablet (325 mg total) by mouth daily.     diphenhydrAMINE (BENADRYL) 50 MG capsule Take one capsule 1 hour prior  to scan. 1 capsule 0   estradiol (VIVELLE-DOT) 0.0375 MG/24HR every 3 (three) days.     isosorbide mononitrate (IMDUR) 30 MG 24 hr tablet Take 1 tablet (30 mg total) by mouth daily. 90 tablet 3   losartan (COZAAR) 100 MG tablet TAKE 1 TABLET(100 MG) BY MOUTH DAILY 90 tablet 0   Multiple Vitamins-Minerals (MULTIVITAMIN GUMMIES ADULT PO) Take 1 Units by mouth daily.     nitrofurantoin (MACRODANTIN) 25 MG capsule Take 1 capsule (25 mg total) by mouth daily as needed (for UTI prevention). 30 capsule 6   nitroGLYCERIN (NITROSTAT) 0.4 MG SL tablet Place 1 tablet (0.4 mg total) under the tongue every 5 (five) minutes as needed for chest pain (Do not tak more than 3 tablets in any 24 hour period). 25 tablet 0   NON FORMULARY Dim Pro 1 capsules daily     predniSONE (DELTASONE) 50 MG tablet Take one tablet 13 hours, 7 hours, and 1 hour prior to scan. 3  tablet 0   progesterone (PROMETRIUM) 100 MG capsule Take 100 mg by mouth daily.     rosuvastatin (CRESTOR) 10 MG tablet TAKE 1 TABLET(10 MG) BY MOUTH DAILY 90 tablet 0   Saccharomyces boulardii (PROBIOTIC) 250 MG CAPS Take 1 capsule by mouth daily.     No current facility-administered medications for this visit.    Family history    Family History  Problem Relation Age of Onset   Heart disease Mother    Parkinson's disease Father      Social history    Social History   Socioeconomic History   Marital status: Married    Spouse name: Not on file   Number of children: Not on file   Years of education: Not on file   Highest education level: Not on file  Occupational History   Not on file  Tobacco Use   Smoking status: Former    Packs/day: 0.75    Years: 15.00    Total pack years: 11.25    Types: Cigarettes    Quit date: 2007    Years since quitting: 16.8   Smokeless tobacco: Never  Vaping Use   Vaping Use: Never used  Substance and Sexual Activity   Alcohol use: Yes    Comment: occ   Drug use: Never   Sexual activity: Yes  Other  Topics Concern   Not on file  Social History Narrative   Not on file   Social Determinants of Health   Financial Resource Strain: Not on file  Food Insecurity: Not on file  Transportation Needs: Not on file  Physical Activity: Not on file  Stress: Not on file  Social Connections: Not on file  Intimate Partner Violence: Not on file    Review of Systems    General:  No chills, fever, night sweats or weight changes.  Cardiovascular:  +++ chest pain, no dyspnea on exertion, edema, orthopnea, palpitations, paroxysmal nocturnal dyspnea. Dermatological: No rash, lesions/masses Respiratory: No cough, dyspnea Urologic: No hematuria, dysuria Abdominal:   No nausea, vomiting, diarrhea, bright red blood per rectum, melena, or hematemesis Neurologic:  No visual changes, wkns, changes in mental status. All other systems reviewed and are otherwise negative except as noted above.    Physical Exam    VS:  BP (!) 162/76 (BP Location: Left Arm, Patient Position: Sitting, Cuff Size: Normal)   Pulse (!) 58   Ht 5' 6.5" (1.689 m)   Wt 155 lb (70.3 kg)   SpO2 99%   BMI 24.64 kg/m  , BMI Body mass index is 24.64 kg/m.     Vitals:   09/06/22 1102 09/06/22 1248  BP: (!) 162/76 (!) 168/82  Pulse: (!) 58   SpO2: 99%     GEN: Well nourished, well developed, in no acute distress. HEENT: normal. Neck: Supple, no JVD, carotid bruits, or masses. Cardiac: RRR, no murmurs, rubs, or gallops. No clubbing, cyanosis, edema.  Radials/PT 2+ and equal bilaterally.  Respiratory:  Respirations regular and unlabored, clear to auscultation bilaterally. GI: Soft, nontender, nondistended, BS + x 4. MS: no deformity or atrophy. Skin: warm and dry, no rash. Neuro:  Strength and sensation are intact. Psych: Normal affect.  Accessory Clinical Findings    ECG personally reviewed by me today -sinus bradycardia, 58- no acute changes.  Lab Results  Component Value Date   WBC 4.5 09/06/2022   HGB 14.2  09/06/2022   HCT 44.0 09/06/2022   MCV 86.6 09/06/2022   PLT 173 09/06/2022  Lab Results  Component Value Date   CREATININE 0.92 11/08/2019   BUN 17 11/08/2019   NA 139 11/08/2019   K 4.4 11/08/2019   CL 103 11/08/2019   CO2 22 11/08/2019   Lab Results  Component Value Date   ALT 37 (H) 01/04/2022   AST 29 01/04/2022   ALKPHOS 98 01/04/2022   BILITOT <0.2 01/04/2022   Lab Results  Component Value Date   CHOL 149 01/04/2022   HDL 42 01/04/2022   LDLCALC 68 01/04/2022   LDLDIRECT 61 09/17/2018   TRIG 242 (H) 01/04/2022   CHOLHDL 3.5 01/04/2022    Lab Results  Component Value Date   HGBA1C 5.7 (H) 05/23/2019    Assessment & Plan    1.  Unstable angina/coronary artery disease: Patient with prior history of LAD disease status post stenting in 2010 with restenosis x2 in 2019 and subsequent CABG x1 with a LIMA to the LAD in October 2019.  Diagnostic catheterization in 2020 showed a 40% anastomotic lesion in the LIMA graft.  She has been medically and since then and over the past month or so, she has been noticing increasing angina reminiscent of prior symptoms.  Though she is fairly active and exercises/walks daily, she does not usually experience symptoms with activity but immediately thereafter, she has been having chest burning lower facial/dental pain, similar to prior angina.  We discussed options for management today and will pursue diagnostic catheterization.  The patient understands that risks include but are not limited to stroke (1 in 1000), death (1 in 39), kidney failure [usually temporary] (1 in 500), bleeding (1 in 200), allergic reaction [possibly serious] (1 in 200), and agrees to proceed.   We will follow-up a CBC and basic metabolic panel today.  Adding Imdur 30 mg daily.  Continue aspirin, statin, calcium channel blocker, and ARB.  She does have a contrast allergy and will be pretreated with prednisone 50 mg at 13, 7, and 1 hour prior to catheterization, along with  Benadryl.  2.  Essential hypertension: Blood pressure elevated today at 162/76 and 168/82 on repeat.  She notes that she typically runs more normally and usually does not take her blood pressure medicines until noon.  With elevated pressures and unstable angina, adding Imdur 30 mg daily.  Continue current doses of amlodipine and losartan.  Patient will continue to follow blood pressures at home.  3.  Hyperlipidemia: LDL of 68 in February.  She remains on statin therapy.  4.  Contrast allergy: See #1.  Pretreatment with prednisone 50 mg at 13, 7, and 1 hour prior to diagnostic catheterization, along w/ benadryl.  5.  Disposition: Follow-up CBC and basic metabolic panel today.  Plan for diagnostic catheterization early next week with Dr. Fletcher Anon.  Follow-up in clinic approximate 2 weeks post catheterization.  Murray Hodgkins, NP 09/06/2022, 12:48 PM

## 2022-09-06 NOTE — H&P (View-Only) (Signed)
Office Visit    Patient Name: Kim Villarreal Date of Encounter: 09/06/2022  Primary Care Provider:  Derinda Late, MD Primary Cardiologist:  [name], MD  Chief Complaint    51 year old female with a history of CAD status post CABG x1 (LIMA  LAD), hypertension, hyperlipidemia, diastolic dysfunction, and palpitations, who presents for follow-up of CAD.Marland Kitchen  Past Medical History    Past Medical History:  Diagnosis Date   Anemia    "as a child"   Anxiety    Bursitis    Childhood asthma    Chronic lower back pain    Contrast media allergy    a. 09/2016 Cath: lip swelling w/o rash or resp distress->managed w/ solumedrol/benadryl.   Coronary artery disease    a. 09/2016 PCI: LAD 71m(Xience 2.5x162mDES); b. 05/2018 PCI: LAD 90p/m ISR (2.75x18 Resolute Onyx DES); c. 08/2018 Cath: LAD 95p/m ISR; d. 08/2018 CABG x 1: LIMA->LAD; e. 04/2019 Cath: LM nl, LAD 100p/m, LCX min irregs, OM1-3 nl, RCA nl, LIMA->LAD 40% at anastomosis. EF 55-65%.   Diastolic dysfunction    Hypertension    Migraine    "it was a bout; beginning of 2017; for ~ 1 month; went away" (05/23/2018)   Mild Mitral regurgitation    a. 08/2016 Echo: EF 65-70%, nl wall motion, nl LV diastolic fxn, mild MR; b. 12/2020 Echo: EF 60-65%, no rwma, GrI DD, nl RV fxn, RVSP 33.43m2m. Mild MR.   Palpitations    a. 05/2019 Zio: RSR @ 66. Freq PACs, rare PVCs.   Tendonitis    Urticaria    Past Surgical History:  Procedure Laterality Date   CARDIAC CATHETERIZATION N/A 10/05/2016   Procedure: Left Heart Cath and Coronary Angiography;  Surgeon: MuhWellington HampshireD;  [facility];  Service: Cardiovascular;  Laterality: N/A;   CARDIAC CATHETERIZATION N/A 10/05/2016   Procedure: Coronary Stent Intervention;  Surgeon: MuhWellington HampshireD;  [facility]: [facility] [place] [facility];  Service: Cardiovascular;  Laterality: N/A;   CESAREAN SECTION  2003; 2005   CORONARY ANGIOPLASTY WITH STENT PLACEMENT  05/23/2018   "2 stents"    CORONARY ARTERY BYPASS GRAFT N/A 08/27/2018   Procedure: CORONARY ARTERY BYPASS GRAFTING (CABG) x 1 using LEFT INTERNAL MAMMARY ARTERY;  Surgeon: BarGaye PollackD;  [facility];  Laterality: N/A;   CORONARY STENT INTERVENTION N/A 05/23/2018   Procedure: CORONARY STENT INTERVENTION;  Surgeon: AriWellington HampshireD;  [facility]: [facility];  Service: Cardiovascular;  Laterality: N/A;  mid LAD   ECTOPIC PREGNANCY SURGERY  1991   LEFT HEART CATH AND CORONARY ANGIOGRAPHY N/A 05/23/2018   Procedure: LEFT HEART CATH AND CORONARY ANGIOGRAPHY;  Surgeon: AriWellington HampshireD;  Location: [facility] [place] [facility];  Service: Cardiovascular;  Laterality: N/A;   LEFT HEART CATH AND CORONARY ANGIOGRAPHY N/A 08/22/2018   Procedure: LEFT HEART CATH AND CORONARY ANGIOGRAPHY;  Surgeon: AriWellington HampshireD;  [facility];  Service: Cardiovascular;  Laterality: N/A;   LEFT HEART CATH AND CORS/GRAFTS ANGIOGRAPHY Left 05/20/2019   Procedure: LEFT HEART CATH AND CORS/GRAFTS ANGIOGRAPHY;  Surgeon: AriWellington HampshireD;  [facility];  Service: Cardiovascular;  Laterality: Left;   TEE WITHOUT CARDIOVERSION N/A 08/27/2018   Procedure: TRANSESOPHAGEAL ECHOCARDIOGRAM (TEE);  Surgeon: BarGaye PollackD;  [facility];  Laterality: N/A;    Allergies  Allergies  Allergen Reactions  Codeine Nausea And Vomiting   Contrast Media [Iodinated Contrast Media] Swelling    The patient had mild lip swelling at the end of the case without rash or respiratory distress. This might be due to dye reaction. 2017    History of Present Illness    51 year old female with above past medical history including CAD, hypertension, hyperlipidemia, diastolic dysfunction, and palpitations.  She previously underwent stenting of the proximal mid LAD in November 2017 with repeat stenting in July 2019 due to in-stent restenosis.  Unfortunately, she  had recurrent in-stent restenosis in October 2019 despite dual antiplatelet therapy.  She was evaluated by CT surgery in [facility] and subsequently underwent CABG x1 with a LIMA to the LAD.  She had recurrent angina in June 2020 in the setting of elevated blood pressure with exercise and she underwent diagnostic catheterization showing an occluded LAD with patent LIMA to the LAD with moderate anastomotic disease of 40%.  She has been medically managed.  Most recent echocardiogram November 2022 showed an EF of 60 to 65% with grade 1 diastolic dysfunction, RVSP of 33.9 mmHg, and mild MR.  Kim Villarreal was last seen in cardiology clinic in August 2023 at which time she was recovering from chest wall pain in July that had resolved with prednisone therapy.  She was otherwise doing well.  She notes that ever since that visit, she has been experiencing progressive rest and exertional angina, reminiscent to prior angina.  Symptoms are now typically occurring after periods of activity (she walks up to 5 miles a day in 1 to 2 mile increments) and may start as either substernal chest burning, dental and lower facial pain, or bilateral upper chest discomfort just medial to the axillary areas.  Symptoms typically last a few minutes and resolve with rest.  She notes that all symptoms are similar to what she experienced prior to previous stenting and subsequently bypass surgery.  She does not typically experience dyspnea or other associated symptoms.  Because of progressive and more bothersome symptoms, she contacted our office this morning and was added on for visit today.  She is currently chest pain-free.  Home Medications    Current Outpatient Medications  Medication Sig Dispense Refill   amLODipine (NORVASC) 5 MG tablet Take 1 tablet (5 mg total) by mouth daily. 30 tablet 2   aspirin 325 MG EC tablet Take 1 tablet (325 mg total) by mouth daily.     diphenhydrAMINE (BENADRYL) 50 MG capsule Take one capsule 1 hour prior  to scan. 1 capsule 0   estradiol (VIVELLE-DOT) 0.0375 MG/24HR every 3 (three) days.     isosorbide mononitrate (IMDUR) 30 MG 24 hr tablet Take 1 tablet (30 mg total) by mouth daily. 90 tablet 3   losartan (COZAAR) 100 MG tablet TAKE 1 TABLET(100 MG) BY MOUTH DAILY 90 tablet 0   Multiple Vitamins-Minerals (MULTIVITAMIN GUMMIES ADULT PO) Take 1 Units by mouth daily.     nitrofurantoin (MACRODANTIN) 25 MG capsule Take 1 capsule (25 mg total) by mouth daily as needed (for UTI prevention). 30 capsule 6   nitroGLYCERIN (NITROSTAT) 0.4 MG SL tablet Place 1 tablet (0.4 mg total) under the tongue every 5 (five) minutes as needed for chest pain (Do not tak more than 3 tablets in any 24 hour period). 25 tablet 0   NON FORMULARY Dim Pro 1 capsules daily     predniSONE (DELTASONE) 50 MG tablet Take one tablet 13 hours, 7 hours, and 1 hour prior to scan. 3  tablet 0   progesterone (PROMETRIUM) 100 MG capsule Take 100 mg by mouth daily.     rosuvastatin (CRESTOR) 10 MG tablet TAKE 1 TABLET(10 MG) BY MOUTH DAILY 90 tablet 0   Saccharomyces boulardii (PROBIOTIC) 250 MG CAPS Take 1 capsule by mouth daily.     No current facility-administered medications for this visit.    Family history    Family History  Problem Relation Age of Onset   Heart disease Mother    Parkinson's disease Father      Social history    Social History   Socioeconomic History   Marital status: Married    Spouse name: Not on file   Number of children: Not on file   Years of education: Not on file   Highest education level: Not on file  Occupational History   Not on file  Tobacco Use   Smoking status: Former    Packs/day: 0.75    Years: 15.00    Total pack years: 11.25    Types: Cigarettes    Quit date: 2007    Years since quitting: 16.8   Smokeless tobacco: Never  Vaping Use   Vaping Use: Never used  Substance and Sexual Activity   Alcohol use: Yes    Comment: occ   Drug use: Never   Sexual activity: Yes  Other  Topics Concern   Not on file  Social History Narrative   Not on file   Social Determinants of Health   Financial Resource Strain: Not on file  Food Insecurity: Not on file  Transportation Needs: Not on file  Physical Activity: Not on file  Stress: Not on file  Social Connections: Not on file  Intimate Partner Violence: Not on file    Review of Systems    General:  No chills, fever, night sweats or weight changes.  Cardiovascular:  +++ chest pain, no dyspnea on exertion, edema, orthopnea, palpitations, paroxysmal nocturnal dyspnea. Dermatological: No rash, lesions/masses Respiratory: No cough, dyspnea Urologic: No hematuria, dysuria Abdominal:   No nausea, vomiting, diarrhea, bright red blood per rectum, melena, or hematemesis Neurologic:  No visual changes, wkns, changes in mental status. All other systems reviewed and are otherwise negative except as noted above.    Physical Exam    VS:  BP (!) 162/76 (BP Location: Left Arm, Patient Position: Sitting, Cuff Size: Normal)   Pulse (!) 58   Ht 5' 6.5" (1.689 m)   Wt 155 lb (70.3 kg)   SpO2 99%   BMI 24.64 kg/m  , BMI Body mass index is 24.64 kg/m.     Vitals:   09/06/22 1102 09/06/22 1248  BP: (!) 162/76 (!) 168/82  Pulse: (!) 58   SpO2: 99%     GEN: Well nourished, well developed, in no acute distress. HEENT: normal. Neck: Supple, no JVD, carotid bruits, or masses. Cardiac: RRR, no murmurs, rubs, or gallops. No clubbing, cyanosis, edema.  Radials/PT 2+ and equal bilaterally.  Respiratory:  Respirations regular and unlabored, clear to auscultation bilaterally. GI: Soft, nontender, nondistended, BS + x 4. MS: no deformity or atrophy. Skin: warm and dry, no rash. Neuro:  Strength and sensation are intact. Psych: Normal affect.  Accessory Clinical Findings    ECG personally reviewed by me today -sinus bradycardia, 58- no acute changes.  Lab Results  Component Value Date   WBC 4.5 09/06/2022   HGB 14.2  09/06/2022   HCT 44.0 09/06/2022   MCV 86.6 09/06/2022   PLT 173 09/06/2022  Lab Results  Component Value Date   CREATININE 0.92 11/08/2019   BUN 17 11/08/2019   NA 139 11/08/2019   K 4.4 11/08/2019   CL 103 11/08/2019   CO2 22 11/08/2019   Lab Results  Component Value Date   ALT 37 (H) 01/04/2022   AST 29 01/04/2022   ALKPHOS 98 01/04/2022   BILITOT <0.2 01/04/2022   Lab Results  Component Value Date   CHOL 149 01/04/2022   HDL 42 01/04/2022   LDLCALC 68 01/04/2022   LDLDIRECT 61 09/17/2018   TRIG 242 (H) 01/04/2022   CHOLHDL 3.5 01/04/2022    Lab Results  Component Value Date   HGBA1C 5.7 (H) 05/23/2019    Assessment & Plan    1.  Unstable angina/coronary artery disease: Patient with prior history of LAD disease status post stenting in 2010 with restenosis x2 in 2019 and subsequent CABG x1 with a LIMA to the LAD in October 2019.  Diagnostic catheterization in 2020 showed a 40% anastomotic lesion in the LIMA graft.  She has been medically and since then and over the past month or so, she has been noticing increasing angina reminiscent of prior symptoms.  Though she is fairly active and exercises/walks daily, she does not usually experience symptoms with activity but immediately thereafter, she has been having chest burning lower facial/dental pain, similar to prior angina.  We discussed options for management today and will pursue diagnostic catheterization.  The patient understands that risks include but are not limited to stroke (1 in 1000), death (1 in 74), kidney failure [usually temporary] (1 in 500), bleeding (1 in 200), allergic reaction [possibly serious] (1 in 200), and agrees to proceed.   We will follow-up a CBC and basic metabolic panel today.  Adding Imdur 30 mg daily.  Continue aspirin, statin, calcium channel blocker, and ARB.  She does have a contrast allergy and will be pretreated with prednisone 50 mg at 13, 7, and 1 hour prior to catheterization, along with  Benadryl.  2.  Essential hypertension: Blood pressure elevated today at 162/76 and 168/82 on repeat.  She notes that she typically runs more normally and usually does not take her blood pressure medicines until noon.  With elevated pressures and unstable angina, adding Imdur 30 mg daily.  Continue current doses of amlodipine and losartan.  Patient will continue to follow blood pressures at home.  3.  Hyperlipidemia: LDL of 68 in February.  She remains on statin therapy.  4.  Contrast allergy: See #1.  Pretreatment with prednisone 50 mg at 13, 7, and 1 hour prior to diagnostic catheterization, along w/ benadryl.  5.  Disposition: Follow-up CBC and basic metabolic panel today.  Plan for diagnostic catheterization early next week with Dr. Fletcher Anon.  Follow-up in clinic approximate 2 weeks post catheterization.  Murray Hodgkins, NP 09/06/2022, 12:48 PM

## 2022-09-09 ENCOUNTER — Other Ambulatory Visit: Payer: Self-pay

## 2022-09-09 ENCOUNTER — Encounter: Payer: Self-pay | Admitting: Cardiovascular Disease

## 2022-09-09 ENCOUNTER — Ambulatory Visit
Admission: RE | Admit: 2022-09-09 | Discharge: 2022-09-09 | Disposition: A | Discharge status: Home / Self Care | Attending: Cardiovascular Disease | Admitting: Cardiovascular Disease

## 2022-09-09 ENCOUNTER — Encounter
Admission: RE | Disposition: A | Discharge status: Home / Self Care | Payer: Self-pay | Source: Home / Self Care | Attending: Cardiovascular Disease

## 2022-09-09 DIAGNOSIS — Z7982 Long term (current) use of aspirin: Secondary | ICD-10-CM | POA: Insufficient documentation

## 2022-09-09 DIAGNOSIS — I25118 Atherosclerotic heart disease of native coronary artery with other forms of angina pectoris: Secondary | ICD-10-CM

## 2022-09-09 DIAGNOSIS — I10 Essential (primary) hypertension: Secondary | ICD-10-CM | POA: Insufficient documentation

## 2022-09-09 DIAGNOSIS — I2582 Chronic total occlusion of coronary artery: Secondary | ICD-10-CM | POA: Diagnosis not present

## 2022-09-09 DIAGNOSIS — Z91041 Radiographic dye allergy status: Secondary | ICD-10-CM | POA: Diagnosis not present

## 2022-09-09 DIAGNOSIS — E785 Hyperlipidemia, unspecified: Secondary | ICD-10-CM | POA: Diagnosis not present

## 2022-09-09 DIAGNOSIS — Z87891 Personal history of nicotine dependence: Secondary | ICD-10-CM | POA: Diagnosis not present

## 2022-09-09 DIAGNOSIS — Z79899 Other long term (current) drug therapy: Secondary | ICD-10-CM | POA: Insufficient documentation

## 2022-09-09 DIAGNOSIS — Z955 Presence of coronary angioplasty implant and graft: Secondary | ICD-10-CM | POA: Diagnosis not present

## 2022-09-09 DIAGNOSIS — I2510 Atherosclerotic heart disease of native coronary artery without angina pectoris: Secondary | ICD-10-CM

## 2022-09-09 DIAGNOSIS — I25110 Atherosclerotic heart disease of native coronary artery with unstable angina pectoris: Secondary | ICD-10-CM

## 2022-09-09 DIAGNOSIS — Z951 Presence of aortocoronary bypass graft: Secondary | ICD-10-CM | POA: Insufficient documentation

## 2022-09-09 HISTORY — PX: LEFT HEART CATH AND CORS/GRAFTS ANGIOGRAPHY: CATH118250

## 2022-09-09 SURGERY — LEFT HEART CATH AND CORS/GRAFTS ANGIOGRAPHY
Anesthesia: Moderate Sedation | Laterality: Left

## 2022-09-09 MED ORDER — SODIUM CHLORIDE 0.9 % IV SOLN: 250.0000 mL | INTRAVENOUS | Status: DC | PRN

## 2022-09-09 MED ORDER — FENTANYL CITRATE (PF) 100 MCG/2ML IJ SOLN
INTRAMUSCULAR | Status: AC
  Filled 2022-09-09: qty 2

## 2022-09-09 MED ORDER — MIDAZOLAM HCL 2 MG/2ML IJ SOLN
INTRAMUSCULAR | Status: DC | PRN
  Administered 2022-09-09: 1 mg via INTRAVENOUS

## 2022-09-09 MED ORDER — HEPARIN SODIUM (PORCINE) 1000 UNIT/ML IJ SOLN
INTRAMUSCULAR | Status: AC
  Filled 2022-09-09: qty 10

## 2022-09-09 MED ORDER — MIDAZOLAM HCL 2 MG/2ML IJ SOLN
INTRAMUSCULAR | Status: AC
  Filled 2022-09-09: qty 2

## 2022-09-09 MED ORDER — SODIUM CHLORIDE 0.9% FLUSH: 3.0000 mL | INTRAVENOUS | Status: DC | PRN

## 2022-09-09 MED ORDER — ONDANSETRON HCL 4 MG/2ML IJ SOLN: 4.0000 mg | Freq: Four times a day (QID) | INTRAMUSCULAR | Status: DC | PRN

## 2022-09-09 MED ORDER — SODIUM CHLORIDE 0.9% FLUSH: 3.0000 mL | Freq: Two times a day (BID) | INTRAVENOUS | Status: DC

## 2022-09-09 MED ORDER — HEPARIN (PORCINE) IN NACL 1000-0.9 UT/500ML-% IV SOLN
INTRAVENOUS | Status: DC | PRN
  Administered 2022-09-09: 1000 mL

## 2022-09-09 MED ORDER — SODIUM CHLORIDE 0.9 % IV SOLN: INTRAVENOUS | Status: DC

## 2022-09-09 MED ORDER — IOHEXOL 300 MG/ML  SOLN
INTRAMUSCULAR | Status: DC | PRN
  Administered 2022-09-09: 55 mL

## 2022-09-09 MED ORDER — VERAPAMIL HCL 2.5 MG/ML IV SOLN
INTRAVENOUS | Status: AC
  Filled 2022-09-09: qty 2

## 2022-09-09 MED ORDER — ASPIRIN 81 MG PO CHEW
81.0000 mg | CHEWABLE_TABLET | ORAL | Status: AC
  Administered 2022-09-09: 81 mg via ORAL

## 2022-09-09 MED ORDER — LIDOCAINE HCL 1 % IJ SOLN
INTRAMUSCULAR | Status: AC
  Filled 2022-09-09: qty 20

## 2022-09-09 MED ORDER — ACETAMINOPHEN 325 MG PO TABS: 650.0000 mg | ORAL_TABLET | ORAL | Status: DC | PRN

## 2022-09-09 MED ORDER — SODIUM CHLORIDE 0.9 % WEIGHT BASED INFUSION: 1.0000 mL/kg/h | INTRAVENOUS | Status: DC

## 2022-09-09 MED ORDER — HEPARIN (PORCINE) IN NACL 1000-0.9 UT/500ML-% IV SOLN
INTRAVENOUS | Status: AC
  Filled 2022-09-09: qty 1000

## 2022-09-09 MED ORDER — ASPIRIN 81 MG PO CHEW
CHEWABLE_TABLET | ORAL | Status: AC
  Filled 2022-09-09: qty 1

## 2022-09-09 MED ORDER — LIDOCAINE HCL (PF) 1 % IJ SOLN
INTRAMUSCULAR | Status: DC | PRN
  Administered 2022-09-09: 20 mL

## 2022-09-09 MED ORDER — LABETALOL HCL 5 MG/ML IV SOLN: 10.0000 mg | INTRAVENOUS | Status: DC | PRN

## 2022-09-09 MED ORDER — SODIUM CHLORIDE 0.9 % WEIGHT BASED INFUSION
3.0000 mL/kg/h | INTRAVENOUS | Status: AC
  Administered 2022-09-09: 3 mL/kg/h via INTRAVENOUS

## 2022-09-09 MED ORDER — FENTANYL CITRATE (PF) 100 MCG/2ML IJ SOLN
INTRAMUSCULAR | Status: DC | PRN
  Administered 2022-09-09: 50 ug via INTRAVENOUS

## 2022-09-09 SURGICAL SUPPLY — 14 items
CANNULA 5F STIFF (CANNULA) IMPLANT
CATH INFINITI 5 FR IM (CATHETERS) IMPLANT
CATH INFINITI 5FR MULTPACK ANG (CATHETERS) IMPLANT
DEVICE CLOSURE MYNXGRIP 5F (Vascular Products) IMPLANT
DRAPE BRACHIAL (DRAPES) IMPLANT
KIT SYRINGE INJ CVI SPIKEX1 (MISCELLANEOUS) IMPLANT
PACK CARDIAC CATH (CUSTOM PROCEDURE TRAY) ×1 IMPLANT
PROTECTION STATION PRESSURIZED (MISCELLANEOUS) ×1
SET ATX SIMPLICITY (MISCELLANEOUS) IMPLANT
SHEATH AVANTI 5FR X 11CM (SHEATH) IMPLANT
STATION PROTECTION PRESSURIZED (MISCELLANEOUS) IMPLANT
TUBING CIL FLEX 10 FLL-RA (TUBING) IMPLANT
WIRE EMERALD 3MM-J .035X260CM (WIRE) IMPLANT
WIRE GUIDERIGHT .035X150 (WIRE) IMPLANT

## 2022-09-09 NOTE — Interval H&P Note (Signed)
History and Physical Interval Note:  09/09/2022 7:55 AM  Kim Villarreal  has presented today for surgery, with the diagnosis of L Cath w graft   CAD post CABG.  The various methods of treatment have been discussed with the patient and family. After consideration of risks, benefits and other options for treatment, the patient has consented to  Procedure(s): LEFT HEART CATH AND CORS/GRAFTS ANGIOGRAPHY (Left) as a surgical intervention.  The patient's history has been reviewed, patient examined, no change in status, stable for surgery.  I have reviewed the patient's chart and labs.  Questions were answered to the patient's satisfaction.     [name]

## 2022-09-12 ENCOUNTER — Encounter: Payer: Self-pay | Admitting: Cardiovascular Disease

## 2022-09-30 ENCOUNTER — Encounter: Payer: Self-pay | Admitting: Nurse Practitioner

## 2022-09-30 ENCOUNTER — Ambulatory Visit: Attending: Nurse Practitioner | Admitting: Nurse Practitioner

## 2022-09-30 VITALS — BP 150/74 | HR 51 | Ht 66.5 in | Wt 153.0 lb

## 2022-09-30 DIAGNOSIS — I2510 Atherosclerotic heart disease of native coronary artery without angina pectoris: Secondary | ICD-10-CM

## 2022-09-30 DIAGNOSIS — I10 Essential (primary) hypertension: Secondary | ICD-10-CM

## 2022-09-30 DIAGNOSIS — E785 Hyperlipidemia, unspecified: Secondary | ICD-10-CM | POA: Diagnosis not present

## 2022-09-30 NOTE — Progress Notes (Addendum)
Office Visit    Patient Name: Kim Villarreal Date of Encounter: 09/30/2022  Primary Care Provider:  Derinda Late, MD Primary Cardiologist:  [name], MD  Chief Complaint    51 year old female with a history of CAD status post CABG x1 (LIMA  LAD), hypertension, hyperlipidemia, diastolic dysfunction, and palpitations, who presents for follow-up of CAD.  Past Medical History    Past Medical History:  Diagnosis Date   Anemia    "as a child"   Anxiety    Bursitis    Childhood asthma    Chronic lower back pain    Contrast media allergy    a. 09/2016 Cath: lip swelling w/o rash or resp distress->managed w/ solumedrol/benadryl.   Coronary artery disease    a. 09/2016 PCI: LAD 44m(Xience 2.5x158mDES); b. 05/2018 PCI: LAD 90p/m ISR (2.75x18 Resolute Onyx DES); c. 08/2018 Cath: LAD 95p/m ISR; d. 08/2018 CABG x 1: LIMA->LAD; e. 04/2019 Cath: LM nl, LAD 100p/m, LCX min irregs, OM1-3 nl, RCA nl, LIMA->LAD 40% at anastomosis. EF 55-65%; f. 08/2022 Cath: LM nl, LAD 10023mCx min irregs, RCA nl, LIMA->LAD 10 @ insertion. EF 65%->Med Rx.   Diastolic dysfunction    Hypertension    Migraine    "it was a bout; beginning of 2017; for ~ 1 month; went away" (05/23/2018)   Mild Mitral regurgitation    a. 08/2016 Echo: EF 65-70%, nl wall motion, nl LV diastolic fxn, mild MR; b. 12/2020 Echo: EF 60-65%, no rwma, GrI DD, nl RV fxn, RVSP 33.9mm97m Mild MR.   Palpitations    a. 05/2019 Zio: RSR @ 66. Freq PACs, rare PVCs.   Tendonitis    Urticaria    Past Surgical History:  Procedure Laterality Date   CARDIAC CATHETERIZATION N/A 10/05/2016   Procedure: Left Heart Cath and Coronary Angiography;  Surgeon: MuhaWellington Hampshire;  [facility];  Service: Cardiovascular;  Laterality: N/A;   CARDIAC CATHETERIZATION N/A 10/05/2016   Procedure: Coronary Stent Intervention;  Surgeon: MuhaWellington Hampshire;  [facility];  Service: Cardiovascular;  Laterality: N/A;    CESAREAN SECTION  2003; 2005   CORONARY ANGIOPLASTY WITH STENT PLACEMENT  05/23/2018   "2 stents"   CORONARY ARTERY BYPASS GRAFT N/A 08/27/2018   Procedure: CORONARY ARTERY BYPASS GRAFTING (CABG) x 1 using LEFT INTERNAL MAMMARY ARTERY;  Surgeon: BartGaye Pollack;  [facility];  Laterality: N/A;   CORONARY STENT INTERVENTION N/A 05/23/2018   Procedure: CORONARY STENT INTERVENTION;  Surgeon: AridWellington Hampshire;  [facility];  Service: Cardiovascular;  Laterality: N/A;  mid LAD   ECTOPIC PREGNANCY SURGERY  1991   LEFT HEART CATH AND CORONARY ANGIOGRAPHY N/A 05/23/2018   Procedure: LEFT HEART CATH AND CORONARY ANGIOGRAPHY;  Surgeon: AridWellington Hampshire;  [facility];  Service: Cardiovascular;  Laterality: N/A;   LEFT HEART CATH AND CORONARY ANGIOGRAPHY N/A 08/22/2018   Procedure: LEFT HEART CATH AND CORONARY ANGIOGRAPHY;  Surgeon: AridWellington Hampshire;  [facility];  Service: Cardiovascular;  Laterality: N/A;   LEFT HEART CATH AND CORS/GRAFTS ANGIOGRAPHY Left 05/20/2019   Procedure: LEFT HEART CATH AND CORS/GRAFTS ANGIOGRAPHY;  Surgeon: AridWellington Hampshire;  [facility];  Service: Cardiovascular;  Laterality: Left;   LEFT HEART CATH AND CORS/GRAFTS ANGIOGRAPHY Left 09/09/2022   Procedure: LEFT HEART CATH AND CORS/GRAFTS ANGIOGRAPHY;  Surgeon: AridFletcher Anon  Mertie Clause, MD;  Location: [facility];  Service: Cardiovascular;  Laterality: Left;   TEE WITHOUT CARDIOVERSION N/A 08/27/2018   Procedure: TRANSESOPHAGEAL ECHOCARDIOGRAM (TEE);  Surgeon: Gaye Pollack, MD;  Location: [place];  Service: Open Heart Surgery;  Laterality: N/A;    Allergies  Allergies  Allergen Reactions   Codeine Nausea And Vomiting   Contrast Media [Iodinated Contrast Media] Swelling    The patient had mild lip swelling at the end of the case without rash or respiratory distress. This might be due to dye reaction. 2017    Isosorbide Mononitrate [Isosorbide Nitrate]     Headache     History of Present Illness    51 year old female with above past medical history including CAD, hypertension, hyperlipidemia, diastolic dysfunction, and palpitations.  She previously underwent stenting of the proximal-mid LAD in November 2017 with repeat stenting in July 2019, due to in-stent restenosis.  Unfortunately, she had recurrent in-stent restenosis in October 2019 despite dual antiplatelet therapy.  She was evaluated by CT surgery in [facility] and subsequently underwent CABG x1 with a LIMA to the LAD.  She had recurrent angina in June 2020, in the setting of elevated blood pressure with exercise, and diagnostic catheterization was performed and showed an occluded LAD with patent LIMA to LAD with moderate anastomotic disease of 40%.  Echocardiogram in November 2022 showed an EF of 60-65% with grade 1 diastolic dysfunction, RVSP of 33.9 mmHg, and mild MR.  Kim Villarreal was last seen in cardiology clinic last month at which time she reported postexertional substernal chest burning with dental/lower facial pain and or bilateral upper chest discomfort just medial to the axillary areas.  Symptoms were lasting a few minutes and resolved with rest and were similar to prior angina.  In that setting, she underwent diagnostic catheterization which again showed an occluded LAD with patent LIMA to the LAD with 10% anastomotic disease and otherwise mild nonobstructive disease.  EF of 65% and continue medical therapy was advised and isosorbide was discontinued as it was causing headaches.  Since her catheterization, she continued to have mild chest discomfort after activity for about 2 weeks but more recently, has been completely pain-free.  She reports that her right groin is healed up well.  She denies chest pain, dyspnea, palpitations, PND, orthopnea, dizziness, syncope, edema, or early satiety.  Home Medications    Current Outpatient  Medications  Medication Sig Dispense Refill   amLODipine (NORVASC) 5 MG tablet Take 1 tablet (5 mg total) by mouth daily. 30 tablet 2   aspirin 325 MG EC tablet Take 1 tablet (325 mg total) by mouth daily.     estradiol (VIVELLE-DOT) 0.0375 MG/24HR every 3 (three) days.     losartan (COZAAR) 100 MG tablet TAKE 1 TABLET(100 MG) BY MOUTH DAILY 90 tablet 0   Multiple Vitamins-Minerals (MULTIVITAMIN GUMMIES ADULT PO) Take 1 Units by mouth daily.     nitrofurantoin (MACRODANTIN) 25 MG capsule Take 1 capsule (25 mg total) by mouth daily as needed (for UTI prevention). 30 capsule 6   nitroGLYCERIN (NITROSTAT) 0.4 MG SL tablet Place 1 tablet (0.4 mg total) under the tongue every 5 (five) minutes as needed for chest pain (Do not tak more than 3 tablets in any 24 hour period). 25 tablet 0   progesterone (PROMETRIUM) 100 MG capsule Take 100 mg by mouth daily.     rosuvastatin (CRESTOR) 10 MG tablet TAKE 1 TABLET(10 MG) BY MOUTH DAILY 90 tablet 0   Saccharomyces boulardii (  PROBIOTIC) 250 MG CAPS Take 1 capsule by mouth daily.     No current facility-administered medications for this visit.     Review of Systems    She denies chest pain, palpitations, dyspnea, pnd, orthopnea, n, v, dizziness, syncope, edema, weight gain, or early satiety.  All other systems reviewed and are otherwise negative except as noted above.    Physical Exam    VS:  BP (!) 150/74 (BP Location: Left Arm, Patient Position: Sitting, Cuff Size: Normal)   Pulse (!) 51   Ht 5' 6.5" (1.689 m)   Wt 153 lb (69.4 kg)   SpO2 99%   BMI 24.32 kg/m  , BMI Body mass index is 24.32 kg/m.     GEN: Well nourished, well developed, in no acute distress. HEENT: normal. Neck: Supple, no JVD, carotid bruits, or masses. Cardiac: RRR, 2/6 syst murmur @ the upper sternal borders, no rubs or gallops. No clubbing, cyanosis, edema.  Radials/PT 2+ and equal bilaterally.  Respiratory:  Respirations regular and unlabored, clear to auscultation  bilaterally. GI: Soft, nontender, nondistended, BS + x 4. MS: no deformity or atrophy. Skin: warm and dry, no rash. Neuro:  Strength and sensation are intact. Psych: Normal affect.  Accessory Clinical Findings    ECG personally reviewed by me today -sinus bradycardia, 51, septal infarct- no acute changes.  Lab Results  Component Value Date   WBC 4.5 09/06/2022   HGB 14.2 09/06/2022   HCT 44.0 09/06/2022   MCV 86.6 09/06/2022   PLT 173 09/06/2022   Lab Results  Component Value Date   CREATININE 0.94 09/06/2022   BUN 17 09/06/2022   NA 142 09/06/2022   K 4.6 09/06/2022   CL 107 09/06/2022   CO2 26 09/06/2022   Lab Results  Component Value Date   ALT 37 (H) 01/04/2022   AST 29 01/04/2022   ALKPHOS 98 01/04/2022   BILITOT <0.2 01/04/2022   Lab Results  Component Value Date   CHOL 149 01/04/2022   HDL 42 01/04/2022   LDLCALC 68 01/04/2022   LDLDIRECT 61 09/17/2018   TRIG 242 (H) 01/04/2022   CHOLHDL 3.5 01/04/2022    Lab Results  Component Value Date   HGBA1C 5.7 (H) 05/23/2019    Assessment & Plan    1.  Coronary artery disease: Patient with prior history of LAD disease status post tenting in 2010 with restenosis x2 in 2019 and subsequent CABG x1 with LIMA to the LAD in October 2019.  Diagnostic catheterization in 2020 showed a 40% anastomotic lesion at the insertion of the LIMA.  She was seen in mid October with complaints of chest burning similar to prior angina, occurring after periods of activity but not during.  She underwent diagnostic catheterization which showed a 10% anastomotic lesion at the insertion of the LIMA but otherwise nonobstructive disease.  She was initially treated with Imdur following her last visit, but this caused headaches, and was discontinued at the time of her catheterization.  She has been feeling well over the past few weeks without any recurrence of chest pain and has been tolerating activity well.  We discussed catheterization results  today.  Continue medical therapy including aspirin, statin, calcium channel blocker, and ARB.  2.  Essential hypertension: Blood pressure elevated today at 150/74.  She notes that she checks her blood pressure regularly at home and does not routinely take her medications until noon time.  Blood pressures at home are typically in the 120s.  We discussed  the importance of adequate blood pressure control and she will continue to monitor and let us know if she is trending greater than 130.  There is room to titrate her amlodipine if necessary.  She otherwise remains on losartan.  3.  Hyperlipidemia: LDL of 68 in February 2023.  She remains on statin therapy.  4.  Disposition: Follow-up with Dr. Fletcher Anon in 3 to 4 months or sooner if necessary.  Kim Hodgkins, NP 09/30/2022, 10:47 AM

## 2022-09-30 NOTE — Patient Instructions (Signed)
Medication Instructions:   Your physician recommends that you continue on your current medications as directed. Please refer to the Current Medication list given to you today.  *If you need a refill on your cardiac medications before your next appointment, please call your pharmacy*   Lab Work:  None ordered  If you have labs (blood work) drawn today and your tests are completely normal, you will receive your results only by: [place] (if you have MyChart) OR A paper copy in the mail If you have any lab test that is abnormal or we need to change your treatment, we will call you to review the results.   Testing/Procedures:  None Ordered   Follow-Up: At [facility], you and your health needs are our priority.  As part of our continuing mission to provide you with exceptional heart care, we have created designated Provider Care Teams.  These Care Teams include your primary Cardiologist (physician) and Advanced Practice Providers (APPs -  Physician Assistants and Nurse Practitioners) who all work together to provide you with the care you need, when you need it.  We recommend signing up for the patient portal called "MyChart".  Sign up information is provided on this After Visit Summary.  MyChart is used to connect with patients for Virtual Visits (Telemedicine).  Patients are able to view lab/test results, encounter notes, upcoming appointments, etc.  Non-urgent messages can be sent to your provider as well.   To learn more about what you can do with MyChart, go to [email or web address].    Your next appointment:   3 - 4  month(s)  The format for your next appointment:   In Person  Provider:   You may see [name], MD

## 2022-10-22 ENCOUNTER — Other Ambulatory Visit: Payer: Self-pay | Admitting: Cardiovascular Disease

## 2022-10-29 ENCOUNTER — Other Ambulatory Visit: Payer: Self-pay | Admitting: Cardiovascular Disease

## 2022-12-12 ENCOUNTER — Ambulatory Visit (INDEPENDENT_AMBULATORY_CARE_PROVIDER_SITE_OTHER): Payer: Self-pay | Admitting: Physician Assistant

## 2022-12-12 ENCOUNTER — Encounter: Payer: Self-pay | Admitting: Physician Assistant

## 2022-12-12 VITALS — BP 148/90 | HR 54 | Temp 98.4°F | Wt 156.8 lb

## 2022-12-12 DIAGNOSIS — I10 Essential (primary) hypertension: Secondary | ICD-10-CM

## 2022-12-12 DIAGNOSIS — Z20822 Contact with and (suspected) exposure to covid-19: Secondary | ICD-10-CM

## 2022-12-12 LAB — POC COVID19 BINAXNOW: SARS Coronavirus 2 Ag: NEGATIVE

## 2022-12-12 NOTE — Progress Notes (Signed)
[facility] [address], [place] [address]   Office Visit Note  Patient Name: Kim Villarreal Date of Birth [id number]  Medical Record number [id number]  Date of Service: 12/12/2022  Chief Complaint  Patient presents with   Covid Exposure    Was last exposed was Friday. Congested and headache.     52 y/o F presents to the clinic for c/o headache and congestion x 2 days. +known exposure to covid. Denies CP, SOB, chills, fever, or body aches. Hasn't felt bad, but just wanted to rule out. Hasn't taken any medicines currently.       Current Medication:  Outpatient Encounter Medications as of 12/12/2022  Medication Sig   amLODipine (NORVASC) 5 MG tablet TAKE 1 TABLET(5 MG) BY MOUTH DAILY   aspirin 325 MG EC tablet Take 1 tablet (325 mg total) by mouth daily.   estradiol (VIVELLE-DOT) 0.0375 MG/24HR every 3 (three) days.   losartan (COZAAR) 100 MG tablet TAKE 1 TABLET(100 MG) BY MOUTH DAILY   Multiple Vitamins-Minerals (MULTIVITAMIN GUMMIES ADULT PO) Take 1 Units by mouth daily.   progesterone (PROMETRIUM) 100 MG capsule Take 100 mg by mouth daily.   rosuvastatin (CRESTOR) 10 MG tablet TAKE 1 TABLET(10 MG) BY MOUTH DAILY   Saccharomyces boulardii (PROBIOTIC) 250 MG CAPS Take 1 capsule by mouth daily.   nitrofurantoin (MACRODANTIN) 25 MG capsule Take 1 capsule (25 mg total) by mouth daily as needed (for UTI prevention).   nitroGLYCERIN (NITROSTAT) 0.4 MG SL tablet Place 1 tablet (0.4 mg total) under the tongue every 5 (five) minutes as needed for chest pain (Do not tak more than 3 tablets in any 24 hour period).   No facility-administered encounter medications on file as of 12/12/2022.      Medical History: Past Medical History:  Diagnosis Date   Anemia    "as a child"   Anxiety    Bursitis    Childhood asthma    Chronic lower back pain    Contrast media allergy    a. 09/2016 Cath: lip swelling w/o rash or resp distress->managed w/ solumedrol/benadryl.    Coronary artery disease    a. 09/2016 PCI: LAD 97m(Xience 2.5x115mDES); b. 05/2018 PCI: LAD 90p/m ISR (2.75x18 Resolute Onyx DES); c. 08/2018 Cath: LAD 95p/m ISR; d. 08/2018 CABG x 1: LIMA->LAD; e. 04/2019 Cath: LM nl, LAD 100p/m, LCX min irregs, OM1-3 nl, RCA nl, LIMA->LAD 40% at anastomosis. EF 55-65%; f. 08/2022 Cath: LM nl, LAD 10048mCx min irregs, RCA nl, LIMA->LAD 10 @ insertion. EF 65%->Med Rx.   Diastolic dysfunction    Hypertension    Migraine    "it was a bout; beginning of 2017; for ~ 1 month; went away" (05/23/2018)   Mild Mitral regurgitation    a. 08/2016 Echo: EF 65-70%, nl wall motion, nl LV diastolic fxn, mild MR; b. 12/2020 Echo: EF 60-65%, no rwma, GrI DD, nl RV fxn, RVSP 33.9mm30m Mild MR.   Palpitations    a. 05/2019 Zio: RSR @ 66. Freq PACs, rare PVCs.   Tendonitis    Urticaria      Vital Signs: BP (!) 148/90 (BP Location: Left Arm, Patient Position: Sitting, Cuff Size: Normal)   Pulse (!) 54   Temp 98.4 F (36.9 C) (Tympanic)   Wt 156 lb 12.8 oz (71.1 kg)   SpO2 99%   BMI 24.93 kg/m    Review of Systems  Constitutional: Negative.   HENT:  Positive for congestion and postnasal drip.  Negative for sinus pressure, sinus pain, sore throat and trouble swallowing.   Neurological:  Positive for headaches. Negative for dizziness and light-headedness.    Physical Exam Constitutional:      Appearance: Normal appearance.  HENT:     Head: Atraumatic.     Right Ear: Tympanic membrane, ear canal and external ear normal.     Left Ear: Tympanic membrane, ear canal and external ear normal.     Nose: Nose normal.     Mouth/Throat:     Mouth: Mucous membranes are moist.     Pharynx: Oropharynx is clear.  Eyes:     Extraocular Movements: Extraocular movements intact.  Cardiovascular:     Rate and Rhythm: Normal rate and regular rhythm.  Pulmonary:     Effort: Pulmonary effort is normal.     Breath sounds: Normal breath sounds.  Musculoskeletal:     Cervical back:  Neck supple.  Skin:    General: Skin is warm.  Neurological:     Mental Status: She is alert.  Psychiatric:        Mood and Affect: Mood normal.        Behavior: Behavior normal.        Thought Content: Thought content normal.        Judgment: Judgment normal.       Assessment/Plan:  1. Exposure to COVID-19 virus - POC COVID-19 BinaxNow  2. Elevated blood pressure reading in office with diagnosis of hypertension  Reviewed negative covid test result with patient. She verbalized understanding. Recommend to re-test at home tomorrow if symptoms worsen. Stay well hydrated Consider oral anti-histamine ie Zyrtec or Allegra along with fluids and a humidifier.  Pt notified of elevated bp reading at the clinic today.   General Counseling: Kim Villarreal understanding of the findings of todays visit and agrees with plan of treatment. I have discussed any further diagnostic evaluation that may be needed or ordered today. We also reviewed her medications today. she has been encouraged to call the office with any questions or concerns that should arise related to todays visit.    Time spent:[address], [place] Physician Assistant

## 2023-01-27 ENCOUNTER — Ambulatory Visit: Attending: Cardiovascular Disease | Admitting: Cardiovascular Disease

## 2023-01-27 ENCOUNTER — Other Ambulatory Visit: Payer: Self-pay | Admitting: Cardiovascular Disease

## 2023-01-27 ENCOUNTER — Other Ambulatory Visit: Payer: Self-pay

## 2023-01-27 ENCOUNTER — Encounter: Payer: Self-pay | Admitting: Cardiovascular Disease

## 2023-01-27 VITALS — BP 140/52 | HR 53 | Ht 66.0 in | Wt 156.4 lb

## 2023-01-27 DIAGNOSIS — I10 Essential (primary) hypertension: Secondary | ICD-10-CM | POA: Diagnosis not present

## 2023-01-27 DIAGNOSIS — E785 Hyperlipidemia, unspecified: Secondary | ICD-10-CM

## 2023-01-27 DIAGNOSIS — I2510 Atherosclerotic heart disease of native coronary artery without angina pectoris: Secondary | ICD-10-CM | POA: Diagnosis not present

## 2023-01-27 MED ORDER — LOSARTAN POTASSIUM 100 MG PO TABS: ORAL_TABLET | ORAL | 2 refills | Status: DC

## 2023-01-27 NOTE — Progress Notes (Signed)
Cardiology Office Note   Date:  01/27/2023   ID:  Kim Villarreal, DOB 12-20-1970, MRN [id number]  PCP:  Derinda Late, MD  Cardiologist:   [name], MD   Chief Complaint  Patient presents with   3-4 month follow up     Follow up s/p cardiac cath. "Doing well." Medications reviewed by the patient verbally.        History of Present Illness: Kim Villarreal is a 52 y.o. female who presents for a follow-up visit regarding coronary artery disease.  She is a previous smoker . She has no diabetes or hyperlipidemia.  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  She had recurrent angina in June. 2020 in the setting of significantly elevated blood pressure with exercise.  She underwent cardiac catheterization on June 29 which showed occluded proximal LAD with patent LIMA to LAD with moderate anastomosis lesion estimated to be 40%.  EF was normal with mildly elevated left ventricular end-diastolic pressure .  She had strong menopausal symptoms that subsequently responded to hormonal therapy.  She had angina in October and thus repeat cardiac catheterization was performed on October 28 which showed chronically occluded mid LAD stent with patent LIMA to LAD and no other obstructive disease.  The anastomosis area of LIMA to LAD which was moderate on previous cardiac catheterization seems significantly better with minimal stenosis.  Ejection fraction was normal.  She is doing well at the present time with no chest pain, shortness of breath or palpitations.  She exercises on a regular basis.  Past Medical History:  Diagnosis Date   Anemia    "as a child"   Anxiety    Bursitis    Childhood asthma    Chronic lower back pain    Contrast media allergy    a. 09/2016 Cath: lip swelling w/o rash or resp distress->managed w/ solumedrol/benadryl.   Coronary artery disease    a. 09/2016 PCI: LAD 64m(Xience 2.5x160mDES); b.  05/2018 PCI: LAD 90p/m ISR (2.75x18 Resolute Onyx DES); c. 08/2018 Cath: LAD 95p/m ISR; d. 08/2018 CABG x 1: LIMA->LAD; e. 04/2019 Cath: LM nl, LAD 100p/m, LCX min irregs, OM1-3 nl, RCA nl, LIMA->LAD 40% at anastomosis. EF 55-65%; f. 08/2022 Cath: LM nl, LAD 10038mCx min irregs, RCA nl, LIMA->LAD 10 @ insertion. EF 65%->Med Rx.   Diastolic dysfunction    Hypertension    Migraine    "it was a bout; beginning of 2017; for ~ 1 month; went away" (05/23/2018)   Mild Mitral regurgitation    a. 08/2016 Echo: EF 65-70%, nl wall motion, nl LV diastolic fxn, mild MR; b. 12/2020 Echo: EF 60-65%, no rwma, GrI DD, nl RV fxn, RVSP 33.9mm13m Mild MR.   Palpitations    a. 05/2019 Zio: RSR @ 66. Freq PACs, rare PVCs.   Tendonitis    Urticaria     Past Surgical History:  Procedure Laterality Date   CARDIAC CATHETERIZATION N/A 10/05/2016   Procedure: Left Heart Cath and Coronary Angiography;  Surgeon: MuhaWellington Hampshire;  [facility];  Service: Cardiovascular;  Laterality: N/A;   CARDIAC CATHETERIZATION N/A 10/05/2016   Procedure: Coronary Stent Intervention;  Surgeon: MuhaWellington Hampshire;  [facility];  Service: Cardiovascular;  Laterality: N/A;   CESAREAN SECTION  2003; 2005   CORONARY ANGIOPLASTY WITH STENT PLACEMENT  05/23/2018   "2 stents"   CORONARY ARTERY BYPASS  GRAFT N/A 08/27/2018   Procedure: CORONARY ARTERY BYPASS GRAFTING (CABG) x 1 using LEFT INTERNAL MAMMARY ARTERY;  Surgeon: Gaye Pollack, MD;  Location: [facility];  Service: Open Heart Surgery;  Laterality: N/A;   CORONARY STENT INTERVENTION N/A 05/23/2018   Procedure: CORONARY STENT INTERVENTION;  Surgeon: Wellington Hampshire, MD;  Location: [facility];  Service: Cardiovascular;  Laterality: N/A;  mid LAD   ECTOPIC PREGNANCY SURGERY  1991   LEFT HEART CATH AND CORONARY ANGIOGRAPHY N/A 05/23/2018   Procedure: LEFT HEART CATH AND CORONARY ANGIOGRAPHY;  Surgeon: Wellington Hampshire, MD;  Location: [facility];  Service: Cardiovascular;  Laterality: N/A;   LEFT HEART CATH AND CORONARY ANGIOGRAPHY N/A 08/22/2018   Procedure: LEFT HEART CATH AND CORONARY ANGIOGRAPHY;  Surgeon: Wellington Hampshire, MD;  Location: [facility];  Service: Cardiovascular;  Laterality: N/A;   LEFT HEART CATH AND CORS/GRAFTS ANGIOGRAPHY Left 05/20/2019   Procedure: LEFT HEART CATH AND CORS/GRAFTS ANGIOGRAPHY;  Surgeon: Wellington Hampshire, MD;  Location: [facility];  Service: Cardiovascular;  Laterality: Left;   LEFT HEART CATH AND CORS/GRAFTS ANGIOGRAPHY Left 09/09/2022   Procedure: LEFT HEART CATH AND CORS/GRAFTS ANGIOGRAPHY;  Surgeon: Wellington Hampshire, MD;  Location: [facility];  Service: Cardiovascular;  Laterality: Left;   TEE WITHOUT CARDIOVERSION N/A 08/27/2018   Procedure: TRANSESOPHAGEAL ECHOCARDIOGRAM (TEE);  Surgeon: Gaye Pollack, MD;  Location: [facility];  Service: Open Heart Surgery;  Laterality: N/A;     Current Outpatient Medications  Medication Sig Dispense Refill   amLODipine (NORVASC) 5 MG tablet TAKE 1 TABLET(5 MG) BY MOUTH DAILY 30 tablet 2   aspirin 325 MG EC tablet Take 1 tablet (325 mg total) by mouth daily.     estradiol (VIVELLE-DOT) 0.0375 MG/24HR every 3 (three) days.     losartan (COZAAR) 100 MG tablet TAKE 1 TABLET(100 MG) BY MOUTH DAILY 90 tablet 0   Multiple Vitamins-Minerals (MULTIVITAMIN GUMMIES ADULT PO) Take 1 Units by mouth daily.     nitrofurantoin (MACRODANTIN) 25 MG capsule Take 1 capsule (25 mg total) by mouth daily as needed (for UTI prevention). 30 capsule 6   nitroGLYCERIN (NITROSTAT) 0.4 MG SL tablet Place 1 tablet (0.4 mg total) under the tongue every 5 (five) minutes as needed for chest pain (Do not tak more than 3 tablets in any 24 hour period). 25 tablet 0   progesterone (PROMETRIUM) 100 MG capsule Take 100 mg by mouth daily.     rosuvastatin (CRESTOR) 10 MG tablet TAKE 1 TABLET(10 MG) BY MOUTH DAILY 90 tablet 0   Saccharomyces boulardii (PROBIOTIC) 250  MG CAPS Take 1 capsule by mouth daily.     No current facility-administered medications for this visit.    Allergies:   Codeine, Contrast media [iodinated contrast media], and Isosorbide mononitrate [isosorbide nitrate]    Social History:  The patient  reports that she quit smoking about 17 years ago. Her smoking use included cigarettes. She has a 11.25 pack-year smoking history. She has never used smokeless tobacco. She reports current alcohol use. She reports that she does not use drugs.   Family History:  The patient's family history includes Heart disease in her mother; Parkinson's disease in her father.    ROS:  Please see the history of present illness.   Otherwise, review of systems are positive for none.   All other systems are reviewed and negative.    PHYSICAL EXAM: VS:  BP (!) 140/52 (BP  Location: Left Arm, Patient Position: Sitting, Cuff Size: Normal)   Pulse (!) 53   Ht '5\' 6"'$  (1.676 m)   Wt 156 lb 6 oz (70.9 kg)   SpO2 98%   BMI 25.24 kg/m  , BMI Body mass index is 25.24 kg/m. GEN: Well nourished, well developed, in no acute distress  HEENT: normal  Neck: no JVD, carotid bruits, or masses Cardiac: RRR; no  rubs, or gallops,no edema . 1/6 systolic ejection murmur in the aortic area. Respiratory:  clear to auscultation bilaterally, normal work of breathing GI: soft, nontender, nondistended, + BS MS: no deformity or atrophy  Skin: warm and dry, no rash Neuro:  Strength and sensation are intact Psych: euthymic mood, full affect   EKG:  EKG is not ordered today.     Recent Labs: 09/06/2022: BUN 17; Creatinine, Ser 0.94; Hemoglobin 14.2; Platelets 173; Potassium 4.6; Sodium 142    Lipid Panel    Component Value Date/Time   CHOL 149 01/04/2022 1544   TRIG 242 (H) 01/04/2022 1544   HDL 42 01/04/2022 1544   CHOLHDL 3.5 01/04/2022 1544   CHOLHDL 2.8 03/09/2017 0855   VLDL 11 03/09/2017 0855   LDLCALC 68 01/04/2022 1544   LDLDIRECT 61 09/17/2018 1051       Wt Readings from Last 3 Encounters:  01/27/23 156 lb 6 oz (70.9 kg)  12/12/22 156 lb 12.8 oz (71.1 kg)  09/30/22 153 lb (69.4 kg)         08/05/2016   10:11 AM  PAD Screen  Previous PAD dx? No  Previous surgical procedure? No  Pain with walking? No  Feet/toe relief with dangling? No  Painful, non-healing ulcers? No  Extremities discolored? No      ASSESSMENT AND PLAN:  1.  Coronary artery disease involving native coronary arteries without angina: She is status post one-vessel CABG with LIMA to LAD in October 2019.   Cardiac catheterization in October of last year showed no obstructive disease and improvement in the appearance of LIMA to LAD insertion site which is reassuring.  She is currently with no angina. I reviewed the images of the most recent cardiac catheterization with her.  2. Hyperlipidemia: No side effects with rosuvastatin 10 mg once daily.  Most recent lipid profile in February showed an LDL of 68.  3. Essential hypertension: Blood pressure is mildly elevated but usually is controlled.  Continue losartan and amlodipine.   Disposition:   FU with me in 6 months.  Signed,  [name], MD  01/27/2023 9:35 AM    [place]

## 2023-01-27 NOTE — Patient Instructions (Signed)
Medication Instructions:  No changes *If you need a refill on your cardiac medications before your next appointment, please call your pharmacy*   Lab Work: None ordered If you have labs (blood work) drawn today and your tests are completely normal, you will receive your results only by: MyChart Message (if you have MyChart) OR A paper copy in the mail If you have any lab test that is abnormal or we need to change your treatment, we will call you to review the results.   Testing/Procedures: None ordered   Follow-Up: At [facility], you and your health needs are our priority.  As part of our continuing mission to provide you with exceptional heart care, we have created designated Provider Care Teams.  These Care Teams include your primary Cardiologist (physician) and Advanced Practice Providers (APPs -  Physician Assistants and Nurse Practitioners) who all work together to provide you with the care you need, when you need it.  We recommend signing up for the patient portal called "MyChart".  Sign up information is provided on this After Visit Summary.  MyChart is used to connect with patients for Virtual Visits (Telemedicine).  Patients are able to view lab/test results, encounter notes, upcoming appointments, etc.  Non-urgent messages can be sent to your provider as well.   To learn more about what you can do with MyChart, go to [email or web address].com.    Your next appointment:   6 month(s)  Provider:   You may see Muhammad Arida, MD or one of the following Advanced Practice Providers on your designated Care Team:   Christopher Berge, NP Ryan Dunn, PA-C Cadence Furth, PA-C Sheri Hammock, NP    

## 2023-02-20 ENCOUNTER — Other Ambulatory Visit: Payer: Self-pay

## 2023-02-20 MED ORDER — AMLODIPINE BESYLATE 5 MG PO TABS: ORAL_TABLET | ORAL | 5 refills | Status: DC

## 2023-02-20 NOTE — Telephone Encounter (Signed)
Refill for Amlodipine

## 2023-03-22 ENCOUNTER — Other Ambulatory Visit: Payer: Self-pay | Admitting: *Deleted

## 2023-03-22 ENCOUNTER — Other Ambulatory Visit: Payer: Self-pay | Admitting: Family Medicine

## 2023-03-22 DIAGNOSIS — Z1231 Encounter for screening mammogram for malignant neoplasm of breast: Secondary | ICD-10-CM

## 2023-03-22 MED ORDER — ROSUVASTATIN CALCIUM 10 MG PO TABS: 10.0000 mg | ORAL_TABLET | Freq: Every day | ORAL | 1 refills | Status: DC

## 2023-03-22 MED ORDER — LOSARTAN POTASSIUM 100 MG PO TABS: ORAL_TABLET | ORAL | 1 refills | Status: DC

## 2023-03-22 MED ORDER — AMLODIPINE BESYLATE 5 MG PO TABS: ORAL_TABLET | ORAL | 1 refills | Status: DC

## 2023-03-27 ENCOUNTER — Encounter

## 2023-04-19 ENCOUNTER — Encounter

## 2023-05-02 ENCOUNTER — Ambulatory Visit
Admission: RE | Admit: 2023-05-02 | Discharge: 2023-05-02 | Disposition: A | Discharge status: Home / Self Care | Source: Ambulatory Visit | Attending: Family Medicine | Admitting: Family Medicine

## 2023-05-02 DIAGNOSIS — Z1231 Encounter for screening mammogram for malignant neoplasm of breast: Secondary | ICD-10-CM | POA: Diagnosis present

## 2023-09-11 ENCOUNTER — Other Ambulatory Visit: Payer: Self-pay | Admitting: Cardiovascular Disease

## 2023-09-11 NOTE — Telephone Encounter (Signed)
Please contact pt for future appointment. 

## 2023-09-15 ENCOUNTER — Other Ambulatory Visit: Payer: Self-pay | Admitting: Cardiovascular Disease

## 2023-09-15 NOTE — Telephone Encounter (Signed)
Left voicemail to schedule follow up appointment

## 2023-09-15 NOTE — Telephone Encounter (Signed)
Good Morning,   Could you please schedule this patient a 6 month follow up visit? The patient was last seen by Dr. Kirke Corin on 01-27-23. Thank you so much.

## 2023-09-15 NOTE — Telephone Encounter (Signed)
Left vm for patient to call and schedule

## 2023-09-26 ENCOUNTER — Encounter: Payer: Self-pay | Admitting: Cardiovascular Disease

## 2023-11-27 ENCOUNTER — Other Ambulatory Visit: Payer: Self-pay | Admitting: Cardiovascular Disease

## 2024-03-13 ENCOUNTER — Other Ambulatory Visit: Payer: Self-pay | Admitting: Cardiovascular Disease

## 2024-03-24 ENCOUNTER — Other Ambulatory Visit: Payer: Self-pay | Admitting: Cardiovascular Disease

## 2024-04-23 ENCOUNTER — Encounter: Payer: Self-pay | Admitting: Cardiovascular Disease

## 2024-04-23 ENCOUNTER — Ambulatory Visit: Attending: Cardiovascular Disease | Admitting: Cardiovascular Disease

## 2024-04-23 VITALS — BP 168/84 | HR 62 | Ht 66.0 in | Wt 169.5 lb

## 2024-04-23 DIAGNOSIS — I10 Essential (primary) hypertension: Secondary | ICD-10-CM

## 2024-04-23 DIAGNOSIS — E785 Hyperlipidemia, unspecified: Secondary | ICD-10-CM

## 2024-04-23 DIAGNOSIS — Z79899 Other long term (current) drug therapy: Secondary | ICD-10-CM | POA: Diagnosis not present

## 2024-04-23 DIAGNOSIS — I25118 Atherosclerotic heart disease of native coronary artery with other forms of angina pectoris: Secondary | ICD-10-CM | POA: Diagnosis not present

## 2024-04-23 MED ORDER — LOSARTAN POTASSIUM-HCTZ 100-12.5 MG PO TABS: 1.0000 | ORAL_TABLET | Freq: Every day | ORAL | 3 refills | Status: DC

## 2024-04-23 NOTE — Progress Notes (Signed)
 Cardiology Office Note   Date:  04/23/2024   ID:  Kim Villarreal, DOB 1971/02/26, MRN [id number]  PCP:  Kim Banter, MD  Cardiologist:   Antionette Kirks, MD   Chief Complaint  Patient presents with   Follow-up    OD 6 month f/u c/o of phantom symptoms possibly PTSD/ Elevated BP. Meds reviewed verbally with pt.       History of Present Illness: Kim Villarreal is a 53 y.o. female who presents for a follow-up visit regarding coronary artery disease.  She is a previous smoker . She has no diabetes or hyperlipidemia.  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  She had recurrent angina in June. 2020 in the setting of significantly elevated blood pressure with exercise.  She underwent cardiac catheterization on June 29 which showed occluded proximal LAD with patent LIMA to LAD with moderate anastomosis lesion estimated to be 40%.  EF was normal with mildly elevated left ventricular end-diastolic pressure .  She had strong menopausal symptoms that subsequently responded to hormonal therapy.  She had recurrent angina in 2023 thus repeat cardiac catheterization was performed which showed chronically occluded mid LAD stent with patent LIMA to LAD and no other obstructive disease.  The anastomosis area of LIMA to LAD which was moderate on previous cardiac catheterization seemed significantly better with minimal stenosis.  Ejection fraction was normal.  She reports recent episodes of chest discomfort and burning sensation in the setting of elevated blood pressure.  She continues to exercise on a regular basis with no exertional symptoms.  Past Medical History:  Diagnosis Date   Anemia    "as a child"   Anxiety    Bursitis    Childhood asthma    Chronic lower back pain    Contrast media allergy    a. 09/2016 Cath: lip swelling w/o rash or resp distress->managed w/ solumedrol/benadryl .   Coronary artery disease    a.  09/2016 PCI: LAD 26m (Xience 2.5x8mm DES); b. 05/2018 PCI: LAD 90p/m ISR (2.75x18 Resolute Onyx DES); c. 08/2018 Cath: LAD 95p/m ISR; d. 08/2018 CABG x 1: LIMA->LAD; e. 04/2019 Cath: LM nl, LAD 100p/m, LCX min irregs, OM1-3 nl, RCA nl, LIMA->LAD 40% at anastomosis. EF 55-65%; f. 08/2022 Cath: LM nl, LAD 132m, LCx min irregs, RCA nl, LIMA->LAD 10 @ insertion. EF 65%->Med Rx.   Diastolic dysfunction    Hypertension    Migraine    "it was a bout; beginning of 2017; for ~ 1 month; went away" (05/23/2018)   Mild Mitral regurgitation    a. 08/2016 Echo: EF 65-70%, nl wall motion, nl LV diastolic fxn, mild MR; b. 12/2020 Echo: EF 60-65%, no rwma, GrI DD, nl RV fxn, RVSP 33.35mmHg. Mild MR.   Palpitations    a. 05/2019 Zio: RSR @ 66. Freq PACs, rare PVCs.   Tendonitis    Urticaria     Past Surgical History:  Procedure Laterality Date   CARDIAC CATHETERIZATION N/A 10/05/2016   Procedure: Left Heart Cath and Coronary Angiography;  Surgeon: Wenona Hamilton, MD;  [facility];  Service: Cardiovascular;  Laterality: N/A;   CARDIAC CATHETERIZATION N/A 10/05/2016   Procedure: Coronary Stent Intervention;  Surgeon: Wenona Hamilton, MD;  [facility];  Service: Cardiovascular;  Laterality: N/A;   CESAREAN SECTION  2003; 2005   CORONARY ANGIOPLASTY WITH STENT PLACEMENT  05/23/2018   "2 stents"   CORONARY  ARTERY BYPASS GRAFT N/A 08/27/2018   Procedure: CORONARY ARTERY BYPASS GRAFTING (CABG) x 1 using LEFT INTERNAL MAMMARY ARTERY;  Surgeon: Bartley Lightning, MD;  Location: [facility];  Service: Open Heart Surgery;  Laterality: N/A;   CORONARY STENT INTERVENTION N/A 05/23/2018   Procedure: CORONARY STENT INTERVENTION;  Surgeon: Wenona Hamilton, MD;  [facility];  Service: Cardiovascular;  Laterality: N/A;  mid LAD   ECTOPIC PREGNANCY SURGERY  1991   LEFT HEART CATH AND CORONARY ANGIOGRAPHY N/A 05/23/2018   Procedure: LEFT HEART CATH AND CORONARY ANGIOGRAPHY;  Surgeon:  Wenona Hamilton, MD;  [facility];  Service: Cardiovascular;  Laterality: N/A;   LEFT HEART CATH AND CORONARY ANGIOGRAPHY N/A 08/22/2018   Procedure: LEFT HEART CATH AND CORONARY ANGIOGRAPHY;  Surgeon: Wenona Hamilton, MD;  [facility];  Service: Cardiovascular;  Laterality: N/A;   LEFT HEART CATH AND CORS/GRAFTS ANGIOGRAPHY Left 05/20/2019   Procedure: LEFT HEART CATH AND CORS/GRAFTS ANGIOGRAPHY;  Surgeon: Wenona Hamilton, MD;  Location: [facility];  Service: Cardiovascular;  Laterality: Left;   LEFT HEART CATH AND CORS/GRAFTS ANGIOGRAPHY Left 09/09/2022   Procedure: LEFT HEART CATH AND CORS/GRAFTS ANGIOGRAPHY;  Surgeon: Wenona Hamilton, MD;  Location: [facility];  Service: Cardiovascular;  Laterality: Left;   TEE WITHOUT CARDIOVERSION N/A 08/27/2018   Procedure: TRANSESOPHAGEAL ECHOCARDIOGRAM (TEE);  Surgeon: Bartley Lightning, MD;  Location: [facility] OR;  Service: Open Heart Surgery;  Laterality: N/A;     Current Outpatient Medications  Medication Sig Dispense Refill   amLODipine  (NORVASC ) 5 MG tablet TAKE 1 TABLET DAILY (CALL OFFICE TO SCHEDULE AN APPOINTMENT PRIOR TO NEXT REFILL REQUEST) 30 tablet 0   aspirin  325 MG EC tablet Take 1 tablet (325 mg total) by mouth daily.     Bacillus Coagulans-Inulin (PROBIOTIC-PREBIOTIC PO) Take by mouth daily in the afternoon.     estradiol (CLIMARA - DOSED IN MG/24 HR) 0.075 mg/24hr patch Place 0.075 mg onto the skin every 3 (three) days.     losartan  (COZAAR ) 100 MG tablet TAKE 1 TABLET DAILY 90 tablet 3   Multiple Vitamins-Minerals (MULTIVITAMIN GUMMIES ADULT PO) Take 1 Units by mouth daily.     nitroGLYCERIN  (NITROSTAT ) 0.4 MG SL tablet Place 1 tablet (0.4 mg total) under the tongue every 5 (five) minutes as needed for chest pain (Do not tak more than 3 tablets in any 24 hour period). 25 tablet 0   NON FORMULARY DIM PRO ONCE DAILY.     Prasterone, DHEA, (DHEA PO) Take 5 mg by mouth. DAILY.      progesterone (PROMETRIUM) 200 MG capsule Take 400 mg by mouth daily.     Saccharomyces boulardii (PROBIOTIC) 250 MG CAPS Take 1 capsule by mouth daily.     Vitamin D , Ergocalciferol , (DRISDOL) 1.25 MG (50000 UNIT) CAPS capsule Take 5,000 Units by mouth daily at 8 pm.     VITAMIN K PO Take 300 mg by mouth daily in the afternoon.     No current facility-administered medications for this visit.    Allergies:   Codeine, Contrast media [iodinated contrast media], and Isosorbide  mononitrate [isosorbide  nitrate]    Social History:  The patient  reports that she quit smoking about 18 years ago. Her smoking use included cigarettes. She started smoking about 33 years ago. She has a 11.3 pack-year smoking history. She has never used smokeless tobacco. She reports current alcohol use. She reports that she does not use  drugs.   Family History:  The patient's family history includes Heart disease in her mother; Parkinson's disease in her father.    ROS:  Please see the history of present illness.   Otherwise, review of systems are positive for none.   All other systems are reviewed and negative.    PHYSICAL EXAM: VS:  BP (!) 168/84 (BP Location: Left Arm, Patient Position: Sitting, Cuff Size: Normal)   Pulse 62   Ht 5\' 6"  (1.676 m)   Wt 169 lb 8 oz (76.9 kg)   SpO2 99%   BMI 27.36 kg/m  , BMI Body mass index is 27.36 kg/m. GEN: Well nourished, well developed, in no acute distress  HEENT: normal  Neck: no JVD, carotid bruits, or masses Cardiac: RRR; no  rubs, or gallops,no edema . 1/6 systolic ejection murmur in the aortic area. Respiratory:  clear to auscultation bilaterally, normal work of breathing GI: soft, nontender, nondistended, + BS MS: no deformity or atrophy  Skin: warm and dry, no rash Neuro:  Strength and sensation are intact Psych: euthymic mood, full affect   EKG:  EKG is  ordered today. EKG showed: Normal sinus rhythm Normal ECG When compared with ECG of 10-May-2019  23:10, Premature ventricular complexes are no longer Present Criteria for Septal infarct are no longer Present      Recent Labs: No results found for requested labs within last 365 days.    Lipid Panel    Component Value Date/Time   CHOL 149 01/04/2022 1544   TRIG 242 (H) 01/04/2022 1544   HDL 42 01/04/2022 1544   CHOLHDL 3.5 01/04/2022 1544   CHOLHDL 2.8 03/09/2017 0855   VLDL 11 03/09/2017 0855   LDLCALC 68 01/04/2022 1544   LDLDIRECT 61 09/17/2018 1051      Wt Readings from Last 3 Encounters:  04/23/24 169 lb 8 oz (76.9 kg)  01/27/23 156 lb 6 oz (70.9 kg)  12/12/22 156 lb 12.8 oz (71.1 kg)         08/05/2016   10:11 AM  PAD Screen  Previous PAD dx? No  Previous surgical procedure? No  Pain with walking? No  Feet/toe relief with dangling? No  Painful, non-healing ulcers? No  Extremities discolored? No      ASSESSMENT AND PLAN:  1.  Coronary artery disease involving native coronary arteries with other forms of angina: Vague chest discomfort different from prior angina and seems to be in the setting of uncontrolled hypertension.  EKG with no ischemic changes.  Most recent cardiac catheterization in 2023 showed no obstructive disease and improvement in the appearance of LIMA to LAD insertion site which is reassuring.   If symptoms persist after controlling blood pressure, will consider stress testing.  2. Hyperlipidemia: No side effects with rosuvastatin  10 mg once daily.  I requested lipid and liver profile.  3. Essential hypertension: Blood pressure has been uncontrolled.  I elected to add small dose hydrochlorothiazide  to losartan  and will check routine labs in 1 week.  Beta-blockers were associated with bradycardia in the past and isosorbide  was associated with headaches.   Disposition:   FU with me in 3 months.  Signed,  Antionette Kirks, MD  04/23/2024 2:43 PM    [facility]

## 2024-04-23 NOTE — Patient Instructions (Signed)
 Medication Instructions:  Your physician recommends the following medication changes.  STOP TAKING: Losartan  100mg  by mouth daily  START TAKING: Losartan -Hydrochlorothiazide  100-12.5mg  by mouth daily   *If you need a refill on your cardiac medications before your next appointment, please call your pharmacy*  Lab Work: Your provider would like for you to return in 1 week to have the following labs drawn: CBC, CMP, Lipid Panel, TSH.   Please go to [facility] [address] ([facility] [address], [place] [address] You do not need an appointment.  They are open from 8 am- 4:30 pm.  Lunch from 1:00 pm- 2:00 pm You will need to be fasting.  If you have labs (blood work) drawn today and your tests are completely normal, you will receive your results only by: MyChart Message (if you have MyChart) OR A paper copy in the mail If you have any lab test that is abnormal or we need to change your treatment, we will call you to review the results.  Testing/Procedures:  No test ordered today   Follow-Up: At [facility], you and your health needs are our priority.  As part of our continuing mission to provide you with exceptional heart care, our providers are all part of one team.  This team includes your primary Cardiologist (physician) and Advanced Practice Providers or APPs (Physician Assistants and Nurse Practitioners) who all work together to provide you with the care you need, when you need it.  Your next appointment:   3 month(s)  Provider:   Antionette Kirks, MD   We recommend signing up for the patient portal called "MyChart".  Sign up information is provided on this After Visit Summary.  MyChart is used to connect with patients for Virtual Visits (Telemedicine).  Patients are able to view lab/test results, encounter notes, upcoming appointments, etc.  Non-urgent messages can be sent to your provider as well.   To learn more about what you can do with  MyChart, go to [email or web address].

## 2024-04-24 ENCOUNTER — Other Ambulatory Visit: Payer: Self-pay

## 2024-04-24 ENCOUNTER — Encounter: Payer: Self-pay | Admitting: Cardiovascular Disease

## 2024-04-24 MED ORDER — AMLODIPINE BESYLATE 5 MG PO TABS
5.0000 mg | ORAL_TABLET | Freq: Every day | ORAL | 3 refills | Status: DC
Start: 2024-04-24 — End: 2024-05-02

## 2024-04-24 MED ORDER — AMLODIPINE BESYLATE 5 MG PO TABS: 5.0000 mg | ORAL_TABLET | Freq: Every day | ORAL | 3 refills | Status: DC

## 2024-04-24 NOTE — Progress Notes (Signed)
 RX amlodipine  5 mg

## 2024-04-24 NOTE — Addendum Note (Signed)
 Addended by: Afshin Chrystal D on: 04/24/2024 09:51 AM   Modules accepted: Orders

## 2024-05-02 ENCOUNTER — Other Ambulatory Visit: Payer: Self-pay

## 2024-05-02 ENCOUNTER — Other Ambulatory Visit
Admission: RE | Admit: 2024-05-02 | Discharge: 2024-05-02 | Disposition: A | Discharge status: Home / Self Care | Attending: Cardiovascular Disease | Admitting: Cardiovascular Disease

## 2024-05-02 DIAGNOSIS — Z79899 Other long term (current) drug therapy: Secondary | ICD-10-CM | POA: Insufficient documentation

## 2024-05-02 DIAGNOSIS — E785 Hyperlipidemia, unspecified: Secondary | ICD-10-CM | POA: Diagnosis not present

## 2024-05-02 DIAGNOSIS — I10 Essential (primary) hypertension: Secondary | ICD-10-CM | POA: Diagnosis present

## 2024-05-02 LAB — COMPREHENSIVE METABOLIC PANEL WITH GFR
ALT: 41 U/L (ref 0–44)
AST: 27 U/L (ref 15–41)
Albumin: 4.1 g/dL (ref 3.5–5.0)
Alkaline Phosphatase: 45 U/L (ref 38–126)
Anion gap: 7 (ref 5–15)
BUN: 15 mg/dL (ref 6–20)
CO2: 26 mmol/L (ref 22–32)
Calcium: 9.9 mg/dL (ref 8.9–10.3)
Chloride: 103 mmol/L (ref 98–111)
Creatinine, Ser: 0.88 mg/dL (ref 0.44–1.00)
GFR, Estimated: >60 mL/min (ref >60)
Glucose, Bld: 100 mg/dL — ABNORMAL HIGH (ref 70–99)
Potassium: 4.1 mmol/L (ref 3.5–5.1)
Sodium: 136 mmol/L (ref 135–145)
Total Bilirubin: 0.6 mg/dL (ref 0.0–1.2)
Total Protein: 7.1 g/dL (ref 6.5–8.1)

## 2024-05-02 LAB — LIPID PANEL
Cholesterol: 217 mg/dL — ABNORMAL HIGH (ref 0–200)
HDL: 46 mg/dL (ref >40)
LDL Cholesterol: 145 mg/dL — ABNORMAL HIGH (ref 0–99)
Total CHOL/HDL Ratio: 4.7 ratio
Triglycerides: 132 mg/dL (ref <150)
VLDL: 26 mg/dL (ref 0–40)

## 2024-05-02 LAB — CBC
HCT: 45 % (ref 36.0–46.0)
Hemoglobin: 14.6 g/dL (ref 12.0–15.0)
MCH: 27.8 pg (ref 26.0–34.0)
MCHC: 32.4 g/dL (ref 30.0–36.0)
MCV: 85.6 fL (ref 80.0–100.0)
Platelets: 184 10*3/uL (ref 150–400)
RBC: 5.26 MIL/uL — ABNORMAL HIGH (ref 3.87–5.11)
RDW: 13.4 % (ref 11.5–15.5)
WBC: 5.8 10*3/uL (ref 4.0–10.5)
nRBC: 0 % (ref 0.0–0.2)

## 2024-05-02 LAB — TSH: TSH: 1.238 u[IU]/mL (ref 0.350–4.500)

## 2024-05-02 MED ORDER — LOSARTAN POTASSIUM-HCTZ 100-12.5 MG PO TABS: 1.0000 | ORAL_TABLET | Freq: Every day | ORAL | 3 refills | Status: DC

## 2024-05-02 MED ORDER — AMLODIPINE BESYLATE 5 MG PO TABS: 5.0000 mg | ORAL_TABLET | Freq: Every day | ORAL | 3 refills | Status: AC

## 2024-05-03 ENCOUNTER — Ambulatory Visit: Payer: Self-pay | Admitting: Cardiovascular Disease

## 2024-05-24 LAB — COLOGUARD: COLOGUARD: NEGATIVE

## 2024-07-25 ENCOUNTER — Encounter: Payer: Self-pay | Admitting: Cardiovascular Disease

## 2024-07-25 ENCOUNTER — Ambulatory Visit: Attending: Cardiovascular Disease | Admitting: Cardiovascular Disease

## 2024-07-25 VITALS — BP 138/60 | HR 68 | Ht 66.5 in | Wt 172.1 lb

## 2024-07-25 DIAGNOSIS — I25118 Atherosclerotic heart disease of native coronary artery with other forms of angina pectoris: Secondary | ICD-10-CM

## 2024-07-25 DIAGNOSIS — E785 Hyperlipidemia, unspecified: Secondary | ICD-10-CM

## 2024-07-25 DIAGNOSIS — I10 Essential (primary) hypertension: Secondary | ICD-10-CM

## 2024-07-25 MED ORDER — TELMISARTAN-HCTZ 80-12.5 MG PO TABS: 1.0000 | ORAL_TABLET | Freq: Every day | ORAL | 1 refills | Status: DC

## 2024-07-25 NOTE — Progress Notes (Signed)
 Cardiology Office Note   Date:  07/25/2024   ID:  Kim Villarreal, DOB [birth year], MRN [id number]  PCP:  Diedra Lame, MD  Cardiologist:   Deatrice Cage, MD   Chief Complaint  Patient presents with   Follow-up    3 month f/u discuss new medication change Telmisartan  80 mg/hydrochlorothiazide  12.5 mg. Meds reviewed verbally with pt.       History of Present Illness: Kim Villarreal is a 53 y.o. female who presents for a follow-up visit regarding coronary artery disease.  She is a previous smoker . She has no diabetes or hyperlipidemia.  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  She had recurrent angina in June. 2020 in the setting of significantly elevated blood pressure with exercise.  She underwent cardiac catheterization on June 29 which showed occluded proximal LAD with patent LIMA to LAD with moderate anastomosis lesion estimated to be 40%.  EF was normal with mildly elevated left ventricular end-diastolic pressure .  She had strong menopausal symptoms that subsequently responded to hormonal therapy.  She had recurrent angina in 2023 thus repeat cardiac catheterization was performed which showed chronically occluded mid LAD stent with patent LIMA to LAD and no other obstructive disease.  The anastomosis area of LIMA to LAD which was moderate on previous cardiac catheterization seemed significantly better with minimal stenosis.  Ejection fraction was normal. She was seen last month and reported some episodes of chest pain in the setting of elevated blood pressure.  I added small dose hydrochlorothiazide  to losartan .  She had some improvement in blood pressure but still not optimal.  No further episodes of chest pain or shortness of breath.  Past Medical History:  Diagnosis Date   Anemia    as a child   Anxiety    Bursitis    Childhood asthma    Chronic lower back pain    Contrast media allergy    a.  09/2016 Cath: lip swelling w/o rash or resp distress->managed w/ solumedrol/benadryl .   Coronary artery disease    a. 09/2016 PCI: LAD 46m (Xience 2.5x29mm DES); b. 05/2018 PCI: LAD 90p/m ISR (2.75x18 Resolute Onyx DES); c. 08/2018 Cath: LAD 95p/m ISR; d. 08/2018 CABG x 1: LIMA->LAD; e. 04/2019 Cath: LM nl, LAD 100p/m, LCX min irregs, OM1-3 nl, RCA nl, LIMA->LAD 40% at anastomosis. EF 55-65%; f. 08/2022 Cath: LM nl, LAD 137m, LCx min irregs, RCA nl, LIMA->LAD 10 @ insertion. EF 65%->Med Rx.   Diastolic dysfunction    Hypertension    Migraine    it was a bout; beginning of 2017; for ~ 1 month; went away (05/23/2018)   Mild Mitral regurgitation    a. 08/2016 Echo: EF 65-70%, nl wall motion, nl LV diastolic fxn, mild MR; b. 12/2020 Echo: EF 60-65%, no rwma, GrI DD, nl RV fxn, RVSP 33.68mmHg. Mild MR.   Palpitations    a. 05/2019 Zio: RSR @ 66. Freq PACs, rare PVCs.   Tendonitis    Urticaria     Past Surgical History:  Procedure Laterality Date   CARDIAC CATHETERIZATION N/A 10/05/2016   Procedure: Left Heart Cath and Coronary Angiography;  Surgeon: Deatrice DELENA Cage, MD;  [facility];  Service: Cardiovascular;  Laterality: N/A;   CARDIAC CATHETERIZATION N/A 10/05/2016   Procedure: Coronary Stent Intervention;  Surgeon: Deatrice DELENA Cage, MD;  [facility]: [facility];  Service: Cardiovascular;  Laterality: N/A;   CESAREAN SECTION  2003; 2005   CORONARY ANGIOPLASTY WITH STENT PLACEMENT  05/23/2018   2 stents   CORONARY ARTERY BYPASS GRAFT N/A 08/27/2018   Procedure: CORONARY ARTERY BYPASS GRAFTING (CABG) x 1 using LEFT INTERNAL MAMMARY ARTERY;  Surgeon: Lucas Dorise POUR, MD;  Location: [facility];  Service: Open Heart Surgery;  Laterality: N/A;   CORONARY STENT INTERVENTION N/A 05/23/2018   Procedure: CORONARY STENT INTERVENTION;  Surgeon: Darron Deatrice LABOR, MD;  [facility];  Service: Cardiovascular;  Laterality: N/A;  mid LAD   ECTOPIC PREGNANCY SURGERY  1991   LEFT  HEART CATH AND CORONARY ANGIOGRAPHY N/A 05/23/2018   Procedure: LEFT HEART CATH AND CORONARY ANGIOGRAPHY;  Surgeon: Darron Deatrice LABOR, MD;  [facility];  Service: Cardiovascular;  Laterality: N/A;   LEFT HEART CATH AND CORONARY ANGIOGRAPHY N/A 08/22/2018   Procedure: LEFT HEART CATH AND CORONARY ANGIOGRAPHY;  Surgeon: Darron Deatrice LABOR, MD;  [facility];  Service: Cardiovascular;  Laterality: N/A;   LEFT HEART CATH AND CORS/GRAFTS ANGIOGRAPHY Left 05/20/2019   Procedure: LEFT HEART CATH AND CORS/GRAFTS ANGIOGRAPHY;  Surgeon: Darron Deatrice LABOR, MD;  Location: [facility];  Service: Cardiovascular;  Laterality: Left;   LEFT HEART CATH AND CORS/GRAFTS ANGIOGRAPHY Left 09/09/2022   Procedure: LEFT HEART CATH AND CORS/GRAFTS ANGIOGRAPHY;  Surgeon: Darron Deatrice LABOR, MD;  Location: [facility];  Service: Cardiovascular;  Laterality: Left;   TEE WITHOUT CARDIOVERSION N/A 08/27/2018   Procedure: TRANSESOPHAGEAL ECHOCARDIOGRAM (TEE);  Surgeon: Lucas Dorise POUR, MD;  Location: [facility];  Service: Open Heart Surgery;  Laterality: N/A;     Current Outpatient Medications  Medication Sig Dispense Refill   amLODipine  (NORVASC ) 5 MG tablet Take 1 tablet (5 mg total) by mouth daily. 90 tablet 3   aspirin  325 MG EC tablet Take 1 tablet (325 mg total) by mouth daily.     Bacillus Coagulans-Inulin (PROBIOTIC-PREBIOTIC PO) Take by mouth daily in the afternoon.     estradiol (CLIMARA - DOSED IN MG/24 HR) 0.075 mg/24hr patch Place 0.075 mg onto the skin every 3 (three) days.     Multiple Vitamins-Minerals (MULTIVITAMIN GUMMIES ADULT PO) Take 1 Units by mouth daily.     nitroGLYCERIN  (NITROSTAT ) 0.4 MG SL tablet Place 1 tablet (0.4 mg total) under the tongue every 5 (five) minutes as needed for chest pain (Do not tak more than 3 tablets in any 24 hour period). 25 tablet 0   NON FORMULARY DIM PRO ONCE DAILY.     Prasterone, DHEA, (DHEA PO) Take 5 mg by mouth. DAILY.      progesterone (PROMETRIUM) 200 MG capsule Take 400 mg by mouth daily.     rosuvastatin  (CRESTOR ) 10 MG tablet Take 10 mg by mouth daily.     Saccharomyces boulardii (PROBIOTIC) 250 MG CAPS Take 1 capsule by mouth daily.     telmisartan -hydrochlorothiazide  (MICARDIS  HCT) 80-12.5 MG tablet Take 1 tablet by mouth daily. 90 tablet 1   Vitamin D , Ergocalciferol , (DRISDOL) 1.25 MG (50000 UNIT) CAPS capsule Take 5,000 Units by mouth daily at 8 pm.     VITAMIN K PO Take 300 mg by mouth daily in the afternoon.     No current facility-administered medications for this visit.    Allergies:   Codeine, Contrast media [iodinated contrast media], and Isosorbide  mononitrate [isosorbide  nitrate]    Social History:  The patient  reports that she quit smoking about 18 years ago. Her smoking use included cigarettes. She started  smoking about 33 years ago. She has a 11.3 pack-year smoking history. She has never used smokeless tobacco. She reports current alcohol use. She reports that she does not use drugs.   Family History:  The patient's family history includes Heart disease in her mother; Parkinson's disease in her father.    ROS:  Please see the history of present illness.   Otherwise, review of systems are positive for none.   All other systems are reviewed and negative.    PHYSICAL EXAM: VS:  BP 138/60 (BP Location: Left Arm, Patient Position: Sitting, Cuff Size: Normal)   Pulse 68   Ht 5' 6.5 (1.689 m)   Wt 172 lb 2 oz (78.1 kg)   SpO2 99%   BMI 27.37 kg/m  , BMI Body mass index is 27.37 kg/m. GEN: Well nourished, well developed, in no acute distress  HEENT: normal  Neck: no JVD, carotid bruits, or masses Cardiac: RRR; no  rubs, or gallops,no edema . 1/6 systolic ejection murmur in the aortic area. Respiratory:  clear to auscultation bilaterally, normal work of breathing GI: soft, nontender, nondistended, + BS MS: no deformity or atrophy  Skin: warm and dry, no rash Neuro:  Strength and  sensation are intact Psych: euthymic mood, full affect   EKG:  EKG is not ordered today. EKG showed:      Recent Labs: 05/02/2024: ALT 41; BUN 15; Creatinine, Ser 0.88; Hemoglobin 14.6; Platelets 184; Potassium 4.1; Sodium 136; TSH 1.238    Lipid Panel    Component Value Date/Time   CHOL 217 (H) 05/02/2024 0845   CHOL 149 01/04/2022 1544   TRIG 132 05/02/2024 0845   HDL 46 05/02/2024 0845   HDL 42 01/04/2022 1544   CHOLHDL 4.7 05/02/2024 0845   VLDL 26 05/02/2024 0845   LDLCALC 145 (H) 05/02/2024 0845   LDLCALC 68 01/04/2022 1544   LDLDIRECT 61 09/17/2018 1051      Wt Readings from Last 3 Encounters:  07/25/24 172 lb 2 oz (78.1 kg)  04/23/24 169 lb 8 oz (76.9 kg)  01/27/23 156 lb 6 oz (70.9 kg)         08/05/2016   10:11 AM  PAD Screen  Previous PAD dx? No  Previous surgical procedure? No  Pain with walking? No  Feet/toe relief with dangling? No  Painful, non-healing ulcers? No  Extremities discolored? No      ASSESSMENT AND PLAN:  1.  Coronary artery disease involving native coronary arteries with other forms of angina:  Most recent cardiac catheterization in 2023 showed no obstructive disease and improvement in the appearance of LIMA to LAD insertion site which is reassuring.   Recent episodes of chest pain in the setting of elevated blood pressure but that has resolved now since her blood pressure improved.  2. Hyperlipidemia: Recent lipid profile showed an LDL of 145 but she was not taking rosuvastatin  as prescribed.  She resumed the medication since then.  Recommended target LDL of less than 55.  3. Essential hypertension: Blood pressure improved but still not optimal.  Reviewed the note of Dr. Burt who suggested telmisartan  instead of losartan .  Thus, will switch to telmisartan -hydrochlorothiazide  80-12.5 mg once daily.  This should further improve her blood pressure and we can always consider increasing the dose of hydrochlorothiazide  to 25 mg if  needed.   Disposition:   FU with me in 6 months.  Signed,  Deatrice Cage, MD  07/25/2024 10:19 AM    [facility]

## 2024-07-25 NOTE — Patient Instructions (Addendum)
 Medication Instructions:  STOP the Losartan -Hydrochlorothiazide   START Telmisartan -Hydrochlorothiazide  80-12.5mg  once daily   *If you need a refill on your cardiac medications before your next appointment, please call your pharmacy*  Lab Work: None ordered If you have labs (blood work) drawn today and your tests are completely normal, you will receive your results only by: MyChart Message (if you have MyChart) OR A paper copy in the mail If you have any lab test that is abnormal or we need to change your treatment, we will call you to review the results.  Testing/Procedures: None ordered  Follow-Up: At [facility], you and your health needs are our priority.  As part of our continuing mission to provide you with exceptional heart care, our providers are all part of one team.  This team includes your primary Cardiologist (physician) and Advanced Practice Providers or APPs (Physician Assistants and Nurse Practitioners) who all work together to provide you with the care you need, when you need it.  Your next appointment:   6 month(s)  Provider:   You may see Deatrice Cage, MD or one of the following Advanced Practice Providers on your designated Care Team:   Lonni Meager, NP Lesley Maffucci, PA-C Bernardino Bring, PA-C Cadence Brogan, PA-C Tylene Lunch, NP Barnie Hila, NP    We recommend signing up for the patient portal called MyChart.  Sign up information is provided on this After Visit Summary.  MyChart is used to connect with patients for Virtual Visits (Telemedicine).  Patients are able to view lab/test results, encounter notes, upcoming appointments, etc.  Non-urgent messages can be sent to your provider as well.   To learn more about what you can do with MyChart, go to [email or web address].

## 2024-08-01 ENCOUNTER — Encounter: Payer: Self-pay | Admitting: Cardiovascular Disease

## 2024-08-02 MED ORDER — LOSARTAN POTASSIUM-HCTZ 100-12.5 MG PO TABS: 1.0000 | ORAL_TABLET | Freq: Every day | ORAL | 0 refills | Status: DC

## 2024-08-21 ENCOUNTER — Other Ambulatory Visit: Payer: Self-pay | Admitting: Family Medicine

## 2024-08-21 DIAGNOSIS — Z1231 Encounter for screening mammogram for malignant neoplasm of breast: Secondary | ICD-10-CM

## 2024-08-22 ENCOUNTER — Ambulatory Visit
Admission: RE | Admit: 2024-08-22 | Discharge: 2024-08-22 | Disposition: A | Discharge status: Home / Self Care | Source: Ambulatory Visit | Attending: Family Medicine | Admitting: Family Medicine

## 2024-08-22 DIAGNOSIS — Z1231 Encounter for screening mammogram for malignant neoplasm of breast: Secondary | ICD-10-CM | POA: Insufficient documentation

## 2024-09-07 ENCOUNTER — Encounter: Payer: Self-pay | Admitting: Cardiovascular Disease

## 2024-09-29 ENCOUNTER — Encounter: Payer: Self-pay | Admitting: Cardiovascular Disease

## 2024-09-30 MED ORDER — LOSARTAN POTASSIUM-HCTZ 100-12.5 MG PO TABS: 1.0000 | ORAL_TABLET | Freq: Every day | ORAL | 3 refills | Status: AC

## 2024-09-30 MED ORDER — LOSARTAN POTASSIUM-HCTZ 100-12.5 MG PO TABS: 1.0000 | ORAL_TABLET | Freq: Every day | ORAL | 3 refills | Status: DC

## 2024-09-30 NOTE — Addendum Note (Signed)
 Addended by: BRIEN SALM on: 09/30/2024 09:22 AM   Modules accepted: Orders
